# Patient Record
Sex: Male | Born: 1935 | Race: Black or African American | Hispanic: No | Smoking: Never smoker
Health system: Southern US, Community
[De-identification: ages and names within clinical notes are randomized; demographics above are authoritative.]

## PROBLEM LIST (undated history)

## (undated) DIAGNOSIS — E119 Type 2 diabetes mellitus without complications: Secondary | ICD-10-CM

## (undated) DIAGNOSIS — N179 Acute kidney failure, unspecified: Secondary | ICD-10-CM

## (undated) DIAGNOSIS — R531 Weakness: Secondary | ICD-10-CM

## (undated) DIAGNOSIS — I639 Cerebral infarction, unspecified: Secondary | ICD-10-CM

## (undated) DIAGNOSIS — E785 Hyperlipidemia, unspecified: Secondary | ICD-10-CM

## (undated) DIAGNOSIS — I10 Essential (primary) hypertension: Secondary | ICD-10-CM

## (undated) HISTORY — DX: Weakness: R53.1

## (undated) HISTORY — PX: APPENDECTOMY: SHX54

## (undated) HISTORY — DX: Hyperlipidemia, unspecified: E78.5

## (undated) HISTORY — PX: KNEE SURGERY: SHX244

---

## 1998-04-17 ENCOUNTER — Encounter: Payer: Self-pay | Admitting: Emergency Medicine

## 1998-04-17 ENCOUNTER — Emergency Department (HOSPITAL_COMMUNITY): Admission: EM | Admit: 1998-04-17 | Discharge: 1998-04-17 | Payer: Self-pay | Admitting: Emergency Medicine

## 2003-06-14 ENCOUNTER — Emergency Department (HOSPITAL_COMMUNITY): Admission: EM | Admit: 2003-06-14 | Discharge: 2003-06-14 | Payer: Self-pay | Admitting: Emergency Medicine

## 2008-02-06 ENCOUNTER — Encounter: Admission: RE | Admit: 2008-02-06 | Discharge: 2008-02-06 | Payer: Self-pay | Admitting: Physician Assistant

## 2008-02-12 ENCOUNTER — Ambulatory Visit (HOSPITAL_COMMUNITY): Admission: RE | Admit: 2008-02-12 | Discharge: 2008-02-12 | Payer: Self-pay | Admitting: Cardiovascular Disease

## 2009-07-06 ENCOUNTER — Emergency Department (HOSPITAL_COMMUNITY): Admission: EM | Admit: 2009-07-06 | Discharge: 2009-07-06 | Payer: Self-pay | Admitting: Emergency Medicine

## 2010-01-12 ENCOUNTER — Ambulatory Visit: Payer: Self-pay | Admitting: Physical Medicine & Rehabilitation

## 2010-01-15 ENCOUNTER — Inpatient Hospital Stay (HOSPITAL_COMMUNITY): Admission: EM | Admit: 2010-01-15 | Discharge: 2010-01-24 | Payer: Self-pay | Admitting: Emergency Medicine

## 2010-01-17 ENCOUNTER — Encounter (INDEPENDENT_AMBULATORY_CARE_PROVIDER_SITE_OTHER): Payer: Self-pay | Admitting: Emergency Medicine

## 2010-01-21 ENCOUNTER — Ambulatory Visit: Payer: Self-pay | Admitting: Vascular Surgery

## 2010-01-21 ENCOUNTER — Encounter (INDEPENDENT_AMBULATORY_CARE_PROVIDER_SITE_OTHER): Payer: Self-pay | Admitting: Emergency Medicine

## 2010-01-21 ENCOUNTER — Encounter (INDEPENDENT_AMBULATORY_CARE_PROVIDER_SITE_OTHER): Payer: Self-pay | Admitting: Neurology

## 2010-07-07 ENCOUNTER — Encounter
Admission: RE | Admit: 2010-07-07 | Discharge: 2010-07-11 | Payer: Self-pay | Source: Home / Self Care | Attending: Internal Medicine | Admitting: Internal Medicine

## 2010-07-11 ENCOUNTER — Encounter
Admission: RE | Admit: 2010-07-11 | Discharge: 2010-08-09 | Payer: Self-pay | Source: Home / Self Care | Attending: Internal Medicine | Admitting: Internal Medicine

## 2010-07-14 ENCOUNTER — Encounter: Admit: 2010-07-14 | Payer: Self-pay | Admitting: Internal Medicine

## 2010-07-18 ENCOUNTER — Encounter: Admit: 2010-07-18 | Payer: Self-pay | Admitting: Internal Medicine

## 2010-08-04 ENCOUNTER — Encounter: Admission: RE | Admit: 2010-08-04 | Payer: Self-pay | Source: Home / Self Care | Admitting: Internal Medicine

## 2010-08-04 ENCOUNTER — Encounter
Admission: RE | Admit: 2010-08-04 | Discharge: 2010-08-09 | Payer: Self-pay | Source: Home / Self Care | Attending: Internal Medicine | Admitting: Internal Medicine

## 2010-08-10 ENCOUNTER — Ambulatory Visit: Payer: Self-pay | Admitting: Occupational Therapy

## 2010-08-10 ENCOUNTER — Ambulatory Visit: Payer: Medicare PPO | Attending: Internal Medicine | Admitting: Occupational Therapy

## 2010-08-10 ENCOUNTER — Ambulatory Visit: Payer: Medicare PPO | Attending: Internal Medicine | Admitting: Physical Therapy

## 2010-08-10 DIAGNOSIS — IMO0001 Reserved for inherently not codable concepts without codable children: Secondary | ICD-10-CM | POA: Insufficient documentation

## 2010-08-10 DIAGNOSIS — M6281 Muscle weakness (generalized): Secondary | ICD-10-CM | POA: Insufficient documentation

## 2010-08-10 DIAGNOSIS — R279 Unspecified lack of coordination: Secondary | ICD-10-CM | POA: Insufficient documentation

## 2010-08-10 DIAGNOSIS — M25519 Pain in unspecified shoulder: Secondary | ICD-10-CM | POA: Insufficient documentation

## 2010-08-10 DIAGNOSIS — I69998 Other sequelae following unspecified cerebrovascular disease: Secondary | ICD-10-CM | POA: Insufficient documentation

## 2010-08-10 DIAGNOSIS — M2560 Stiffness of unspecified joint, not elsewhere classified: Secondary | ICD-10-CM | POA: Insufficient documentation

## 2010-08-12 ENCOUNTER — Ambulatory Visit: Payer: Medicare PPO | Admitting: Physical Therapy

## 2010-08-15 ENCOUNTER — Ambulatory Visit: Payer: Medicare PPO | Admitting: Physical Therapy

## 2010-08-17 ENCOUNTER — Encounter: Payer: Self-pay | Admitting: Occupational Therapy

## 2010-08-17 ENCOUNTER — Ambulatory Visit: Payer: Medicare PPO | Admitting: Occupational Therapy

## 2010-08-17 ENCOUNTER — Ambulatory Visit: Payer: Medicare PPO | Admitting: Physical Therapy

## 2010-08-22 ENCOUNTER — Encounter: Payer: Self-pay | Admitting: Occupational Therapy

## 2010-08-22 ENCOUNTER — Ambulatory Visit: Payer: Medicare PPO | Admitting: Physical Therapy

## 2010-08-24 ENCOUNTER — Ambulatory Visit: Payer: Medicare PPO | Admitting: Occupational Therapy

## 2010-08-24 ENCOUNTER — Encounter: Payer: Self-pay | Admitting: Occupational Therapy

## 2010-08-24 ENCOUNTER — Ambulatory Visit: Payer: Medicare PPO | Admitting: Physical Therapy

## 2010-08-26 ENCOUNTER — Ambulatory Visit: Payer: Self-pay | Admitting: Physical Therapy

## 2010-08-26 ENCOUNTER — Encounter: Payer: Self-pay | Admitting: Occupational Therapy

## 2010-08-29 ENCOUNTER — Ambulatory Visit: Payer: Medicare PPO | Admitting: Occupational Therapy

## 2010-08-29 ENCOUNTER — Encounter: Payer: Medicare PPO | Admitting: Occupational Therapy

## 2010-08-29 ENCOUNTER — Ambulatory Visit: Payer: Self-pay | Admitting: Physical Therapy

## 2010-08-29 ENCOUNTER — Ambulatory Visit: Payer: Medicare PPO | Admitting: Physical Therapy

## 2010-08-29 ENCOUNTER — Encounter: Payer: Self-pay | Admitting: Occupational Therapy

## 2010-08-31 ENCOUNTER — Ambulatory Visit: Payer: Medicare PPO | Attending: Internal Medicine | Admitting: Physical Therapy

## 2010-08-31 ENCOUNTER — Encounter: Payer: Self-pay | Admitting: Occupational Therapy

## 2010-08-31 ENCOUNTER — Ambulatory Visit: Payer: Medicare PPO | Admitting: Occupational Therapy

## 2010-08-31 DIAGNOSIS — M6281 Muscle weakness (generalized): Secondary | ICD-10-CM | POA: Insufficient documentation

## 2010-08-31 DIAGNOSIS — I69998 Other sequelae following unspecified cerebrovascular disease: Secondary | ICD-10-CM | POA: Insufficient documentation

## 2010-08-31 DIAGNOSIS — M25519 Pain in unspecified shoulder: Secondary | ICD-10-CM | POA: Insufficient documentation

## 2010-08-31 DIAGNOSIS — IMO0001 Reserved for inherently not codable concepts without codable children: Secondary | ICD-10-CM | POA: Insufficient documentation

## 2010-08-31 DIAGNOSIS — M2560 Stiffness of unspecified joint, not elsewhere classified: Secondary | ICD-10-CM | POA: Insufficient documentation

## 2010-08-31 DIAGNOSIS — R279 Unspecified lack of coordination: Secondary | ICD-10-CM | POA: Insufficient documentation

## 2010-09-06 ENCOUNTER — Ambulatory Visit: Payer: Medicare PPO | Admitting: Occupational Therapy

## 2010-09-06 ENCOUNTER — Ambulatory Visit: Payer: Medicare PPO | Admitting: Physical Therapy

## 2010-09-08 ENCOUNTER — Ambulatory Visit: Payer: Medicare PPO | Attending: Internal Medicine | Admitting: Occupational Therapy

## 2010-09-08 ENCOUNTER — Ambulatory Visit: Payer: Medicare PPO | Admitting: Physical Therapy

## 2010-09-08 DIAGNOSIS — IMO0001 Reserved for inherently not codable concepts without codable children: Secondary | ICD-10-CM | POA: Insufficient documentation

## 2010-09-08 DIAGNOSIS — I69998 Other sequelae following unspecified cerebrovascular disease: Secondary | ICD-10-CM | POA: Insufficient documentation

## 2010-09-08 DIAGNOSIS — M2560 Stiffness of unspecified joint, not elsewhere classified: Secondary | ICD-10-CM | POA: Insufficient documentation

## 2010-09-08 DIAGNOSIS — M25519 Pain in unspecified shoulder: Secondary | ICD-10-CM | POA: Insufficient documentation

## 2010-09-08 DIAGNOSIS — R279 Unspecified lack of coordination: Secondary | ICD-10-CM | POA: Insufficient documentation

## 2010-09-08 DIAGNOSIS — M6281 Muscle weakness (generalized): Secondary | ICD-10-CM | POA: Insufficient documentation

## 2010-09-13 ENCOUNTER — Ambulatory Visit: Payer: Medicare PPO | Admitting: Physical Therapy

## 2010-09-13 ENCOUNTER — Ambulatory Visit: Payer: Medicare PPO | Admitting: Occupational Therapy

## 2010-09-15 ENCOUNTER — Ambulatory Visit: Payer: Medicare PPO | Admitting: Physical Therapy

## 2010-09-15 ENCOUNTER — Ambulatory Visit: Payer: Medicare PPO | Admitting: Occupational Therapy

## 2010-09-20 ENCOUNTER — Ambulatory Visit: Payer: Medicare PPO | Admitting: Occupational Therapy

## 2010-09-20 ENCOUNTER — Ambulatory Visit: Payer: Medicare PPO | Admitting: Physical Therapy

## 2010-09-23 ENCOUNTER — Ambulatory Visit: Payer: Medicare PPO | Admitting: Physical Therapy

## 2010-09-23 ENCOUNTER — Ambulatory Visit: Payer: Medicare PPO | Admitting: Occupational Therapy

## 2010-09-24 LAB — GLUCOSE, CAPILLARY
Glucose-Capillary: 119 mg/dL — ABNORMAL HIGH (ref 70–99)
Glucose-Capillary: 124 mg/dL — ABNORMAL HIGH (ref 70–99)
Glucose-Capillary: 125 mg/dL — ABNORMAL HIGH (ref 70–99)
Glucose-Capillary: 149 mg/dL — ABNORMAL HIGH (ref 70–99)
Glucose-Capillary: 170 mg/dL — ABNORMAL HIGH (ref 70–99)
Glucose-Capillary: 82 mg/dL (ref 70–99)
Glucose-Capillary: 96 mg/dL (ref 70–99)
Glucose-Capillary: 97 mg/dL (ref 70–99)

## 2010-09-24 LAB — BASIC METABOLIC PANEL
BUN: 14 mg/dL (ref 6–23)
Calcium: 9.1 mg/dL (ref 8.4–10.5)
GFR calc non Af Amer: 51 mL/min — ABNORMAL LOW (ref >60)
Glucose, Bld: 96 mg/dL (ref 70–99)

## 2010-09-25 LAB — COMPREHENSIVE METABOLIC PANEL
Albumin: 3.4 g/dL — ABNORMAL LOW (ref 3.5–5.2)
Alkaline Phosphatase: 37 U/L — ABNORMAL LOW (ref 39–117)
BUN: 18 mg/dL (ref 6–23)
CO2: 28 mEq/L (ref 19–32)
Chloride: 106 mEq/L (ref 96–112)
GFR calc non Af Amer: 49 mL/min — ABNORMAL LOW (ref >60)
Potassium: 4.1 mEq/L (ref 3.5–5.1)
Total Bilirubin: 1.3 mg/dL — ABNORMAL HIGH (ref 0.3–1.2)

## 2010-09-25 LAB — CBC
HCT: 38.6 % — ABNORMAL LOW (ref 39.0–52.0)
HCT: 39.4 % (ref 39.0–52.0)
HCT: 42.7 % (ref 39.0–52.0)
Hemoglobin: 13.2 g/dL (ref 13.0–17.0)
MCH: 30.9 pg (ref 26.0–34.0)
MCV: 91.9 fL (ref 78.0–100.0)
MCV: 92.1 fL (ref 78.0–100.0)
MCV: 92.2 fL (ref 78.0–100.0)
Platelets: 156 10*3/uL (ref 150–400)
Platelets: 160 10*3/uL (ref 150–400)
Platelets: 161 10*3/uL (ref 150–400)
RBC: 4.27 MIL/uL (ref 4.22–5.81)
RBC: 4.45 MIL/uL (ref 4.22–5.81)
RDW: 13.7 % (ref 11.5–15.5)
RDW: 13.8 % (ref 11.5–15.5)
WBC: 11.4 10*3/uL — ABNORMAL HIGH (ref 4.0–10.5)
WBC: 8.2 10*3/uL (ref 4.0–10.5)
WBC: 9.4 10*3/uL (ref 4.0–10.5)

## 2010-09-25 LAB — CK
Total CK: 1059 U/L — ABNORMAL HIGH (ref 7–232)
Total CK: 493 U/L — ABNORMAL HIGH (ref 7–232)
Total CK: 845 U/L — ABNORMAL HIGH (ref 7–232)

## 2010-09-25 LAB — BASIC METABOLIC PANEL
BUN: 13 mg/dL (ref 6–23)
CO2: 29 mEq/L (ref 19–32)
Calcium: 8.9 mg/dL (ref 8.4–10.5)
Chloride: 107 mEq/L (ref 96–112)
Creatinine, Ser: 1.34 mg/dL (ref 0.4–1.5)
Creatinine, Ser: 1.55 mg/dL — ABNORMAL HIGH (ref 0.4–1.5)
GFR calc Af Amer: >60 mL/min (ref >60)
GFR calc non Af Amer: 43 mL/min — ABNORMAL LOW (ref >60)
GFR calc non Af Amer: 51 mL/min — ABNORMAL LOW (ref >60)
Potassium: 3.9 mEq/L (ref 3.5–5.1)
Potassium: 4.1 mEq/L (ref 3.5–5.1)
Sodium: 138 mEq/L (ref 135–145)

## 2010-09-25 LAB — URINALYSIS, ROUTINE W REFLEX MICROSCOPIC
Glucose, UA: NEGATIVE mg/dL
Nitrite: NEGATIVE
Nitrite: NEGATIVE
Specific Gravity, Urine: 1.016 (ref 1.005–1.030)
Specific Gravity, Urine: 1.021 (ref 1.005–1.030)
Urobilinogen, UA: 1 mg/dL (ref 0.0–1.0)
pH: 6.5 (ref 5.0–8.0)
pH: 8.5 — ABNORMAL HIGH (ref 5.0–8.0)

## 2010-09-25 LAB — GLUCOSE, CAPILLARY
Glucose-Capillary: 108 mg/dL — ABNORMAL HIGH (ref 70–99)
Glucose-Capillary: 111 mg/dL — ABNORMAL HIGH (ref 70–99)
Glucose-Capillary: 116 mg/dL — ABNORMAL HIGH (ref 70–99)
Glucose-Capillary: 136 mg/dL — ABNORMAL HIGH (ref 70–99)
Glucose-Capillary: 143 mg/dL — ABNORMAL HIGH (ref 70–99)
Glucose-Capillary: 144 mg/dL — ABNORMAL HIGH (ref 70–99)
Glucose-Capillary: 147 mg/dL — ABNORMAL HIGH (ref 70–99)
Glucose-Capillary: 165 mg/dL — ABNORMAL HIGH (ref 70–99)
Glucose-Capillary: 81 mg/dL (ref 70–99)
Glucose-Capillary: 82 mg/dL (ref 70–99)
Glucose-Capillary: 94 mg/dL (ref 70–99)
Glucose-Capillary: 99 mg/dL (ref 70–99)

## 2010-09-25 LAB — HEMOGLOBIN A1C
Mean Plasma Glucose: 123 mg/dL — ABNORMAL HIGH (ref <117)
Mean Plasma Glucose: 123 mg/dL — ABNORMAL HIGH (ref <117)

## 2010-09-25 LAB — URINE MICROSCOPIC-ADD ON

## 2010-09-25 LAB — URINE CULTURE
Colony Count: NO GROWTH
Culture: NO GROWTH

## 2010-09-25 LAB — LIPID PANEL: VLDL: 11 mg/dL (ref 0–40)

## 2010-09-26 ENCOUNTER — Ambulatory Visit: Payer: Medicare PPO | Admitting: Occupational Therapy

## 2010-09-26 ENCOUNTER — Ambulatory Visit: Payer: Medicare PPO | Admitting: Physical Therapy

## 2010-09-28 ENCOUNTER — Encounter: Payer: Medicare PPO | Admitting: Occupational Therapy

## 2010-09-28 ENCOUNTER — Ambulatory Visit: Payer: Medicare PPO | Admitting: Physical Therapy

## 2010-11-22 NOTE — Cardiovascular Report (Signed)
NAME:  Mark Bullock, Mark Bullock NO.:  [id number]   MEDICAL RECORD NO.:  [id number]          PATIENT TYPE:  OIB   LOCATION:  2899                         FACILITY:  [facility]   PHYSICIAN:  Nanetta Batty, M.D.   DATE OF BIRTH:  01/26/1929   DATE OF PROCEDURE:  DATE OF DISCHARGE:  02/12/2008                            CARDIAC CATHETERIZATION   [name] is a 77 year old mildly overweight African American male  referred to me because of an abnormal EKG.  He had an episode of chest  pain in the past.  His risk factors are positive for diabetes and  hypertension.  He had a Myoview that showed inferior scar with low EF.  Because of this fever, he presents for right and left heart cath to  define his anatomy, physiology and rule out ischemic etiology.   DESCRIPTION OF PROCEDURE:  The patient was brought to the Second Floor  [facility] Cardiac Cath Lab in postabsorptive state.  He was not  premedicated.  His right groin was prepped and shaved in the usual  sterile fashion.  Xylocaine 1% was used for local anesthesia.  A 7-  French sheath was inserted into the right femoral vein using standard  Seldinger technique.  A 6-French sheath was inserted into the right  femoral artery.  A 7-French balloon-tip thermodilution Swan-Ganz  catheter was used to obtain sequential right heart pressures.  A 6-  French right and left Judkins diagnostic catheter as well as 6-French  pigtail catheter were used for selective coronary angiography and left  ventriculography respectively.  Visipaque dye was used for the entirety  of the case.  Retrograde aorta, left ventricular and pullback pressures  were recorded.   HEMODYNAMICS:  1. Aortic systolic pressure 105, diastolic pressure 68.  2. Left ventricular systolic pressure was 9, end-diastolic pressure      11.  3. RA pressure, A-wave 6, V-wave 5, mean 3.  RV pressure, systolic 21,      end-diastolic pressure 1.  Pulmonary artery pressure,  systolic 25,      diastolic pressure 16, mean 21.  Pulmonary capillary wedge pressure      A-wave 17, V-wave 15, mean 15.  4. Selective coronary angiography.      a.     Left main normal.      b.     LAD normal.      c.     Left circumflex normal.      d.     Ramus intermedius branch normal.      e.     Right coronary artery was dominant normal.  5. Left ventriculography; RAO left ventriculogram was performed using      25 mL of Visipaque dye at 12 mL per second.  The overall LVEF was      estimated greater than 55% without focal wall motion abnormalities.   IMPRESSION:  [name] has essentially normal coronary arteries,  normal LV function, and normal filling pressures.  I believe his Myoview  is false positive and his abnormal EKG was a normal variant.  He has no  physiologic reason to have lower  extremity edema.  This should be  treated medically.  Sheaths were removed and  pressure was held on the groin to achieve hemostasis.  The patient left  the lab in stable condition.  He will be gently hydrated, discharged  home later today as an outpatient and will see me back in the office in  1 or 2 weeks for followup.  He left the lab in stable condition.      Nanetta Batty, M.D.  Electronically Signed     JB/MEDQ  D:  02/12/2008  T:  02/13/2008  Job:  [id number]   cc:   Second Floor [place] [facility]iac Cath Lab  [facility]  Fleet Contras, M.D.

## 2011-04-07 LAB — BASIC METABOLIC PANEL
Calcium: 9.4
Creatinine, Ser: 1.39
GFR calc Af Amer: 60 — ABNORMAL LOW

## 2011-09-05 ENCOUNTER — Other Ambulatory Visit (HOSPITAL_COMMUNITY): Payer: Self-pay | Admitting: General Surgery

## 2011-09-05 DIAGNOSIS — N433 Hydrocele, unspecified: Secondary | ICD-10-CM

## 2011-09-11 ENCOUNTER — Ambulatory Visit (HOSPITAL_COMMUNITY)
Admission: RE | Admit: 2011-09-11 | Discharge: 2011-09-11 | Disposition: A | Discharge status: Home / Self Care | Payer: Medicare PPO | Source: Ambulatory Visit | Attending: General Surgery | Admitting: General Surgery

## 2011-09-11 DIAGNOSIS — N433 Hydrocele, unspecified: Secondary | ICD-10-CM | POA: Insufficient documentation

## 2012-07-24 ENCOUNTER — Other Ambulatory Visit: Payer: Self-pay | Admitting: Internal Medicine

## 2012-07-24 ENCOUNTER — Ambulatory Visit
Admission: RE | Admit: 2012-07-24 | Discharge: 2012-07-24 | Disposition: A | Discharge status: Home / Self Care | Payer: Medicare PPO | Source: Ambulatory Visit | Attending: Internal Medicine | Admitting: Internal Medicine

## 2012-07-24 DIAGNOSIS — R52 Pain, unspecified: Secondary | ICD-10-CM

## 2013-09-01 ENCOUNTER — Other Ambulatory Visit: Payer: Self-pay | Admitting: Adult Health

## 2013-09-08 ENCOUNTER — Other Ambulatory Visit: Payer: Self-pay | Admitting: Adult Health

## 2013-10-09 ENCOUNTER — Other Ambulatory Visit: Payer: Self-pay | Admitting: Adult Health

## 2013-10-20 ENCOUNTER — Other Ambulatory Visit: Payer: Self-pay | Admitting: Adult Health

## 2013-12-06 ENCOUNTER — Other Ambulatory Visit: Payer: Self-pay | Admitting: Adult Health

## 2014-07-23 ENCOUNTER — Other Ambulatory Visit: Payer: Self-pay | Admitting: Internal Medicine

## 2014-07-23 ENCOUNTER — Ambulatory Visit
Admission: RE | Admit: 2014-07-23 | Discharge: 2014-07-23 | Disposition: A | Discharge status: Home / Self Care | Payer: Commercial Managed Care - HMO | Source: Ambulatory Visit | Attending: Internal Medicine | Admitting: Internal Medicine

## 2014-07-23 DIAGNOSIS — R059 Cough, unspecified: Secondary | ICD-10-CM

## 2014-07-23 DIAGNOSIS — R05 Cough: Secondary | ICD-10-CM

## 2015-03-04 ENCOUNTER — Emergency Department (HOSPITAL_COMMUNITY)
Admission: EM | Admit: 2015-03-04 | Discharge: 2015-03-04 | Disposition: A | Discharge status: Home / Self Care | Payer: Medicare HMO | Attending: Emergency Medicine | Admitting: Emergency Medicine

## 2015-03-04 ENCOUNTER — Encounter (HOSPITAL_COMMUNITY): Payer: Self-pay | Admitting: Family Medicine

## 2015-03-04 DIAGNOSIS — L97919 Non-pressure chronic ulcer of unspecified part of right lower leg with unspecified severity: Secondary | ICD-10-CM

## 2015-03-04 DIAGNOSIS — L97829 Non-pressure chronic ulcer of other part of left lower leg with unspecified severity: Secondary | ICD-10-CM | POA: Insufficient documentation

## 2015-03-04 DIAGNOSIS — L97819 Non-pressure chronic ulcer of other part of right lower leg with unspecified severity: Secondary | ICD-10-CM | POA: Insufficient documentation

## 2015-03-04 DIAGNOSIS — Z8673 Personal history of transient ischemic attack (TIA), and cerebral infarction without residual deficits: Secondary | ICD-10-CM | POA: Insufficient documentation

## 2015-03-04 DIAGNOSIS — R2243 Localized swelling, mass and lump, lower limb, bilateral: Secondary | ICD-10-CM | POA: Diagnosis present

## 2015-03-04 DIAGNOSIS — E119 Type 2 diabetes mellitus without complications: Secondary | ICD-10-CM | POA: Insufficient documentation

## 2015-03-04 DIAGNOSIS — I10 Essential (primary) hypertension: Secondary | ICD-10-CM | POA: Insufficient documentation

## 2015-03-04 DIAGNOSIS — I83008 Varicose veins of unspecified lower extremity with ulcer other part of lower leg: Secondary | ICD-10-CM | POA: Insufficient documentation

## 2015-03-04 DIAGNOSIS — I83019 Varicose veins of right lower extremity with ulcer of unspecified site: Secondary | ICD-10-CM

## 2015-03-04 DIAGNOSIS — I83029 Varicose veins of left lower extremity with ulcer of unspecified site: Secondary | ICD-10-CM

## 2015-03-04 DIAGNOSIS — Z7902 Long term (current) use of antithrombotics/antiplatelets: Secondary | ICD-10-CM | POA: Insufficient documentation

## 2015-03-04 DIAGNOSIS — Z79899 Other long term (current) drug therapy: Secondary | ICD-10-CM | POA: Insufficient documentation

## 2015-03-04 DIAGNOSIS — L97929 Non-pressure chronic ulcer of unspecified part of left lower leg with unspecified severity: Secondary | ICD-10-CM

## 2015-03-04 HISTORY — DX: Type 2 diabetes mellitus without complications: E11.9

## 2015-03-04 HISTORY — DX: Essential (primary) hypertension: I10

## 2015-03-04 HISTORY — DX: Cerebral infarction, unspecified: I63.9

## 2015-03-04 MED ORDER — DOXYCYCLINE HYCLATE 100 MG PO CAPS: 100.0000 mg | ORAL_CAPSULE | Freq: Two times a day (BID) | ORAL | Status: DC

## 2015-03-04 MED ORDER — DOXYCYCLINE HYCLATE 100 MG PO TABS
100.0000 mg | ORAL_TABLET | Freq: Once | ORAL | Status: AC
  Administered 2015-03-04: 100 mg via ORAL
  Filled 2015-03-04: qty 1

## 2015-03-04 NOTE — ED Notes (Signed)
Pt here for right leg swelling and wound to leg.

## 2015-03-04 NOTE — ED Provider Notes (Signed)
CSN: [id number]     Arrival date & time 03/04/15  0917 History   First MD Initiated Contact with Patient 03/04/15 0957     Chief Complaint  Patient presents with  . Leg Swelling     (Consider location/radiation/quality/duration/timing/severity/associated sxs/prior Treatment) HPI.... Patient presents with swelling and inflammation of his bilateral legs for several months, right greater than left. Both legs are erythematous and scaly. The right leg has an ulceration and some weeping. No fever or chills. This is a long-standing problem. He has a primary care doctor, Dr. Burton Apley. Severity is moderate.  Past Medical History  Diagnosis Date  . Diabetes mellitus without complication   . Hypertension   . Stroke    History reviewed. No pertinent past surgical history. History reviewed. No pertinent family history. Social History  Substance Use Topics  . Smoking status: Never Smoker   . Smokeless tobacco: None  . Alcohol Use: No    Review of Systems    Allergies  Review of patient's allergies indicates no known allergies.  Home Medications   Prior to Admission medications   Medication Sig Start Date End Date Taking? Authorizing Provider  clopidogrel (PLAVIX) 75 MG tablet Take 75 mg by mouth daily. 01/22/15  Yes Historical Provider, MD  furosemide (LASIX) 40 MG tablet Take 40 mg by mouth daily. 01/22/15  Yes Historical Provider, MD  glimepiride (AMARYL) 1 MG tablet Take 1 mg by mouth daily. 01/22/15  Yes Historical Provider, MD  KLOR-CON M10 10 MEQ tablet Take 10 mEq by mouth daily. 01/22/15  Yes Historical Provider, MD  lisinopril (PRINIVIL,ZESTRIL) 5 MG tablet Take 5 mg by mouth daily. 01/22/15  Yes Historical Provider, MD  simvastatin (ZOCOR) 20 MG tablet Take 20 mg by mouth daily. 01/22/15  Yes Historical Provider, MD  vitamin B-12 (CYANOCOBALAMIN) 100 MCG tablet Take 100 mcg by mouth daily.   Yes Historical Provider, MD  doxycycline (VIBRAMYCIN) 100 MG capsule Take 1 capsule  (100 mg total) by mouth 2 (two) times daily. 03/04/15   Donnetta Hutching, MD   BP 129/79 mmHg  Pulse 76  Temp(Src) 97.9 F (36.6 C) (Oral)  Resp 18  SpO2 97% Physical Exam  Constitutional: He is oriented to person, place, and time. He appears well-developed and well-nourished.  HENT:  Head: Normocephalic and atraumatic.  Eyes: Conjunctivae and EOM are normal. Pupils are equal, round, and reactive to light.  Neck: Normal range of motion. Neck supple.  Cardiovascular: Normal rate and regular rhythm.   Pulmonary/Chest: Effort normal and breath sounds normal.  Abdominal: Soft. Bowel sounds are normal.  Musculoskeletal: Normal range of motion.  Neurological: He is alert and oriented to person, place, and time.  Skin:  Bilateral lower extremities: Legs are puffy, erythematous, scaly with weeping of the right leg. There is an ulceration on the right posterior medial mid calf area approximately 2.5 cm in diameter  Psychiatric: He has a normal mood and affect. His behavior is normal.  Nursing note and vitals reviewed.   ED Course  Procedures (including critical care time) Labs Review Labs Reviewed - No data to display  Imaging Review No results found. I have personally reviewed and evaluated these images and lab results as part of my medical decision-making.   EKG Interpretation None      MDM   Final diagnoses:  Venous stasis ulcers of both lower extremities    Patient has obvious venous stasis ulcers. There may be a small cellulitic component. Rx doxycycline 100 mg twice a  day. I made an appointment for the patient at the [facility] wound care center for September 12 at 9 AM    Donnetta Hutching, MD 03/04/15 1112

## 2015-03-04 NOTE — Discharge Instructions (Signed)
Stasis Ulcer  A stasis ulcer is a sore on the skin. It occurs in the legs when your blood flow is damaged.  HOME CARE  Do not stand or sit in one position for a long time. Do not sit with your legs crossed. Raise (elevate) your legs.  Wear elastic stockings (compression stockings). Do not wear tight clothing around the legs or waist area. Use and apply bandages (dressings) as told.  Walk as much as possible. If you take long car or plane rides, take a break to walk around every 2 hours.  Only take medicine as told by your doctor.  Raise the end of your bed with 2-inch blocks only if your doctor says it is okay.  If you cut the skin on your leg, lie down and raise your leg. Gently clean the area with a clean cloth. Then, put pressure on the cut until the bleeding stops. Put a bandage on.  Keep all doctor visits. GET HELP RIGHT AWAY IF:  The ulcer area starts to break down.  You have pain, redness, or tenderness in or around the ulcer.  You have yellowish-white fluid (pus) or hard puffiness (swelling) in or around the ulcer.  Your pain gets worse.  You get a fever.  You have chest pain or shortness of breath. MAKE SURE YOU:  Understand these instructions.  Will watch your condition.  Will get help right away if you are not doing well or get worse. Document Released: 08/03/2004 Document Revised: 10/21/2012 Document Reviewed: 10/25/2010 [facility] Patient Information 2015 [place], [place]. This information is not intended to replace advice given to you by your health care provider. Make sure you discuss any questions you have with your health care provider.   You have a condition called venous stasis ulcers. Prescription for antibiotic. I've made an appointment for you at the Wound Care Center at [facility] for September 12. Please call the number provided to confirm your appointment.

## 2015-03-22 ENCOUNTER — Encounter (HOSPITAL_BASED_OUTPATIENT_CLINIC_OR_DEPARTMENT_OTHER): Payer: Medicare HMO | Attending: Plastic Surgery

## 2015-03-22 ENCOUNTER — Other Ambulatory Visit (HOSPITAL_COMMUNITY)
Admission: RE | Admit: 2015-03-22 | Discharge: 2015-03-22 | Disposition: A | Discharge status: Home / Self Care | Payer: Commercial Managed Care - HMO | Source: Ambulatory Visit | Attending: Plastic Surgery | Admitting: Plastic Surgery

## 2015-03-22 DIAGNOSIS — I872 Venous insufficiency (chronic) (peripheral): Secondary | ICD-10-CM | POA: Diagnosis not present

## 2015-03-22 DIAGNOSIS — I10 Essential (primary) hypertension: Secondary | ICD-10-CM | POA: Insufficient documentation

## 2015-03-22 DIAGNOSIS — E1141 Type 2 diabetes mellitus with diabetic mononeuropathy: Secondary | ICD-10-CM | POA: Diagnosis not present

## 2015-03-22 DIAGNOSIS — Z0289 Encounter for other administrative examinations: Secondary | ICD-10-CM | POA: Diagnosis present

## 2015-03-22 DIAGNOSIS — L97211 Non-pressure chronic ulcer of right calf limited to breakdown of skin: Secondary | ICD-10-CM | POA: Diagnosis present

## 2015-03-22 DIAGNOSIS — I890 Lymphedema, not elsewhere classified: Secondary | ICD-10-CM | POA: Diagnosis not present

## 2015-03-22 LAB — PREALBUMIN: PREALBUMIN: 30.2 mg/dL (ref 18–38)

## 2015-03-23 LAB — HEMOGLOBIN A1C
Hgb A1c MFr Bld: 5.7 % — ABNORMAL HIGH (ref 4.8–5.6)
Mean Plasma Glucose: 117 mg/dL

## 2015-03-29 DIAGNOSIS — I872 Venous insufficiency (chronic) (peripheral): Secondary | ICD-10-CM | POA: Diagnosis not present

## 2015-03-29 DIAGNOSIS — E1141 Type 2 diabetes mellitus with diabetic mononeuropathy: Secondary | ICD-10-CM | POA: Diagnosis not present

## 2015-03-29 DIAGNOSIS — L97211 Non-pressure chronic ulcer of right calf limited to breakdown of skin: Secondary | ICD-10-CM | POA: Diagnosis not present

## 2015-03-29 DIAGNOSIS — I890 Lymphedema, not elsewhere classified: Secondary | ICD-10-CM | POA: Diagnosis not present

## 2015-04-05 DIAGNOSIS — L97211 Non-pressure chronic ulcer of right calf limited to breakdown of skin: Secondary | ICD-10-CM | POA: Diagnosis not present

## 2015-04-05 DIAGNOSIS — I872 Venous insufficiency (chronic) (peripheral): Secondary | ICD-10-CM | POA: Diagnosis not present

## 2015-04-05 DIAGNOSIS — I890 Lymphedema, not elsewhere classified: Secondary | ICD-10-CM | POA: Diagnosis not present

## 2015-04-05 DIAGNOSIS — E1141 Type 2 diabetes mellitus with diabetic mononeuropathy: Secondary | ICD-10-CM | POA: Diagnosis not present

## 2015-04-07 ENCOUNTER — Encounter (HOSPITAL_COMMUNITY): Payer: Commercial Managed Care - HMO

## 2015-04-12 ENCOUNTER — Encounter (HOSPITAL_BASED_OUTPATIENT_CLINIC_OR_DEPARTMENT_OTHER): Payer: Medicare HMO | Attending: Plastic Surgery

## 2015-04-12 DIAGNOSIS — E1151 Type 2 diabetes mellitus with diabetic peripheral angiopathy without gangrene: Secondary | ICD-10-CM | POA: Insufficient documentation

## 2015-04-12 DIAGNOSIS — E11622 Type 2 diabetes mellitus with other skin ulcer: Secondary | ICD-10-CM | POA: Diagnosis present

## 2015-04-12 DIAGNOSIS — Z8673 Personal history of transient ischemic attack (TIA), and cerebral infarction without residual deficits: Secondary | ICD-10-CM | POA: Insufficient documentation

## 2015-04-12 DIAGNOSIS — I890 Lymphedema, not elsewhere classified: Secondary | ICD-10-CM | POA: Diagnosis not present

## 2015-04-12 DIAGNOSIS — I10 Essential (primary) hypertension: Secondary | ICD-10-CM | POA: Diagnosis not present

## 2015-04-12 DIAGNOSIS — L97811 Non-pressure chronic ulcer of other part of right lower leg limited to breakdown of skin: Secondary | ICD-10-CM | POA: Insufficient documentation

## 2015-04-19 ENCOUNTER — Ambulatory Visit (HOSPITAL_COMMUNITY)
Admission: RE | Admit: 2015-04-19 | Discharge: 2015-04-19 | Disposition: A | Discharge status: Home / Self Care | Payer: Medicare HMO | Source: Ambulatory Visit | Attending: Surgery | Admitting: Surgery

## 2015-04-19 ENCOUNTER — Other Ambulatory Visit (HOSPITAL_COMMUNITY): Payer: Self-pay | Admitting: Plastic Surgery

## 2015-04-19 ENCOUNTER — Ambulatory Visit (INDEPENDENT_AMBULATORY_CARE_PROVIDER_SITE_OTHER)
Admission: RE | Admit: 2015-04-19 | Discharge: 2015-04-19 | Disposition: A | Discharge status: Home / Self Care | Payer: Medicare HMO | Source: Ambulatory Visit | Attending: Surgery | Admitting: Surgery

## 2015-04-19 ENCOUNTER — Encounter (HOSPITAL_BASED_OUTPATIENT_CLINIC_OR_DEPARTMENT_OTHER): Payer: Medicare HMO

## 2015-04-19 DIAGNOSIS — L97211 Non-pressure chronic ulcer of right calf limited to breakdown of skin: Secondary | ICD-10-CM

## 2015-04-19 DIAGNOSIS — I872 Venous insufficiency (chronic) (peripheral): Secondary | ICD-10-CM

## 2015-04-19 DIAGNOSIS — E11622 Type 2 diabetes mellitus with other skin ulcer: Secondary | ICD-10-CM | POA: Insufficient documentation

## 2015-04-19 DIAGNOSIS — I8391 Asymptomatic varicose veins of right lower extremity: Secondary | ICD-10-CM | POA: Diagnosis not present

## 2015-04-19 DIAGNOSIS — I10 Essential (primary) hypertension: Secondary | ICD-10-CM | POA: Diagnosis not present

## 2015-04-19 DIAGNOSIS — L97811 Non-pressure chronic ulcer of other part of right lower leg limited to breakdown of skin: Secondary | ICD-10-CM | POA: Diagnosis not present

## 2015-04-19 DIAGNOSIS — Z8673 Personal history of transient ischemic attack (TIA), and cerebral infarction without residual deficits: Secondary | ICD-10-CM | POA: Diagnosis not present

## 2015-04-19 DIAGNOSIS — E1151 Type 2 diabetes mellitus with diabetic peripheral angiopathy without gangrene: Secondary | ICD-10-CM | POA: Diagnosis not present

## 2015-05-20 ENCOUNTER — Telehealth (HOSPITAL_BASED_OUTPATIENT_CLINIC_OR_DEPARTMENT_OTHER): Payer: Self-pay | Admitting: *Deleted

## 2016-05-09 ENCOUNTER — Encounter (HOSPITAL_BASED_OUTPATIENT_CLINIC_OR_DEPARTMENT_OTHER): Payer: Medicare HMO | Attending: Surgery

## 2016-05-09 DIAGNOSIS — I890 Lymphedema, not elsewhere classified: Secondary | ICD-10-CM | POA: Insufficient documentation

## 2016-05-09 DIAGNOSIS — Z79899 Other long term (current) drug therapy: Secondary | ICD-10-CM | POA: Diagnosis not present

## 2016-05-09 DIAGNOSIS — L97821 Non-pressure chronic ulcer of other part of left lower leg limited to breakdown of skin: Secondary | ICD-10-CM | POA: Insufficient documentation

## 2016-05-09 DIAGNOSIS — L97811 Non-pressure chronic ulcer of other part of right lower leg limited to breakdown of skin: Secondary | ICD-10-CM | POA: Insufficient documentation

## 2016-05-09 DIAGNOSIS — Z7902 Long term (current) use of antithrombotics/antiplatelets: Secondary | ICD-10-CM | POA: Diagnosis not present

## 2016-05-09 DIAGNOSIS — Z8673 Personal history of transient ischemic attack (TIA), and cerebral infarction without residual deficits: Secondary | ICD-10-CM | POA: Diagnosis not present

## 2016-05-09 DIAGNOSIS — Z7984 Long term (current) use of oral hypoglycemic drugs: Secondary | ICD-10-CM | POA: Insufficient documentation

## 2016-05-09 DIAGNOSIS — E11622 Type 2 diabetes mellitus with other skin ulcer: Secondary | ICD-10-CM | POA: Diagnosis not present

## 2016-05-09 DIAGNOSIS — I10 Essential (primary) hypertension: Secondary | ICD-10-CM | POA: Diagnosis not present

## 2016-05-09 DIAGNOSIS — E538 Deficiency of other specified B group vitamins: Secondary | ICD-10-CM | POA: Diagnosis not present

## 2016-05-16 ENCOUNTER — Encounter (HOSPITAL_BASED_OUTPATIENT_CLINIC_OR_DEPARTMENT_OTHER): Payer: Medicare HMO | Attending: Surgery

## 2016-05-16 DIAGNOSIS — L97222 Non-pressure chronic ulcer of left calf with fat layer exposed: Secondary | ICD-10-CM | POA: Diagnosis not present

## 2016-05-16 DIAGNOSIS — I890 Lymphedema, not elsewhere classified: Secondary | ICD-10-CM | POA: Diagnosis not present

## 2016-05-16 DIAGNOSIS — I10 Essential (primary) hypertension: Secondary | ICD-10-CM | POA: Diagnosis not present

## 2016-05-16 DIAGNOSIS — L97212 Non-pressure chronic ulcer of right calf with fat layer exposed: Secondary | ICD-10-CM | POA: Diagnosis not present

## 2016-05-16 DIAGNOSIS — E11622 Type 2 diabetes mellitus with other skin ulcer: Secondary | ICD-10-CM | POA: Diagnosis not present

## 2016-05-16 DIAGNOSIS — I87333 Chronic venous hypertension (idiopathic) with ulcer and inflammation of bilateral lower extremity: Secondary | ICD-10-CM | POA: Insufficient documentation

## 2016-05-16 DIAGNOSIS — Z8673 Personal history of transient ischemic attack (TIA), and cerebral infarction without residual deficits: Secondary | ICD-10-CM | POA: Diagnosis not present

## 2016-05-23 DIAGNOSIS — E11622 Type 2 diabetes mellitus with other skin ulcer: Secondary | ICD-10-CM | POA: Diagnosis not present

## 2016-05-30 DIAGNOSIS — E11622 Type 2 diabetes mellitus with other skin ulcer: Secondary | ICD-10-CM | POA: Diagnosis not present

## 2016-06-13 ENCOUNTER — Encounter (HOSPITAL_BASED_OUTPATIENT_CLINIC_OR_DEPARTMENT_OTHER): Payer: Medicare HMO | Attending: Surgery

## 2016-06-13 DIAGNOSIS — L97821 Non-pressure chronic ulcer of other part of left lower leg limited to breakdown of skin: Secondary | ICD-10-CM | POA: Diagnosis not present

## 2016-06-13 DIAGNOSIS — I10 Essential (primary) hypertension: Secondary | ICD-10-CM | POA: Insufficient documentation

## 2016-06-13 DIAGNOSIS — I890 Lymphedema, not elsewhere classified: Secondary | ICD-10-CM | POA: Insufficient documentation

## 2016-06-13 DIAGNOSIS — I87333 Chronic venous hypertension (idiopathic) with ulcer and inflammation of bilateral lower extremity: Secondary | ICD-10-CM | POA: Insufficient documentation

## 2016-06-13 DIAGNOSIS — E11622 Type 2 diabetes mellitus with other skin ulcer: Secondary | ICD-10-CM | POA: Diagnosis present

## 2016-06-13 DIAGNOSIS — L97811 Non-pressure chronic ulcer of other part of right lower leg limited to breakdown of skin: Secondary | ICD-10-CM | POA: Diagnosis not present

## 2016-06-20 DIAGNOSIS — E11622 Type 2 diabetes mellitus with other skin ulcer: Secondary | ICD-10-CM | POA: Diagnosis not present

## 2016-06-27 DIAGNOSIS — E11622 Type 2 diabetes mellitus with other skin ulcer: Secondary | ICD-10-CM | POA: Diagnosis not present

## 2016-07-04 DIAGNOSIS — E11622 Type 2 diabetes mellitus with other skin ulcer: Secondary | ICD-10-CM | POA: Diagnosis not present

## 2016-07-11 ENCOUNTER — Encounter (HOSPITAL_BASED_OUTPATIENT_CLINIC_OR_DEPARTMENT_OTHER): Payer: Medicare HMO | Attending: Surgery

## 2016-07-11 DIAGNOSIS — I890 Lymphedema, not elsewhere classified: Secondary | ICD-10-CM | POA: Diagnosis not present

## 2016-07-11 DIAGNOSIS — Z8631 Personal history of diabetic foot ulcer: Secondary | ICD-10-CM | POA: Insufficient documentation

## 2016-07-11 DIAGNOSIS — E1151 Type 2 diabetes mellitus with diabetic peripheral angiopathy without gangrene: Secondary | ICD-10-CM | POA: Diagnosis not present

## 2016-07-11 DIAGNOSIS — I10 Essential (primary) hypertension: Secondary | ICD-10-CM | POA: Insufficient documentation

## 2016-07-11 DIAGNOSIS — Z8673 Personal history of transient ischemic attack (TIA), and cerebral infarction without residual deficits: Secondary | ICD-10-CM | POA: Diagnosis not present

## 2016-07-11 DIAGNOSIS — Z09 Encounter for follow-up examination after completed treatment for conditions other than malignant neoplasm: Secondary | ICD-10-CM | POA: Insufficient documentation

## 2017-05-07 ENCOUNTER — Emergency Department (HOSPITAL_COMMUNITY): Payer: Medicare HMO

## 2017-05-07 ENCOUNTER — Encounter (HOSPITAL_COMMUNITY): Payer: Self-pay

## 2017-05-07 ENCOUNTER — Emergency Department (HOSPITAL_COMMUNITY)
Admission: EM | Admit: 2017-05-07 | Discharge: 2017-05-08 | Disposition: A | Discharge status: Home / Self Care | Payer: Medicare HMO | Attending: Emergency Medicine | Admitting: Emergency Medicine

## 2017-05-07 DIAGNOSIS — R600 Localized edema: Secondary | ICD-10-CM | POA: Insufficient documentation

## 2017-05-07 DIAGNOSIS — R5381 Other malaise: Secondary | ICD-10-CM

## 2017-05-07 DIAGNOSIS — E119 Type 2 diabetes mellitus without complications: Secondary | ICD-10-CM | POA: Insufficient documentation

## 2017-05-07 DIAGNOSIS — R61 Generalized hyperhidrosis: Secondary | ICD-10-CM

## 2017-05-07 DIAGNOSIS — M1990 Unspecified osteoarthritis, unspecified site: Secondary | ICD-10-CM | POA: Diagnosis not present

## 2017-05-07 DIAGNOSIS — I10 Essential (primary) hypertension: Secondary | ICD-10-CM | POA: Insufficient documentation

## 2017-05-07 DIAGNOSIS — Z7902 Long term (current) use of antithrombotics/antiplatelets: Secondary | ICD-10-CM | POA: Insufficient documentation

## 2017-05-07 DIAGNOSIS — Z79899 Other long term (current) drug therapy: Secondary | ICD-10-CM | POA: Insufficient documentation

## 2017-05-07 DIAGNOSIS — Z8673 Personal history of transient ischemic attack (TIA), and cerebral infarction without residual deficits: Secondary | ICD-10-CM | POA: Insufficient documentation

## 2017-05-07 DIAGNOSIS — R609 Edema, unspecified: Secondary | ICD-10-CM

## 2017-05-07 LAB — CBC WITH DIFFERENTIAL/PLATELET
Basophils Absolute: 0 10*3/uL (ref 0.0–0.1)
Basophils Relative: 1 %
Eosinophils Absolute: 0.2 10*3/uL (ref 0.0–0.7)
Eosinophils Relative: 2 %
HCT: 41.1 % (ref 39.0–52.0)
Hemoglobin: 13.3 g/dL (ref 13.0–17.0)
Lymphocytes Relative: 23 %
Lymphs Abs: 1.8 10*3/uL (ref 0.7–4.0)
MCH: 28.9 pg (ref 26.0–34.0)
MCHC: 32.4 g/dL (ref 30.0–36.0)
MCV: 89.3 fL (ref 78.0–100.0)
Monocytes Absolute: 0.7 10*3/uL (ref 0.1–1.0)
Monocytes Relative: 9 %
Neutro Abs: 5.2 10*3/uL (ref 1.7–7.7)
Neutrophils Relative %: 65 %
Platelets: 181 10*3/uL (ref 150–400)
RBC: 4.6 MIL/uL (ref 4.22–5.81)
RDW: 14.4 % (ref 11.5–15.5)
WBC: 8 10*3/uL (ref 4.0–10.5)

## 2017-05-07 LAB — BASIC METABOLIC PANEL
Anion gap: 8 (ref 5–15)
BUN: 11 mg/dL (ref 6–20)
CO2: 28 mmol/L (ref 22–32)
Calcium: 9.3 mg/dL (ref 8.9–10.3)
Chloride: 103 mmol/L (ref 101–111)
Creatinine, Ser: 1.38 mg/dL — ABNORMAL HIGH (ref 0.61–1.24)
GFR calc Af Amer: 52 mL/min — ABNORMAL LOW (ref >60)
GFR calc non Af Amer: 45 mL/min — ABNORMAL LOW (ref >60)
Glucose, Bld: 78 mg/dL (ref 65–99)
Potassium: 4.6 mmol/L (ref 3.5–5.1)
Sodium: 139 mmol/L (ref 135–145)

## 2017-05-07 MED ORDER — POTASSIUM CHLORIDE CRYS ER 20 MEQ PO TBCR
40.0000 meq | EXTENDED_RELEASE_TABLET | Freq: Once | ORAL | Status: AC
  Administered 2017-05-07: 40 meq via ORAL
  Filled 2017-05-07: qty 2

## 2017-05-07 MED ORDER — FUROSEMIDE 10 MG/ML IJ SOLN
80.0000 mg | Freq: Once | INTRAMUSCULAR | Status: AC
  Administered 2017-05-07: 80 mg via INTRAVENOUS
  Filled 2017-05-07: qty 8

## 2017-05-07 MED ORDER — HYDROCODONE-ACETAMINOPHEN 5-325 MG PO TABS
1.0000 | ORAL_TABLET | Freq: Once | ORAL | Status: AC
  Administered 2017-05-07: 1 via ORAL
  Filled 2017-05-07: qty 1

## 2017-05-07 NOTE — ED Provider Notes (Signed)
[name] EMERGENCY DEPARTMENT Provider Note  CSN: [id number] Arrival date & time: 05/07/17  1619 History   Chief Complaint Chief Complaint  Patient presents with  . Leg Swelling  . Excessive Sweating   HPI Mark Bullock is a 83 y.o. male.  The history is provided by the patient.  Leg Pain   This is a chronic problem. The current episode started more than 1 week ago. The problem occurs constantly. The problem has been gradually worsening. The pain is present in the left upper leg, left lower leg, right upper leg and right lower leg. The pain is mild. Associated symptoms include limited range of motion. Pertinent negatives include no numbness, no stiffness, no tingling and no itching. The symptoms are aggravated by standing. There has been no history of extremity trauma.  The patient has a long history of chronic lower extremity swelling that has been progressively getting worse, making it painful and very difficult to walk. Family states that he usually walks with a walker but has progressed to the point of having significant difficulty walking.  The patient is supposed to take lasix but is poorly compliant due to increased urinary frequency and trouble making it to the bathroom on time.   Past Medical History:  Diagnosis Date  . Diabetes mellitus without complication (HCC)   . Hypertension   . Stroke [facility](HCC)    There are no active problems to display for this patient.  Past Surgical History:  Procedure Laterality Date  . APPENDECTOMY    . KNEE SURGERY      Home Medications    Prior to Admission medications   Medication Sig Start Date End Date Taking? Authorizing Provider  clopidogrel (PLAVIX) 75 MG tablet Take 75 mg by mouth daily. 01/22/15  Yes [provider]  doxycycline (VIBRAMYCIN) 100 MG capsule Take 1 capsule (100 mg total) by mouth 2 (two) times daily. Patient not taking: Reported on 05/07/2017 03/04/15   Donnetta Hutchingook, Brian, MD  furosemide  (LASIX) 40 MG tablet Take 40 mg by mouth daily. 01/22/15   [provider]  glimepiride (AMARYL) 1 MG tablet Take 1 mg by mouth daily. 01/22/15   [provider]  KLOR-CON M10 10 MEQ tablet Take 10 mEq by mouth daily. 01/22/15   [provider]  lisinopril (PRINIVIL,ZESTRIL) 5 MG tablet Take 5 mg by mouth daily. 01/22/15   [provider]  simvastatin (ZOCOR) 20 MG tablet Take 20 mg by mouth daily. 01/22/15   [provider]  vitamin B-12 (CYANOCOBALAMIN) 100 MCG tablet Take 100 mcg by mouth daily.    [provider]   Family History No family history on file.  Social History Social History  Substance Use Topics  . Smoking status: Never Smoker  . Smokeless tobacco: Never Used  . Alcohol use No   Allergies   Patient has no known allergies.  Review of Systems Review of Systems  Constitutional: Negative for chills and fever.  HENT: Negative for ear pain and sore throat.   Eyes: Negative for pain and visual disturbance.  Respiratory: Negative for cough and shortness of breath.   Cardiovascular: Positive for leg swelling. Negative for chest pain and palpitations.  Gastrointestinal: Negative for abdominal pain and vomiting.  Genitourinary: Negative for dysuria and hematuria.  Musculoskeletal: Positive for gait problem. Negative for arthralgias, back pain and stiffness.  Skin: Negative for color change, itching and rash.  Neurological: Positive for weakness. Negative for tingling, seizures, syncope and numbness.  All  other systems reviewed and are negative.  Physical Exam Updated Vital Signs BP (!) 118/101   Pulse 75   Temp 98.1 F (36.7 C) (Oral)   Resp 13   Ht 5\' 8"  (1.727 m)   Wt 102.1 kg (225 lb)   SpO2 99%   BMI 34.21 kg/m   Physical Exam  Constitutional: He appears well-developed and well-nourished.  HENT:  Head: Normocephalic and atraumatic.  Eyes: Pupils are equal, round, and reactive to light. Conjunctivae and EOM  are normal.  Neck: Normal range of motion. Neck supple.  Cardiovascular: Normal rate and regular rhythm.   No murmur heard. Pulmonary/Chest: Effort normal and breath sounds normal. No respiratory distress.  Abdominal: Soft. He exhibits no distension. There is no tenderness.  Musculoskeletal: He exhibits edema (marked lower extremity swelling bilaterally with chronic skin changes consistent with venous stasis).  Neurological: He is alert.  Skin: Skin is warm and dry.  Psychiatric: He has a normal mood and affect.  Nursing note and vitals reviewed.  ED Treatments / Results  Labs (all labs ordered are listed, but only abnormal results are displayed) Labs Reviewed - No data to display  EKG  EKG Interpretation None       Radiology No results found.  Procedures Procedures (including critical care time)  Medications Ordered in ED Medications - No data to display   Initial Impression / Assessment and Plan / ED Course  I have reviewed the triage vital signs and the nursing notes.  Pertinent labs & imaging results that were available during my care of the patient were reviewed by me and considered in my medical decision making (see chart for details).  Mark Bullock is a 83 y.o. male with PMH HTN who present with increased lower extremity swelling and difficulty ambulating due to pain and swelling.  Basic labs and imaging ordered: unremarkable.   He is hemodynamically stable  Discussed discharge with the family who state that they will not be able to stay with the patient.  Consulted social work for further assistance  At this time the patient does not meet inpatient crieteria or need for admission however is unsafe to go home alone as he has too much difficulty ambulating.   Ordered PT/OT for evaluation in the morning. And discussion with social work about finding the patient nursing home placement.   Final Clinical Impressions(s) / ED Diagnoses   Final diagnoses:    Bilateral lower extremity edema   New Prescriptions New Prescriptions   No medications on file     Lamont Snowball, MD 05/09/17 1610    Raeford Razor, MD 05/22/17 1121

## 2017-05-07 NOTE — ED Notes (Signed)
Dinner tray at bedside, pt declined to go to XR until he was finished eating.

## 2017-05-07 NOTE — ED Notes (Signed)
ED Provider at bedside. 

## 2017-05-07 NOTE — ED Notes (Signed)
Pt and pt family given update to POC.

## 2017-05-07 NOTE — ED Notes (Addendum)
Per Dr. Miquel DunnGarza, pt able to eat. Dinner tray ordered.

## 2017-05-07 NOTE — ED Triage Notes (Signed)
Pt from home via EMS for diaphoresis and leg swelling. Per EMS, pt's roommate called EMS when pt became diaphoretic and when attempting to stand it seemed "like his legs stopped working". Pt with 3+ pitting edema to BLE, pedal pulses 1+ bilaterally. Pt reports pain on walking and cannot straighten his legs fully. Denies CP or SOB. Denies falling. Per EMS, pt diaphoretic on arrival, pt also wearing 6 jackets. Pt A&Ox4 however pt is a poor historian, reports leg swelling for approx 1 year. EMS VS: 140/90, 130 CBG, 97% on RA, 20 G L forearm.

## 2017-05-07 NOTE — ED Notes (Signed)
XR notified pt ready for XR. 

## 2017-05-07 NOTE — ED Notes (Signed)
Pt ambulated with assistance from this RN and [place], EMT. Pt with unsteady gait using walker. Pt reports significant pain and weakness, sway backwards when not assisted.

## 2017-05-07 NOTE — ED Notes (Signed)
Spoke with Dr. Juleen ChinaKohut, will draw CBC and BMP to start pt work up

## 2017-05-07 NOTE — ED Notes (Signed)
Asked PT to walk, PT stated "I can't walk, that is why I am here." RN notified.

## 2017-05-07 NOTE — ED Notes (Signed)
Pt continues to refuse to ambulate. Pt states he cannot walk despite being ambulatory at home with a walker. Will notify EDP.

## 2017-05-07 NOTE — Progress Notes (Signed)
CSW responded to consult and is following up.  Per EDP, pt is unable to walk and possible placement and D/C home with Home Health scenarios.  CSW is contacting the pt now.  Per EDP pt does not meet inpatient criteria and cannot walk.  As such if pt cannot walk pt cannot participate in PT/OT and will be viewed as custodial and will not be rehab-able.  Thus, it is unlikely insurance will authorize SNF placement for this patient.  CSW contacted RN who will place phone in pt's hand since pt is not answering his phone.  CSW spoke to the pt and informed the pt he does not meet inpatient criteria and since pt cannot walk and thus is custodial, the pt will not likely be seen as able to perform for PT and will not likely qualify for SNF and and a such must [facility], Aide and a Child psychotherapistsocial worker.  CSW contacted pt's daughter with the pt's verbal permission of the pt and informed the pt's daughter Blair HaileyJeanette Bishop at ph: [phone] that the pt does not meet inpatient criteria and since pt cannot walk and thus is custodial, the pt will not likely be seen as able to perform for PT and will not likely qualify for SNF and and a such must be [facility], Aide and a Child psychotherapistsocial worker.  CSW called the pt's daughter back again and made her aware by VM that pt will be D/C'ing so she and family can check on the pt and assist him.  CSW updated the EDP that the pt is aware he is D/C'ing and willing to go although he is unhappy about it. EDP acknowledged pt is aware.  CSW updated RN.  CSW will leave handoff.  Please reconsult if future social work needs arise.  CSW signing off, as social work intervention is no longer needed.  Dorothe PeaJonathan F. Ranada Vigorito, LCSW, LCAS, CSI Clinical Social Worker Ph: [phone]

## 2017-05-07 NOTE — ED Notes (Addendum)
EDP at bedside  

## 2017-05-07 NOTE — Discharge Instructions (Addendum)
Continue your current meds.   Home health will contact you regarding physical therapy.   See your doctor. Talk to your doctor about ted hoses.   You may need an echo. Please call cardiology for follow up   Return to ER if you have worse weakness, chest pain, trouble breathing

## 2017-05-07 NOTE — ED Notes (Signed)
Asked PT if he could walk after pain medication was given.  PT said no, he is unable to walk.

## 2017-05-07 NOTE — ED Notes (Signed)
Extra labs drawn. Blue top, dark green top, and second lavender top sent with CBC and BMP to main lab.

## 2017-05-08 ENCOUNTER — Emergency Department (HOSPITAL_COMMUNITY): Payer: Medicare HMO

## 2017-05-08 DIAGNOSIS — R61 Generalized hyperhidrosis: Secondary | ICD-10-CM | POA: Diagnosis not present

## 2017-05-08 DIAGNOSIS — R609 Edema, unspecified: Secondary | ICD-10-CM | POA: Diagnosis not present

## 2017-05-08 DIAGNOSIS — R9431 Abnormal electrocardiogram [ECG] [EKG]: Secondary | ICD-10-CM

## 2017-05-08 DIAGNOSIS — M1990 Unspecified osteoarthritis, unspecified site: Secondary | ICD-10-CM | POA: Diagnosis not present

## 2017-05-08 DIAGNOSIS — R7989 Other specified abnormal findings of blood chemistry: Secondary | ICD-10-CM | POA: Insufficient documentation

## 2017-05-08 DIAGNOSIS — R600 Localized edema: Secondary | ICD-10-CM | POA: Diagnosis not present

## 2017-05-08 DIAGNOSIS — R5381 Other malaise: Secondary | ICD-10-CM | POA: Diagnosis not present

## 2017-05-08 LAB — CBC WITH DIFFERENTIAL/PLATELET
BASOS PCT: 0 %
Basophils Absolute: 0 10*3/uL (ref 0.0–0.1)
EOS ABS: 0.3 10*3/uL (ref 0.0–0.7)
EOS PCT: 5 %
HCT: 39.7 % (ref 39.0–52.0)
HEMOGLOBIN: 12.7 g/dL — AB (ref 13.0–17.0)
Lymphocytes Relative: 24 %
Lymphs Abs: 1.5 10*3/uL (ref 0.7–4.0)
MCH: 28.9 pg (ref 26.0–34.0)
MCHC: 32 g/dL (ref 30.0–36.0)
MCV: 90.4 fL (ref 78.0–100.0)
Monocytes Absolute: 0.8 10*3/uL (ref 0.1–1.0)
Monocytes Relative: 12 %
NEUTROS PCT: 59 %
Neutro Abs: 3.9 10*3/uL (ref 1.7–7.7)
Platelets: 177 10*3/uL (ref 150–400)
RBC: 4.39 MIL/uL (ref 4.22–5.81)
RDW: 14.3 % (ref 11.5–15.5)
WBC: 6.4 10*3/uL (ref 4.0–10.5)

## 2017-05-08 LAB — COMPREHENSIVE METABOLIC PANEL
ALT: 9 U/L — AB (ref 17–63)
AST: 15 U/L (ref 15–41)
Albumin: 3.3 g/dL — ABNORMAL LOW (ref 3.5–5.0)
Alkaline Phosphatase: 50 U/L (ref 38–126)
Anion gap: 8 (ref 5–15)
BILIRUBIN TOTAL: 1.3 mg/dL — AB (ref 0.3–1.2)
BUN: 17 mg/dL (ref 6–20)
CALCIUM: 9 mg/dL (ref 8.9–10.3)
CO2: 29 mmol/L (ref 22–32)
CREATININE: 1.46 mg/dL — AB (ref 0.61–1.24)
Chloride: 102 mmol/L (ref 101–111)
GFR, EST AFRICAN AMERICAN: 48 mL/min — AB (ref >60)
GFR, EST NON AFRICAN AMERICAN: 42 mL/min — AB (ref >60)
Glucose, Bld: 89 mg/dL (ref 65–99)
Potassium: 3.9 mmol/L (ref 3.5–5.1)
Sodium: 139 mmol/L (ref 135–145)
TOTAL PROTEIN: 6.7 g/dL (ref 6.5–8.1)

## 2017-05-08 LAB — URINALYSIS, ROUTINE W REFLEX MICROSCOPIC
BILIRUBIN URINE: NEGATIVE
Glucose, UA: NEGATIVE mg/dL
HGB URINE DIPSTICK: NEGATIVE
Ketones, ur: NEGATIVE mg/dL
Leukocytes, UA: NEGATIVE
Nitrite: NEGATIVE
PH: 5 (ref 5.0–8.0)
Protein, ur: NEGATIVE mg/dL
Specific Gravity, Urine: 1.015 (ref 1.005–1.030)

## 2017-05-08 LAB — I-STAT TROPONIN, ED: Troponin i, poc: 0.01 ng/mL (ref 0.00–0.08)

## 2017-05-08 LAB — BRAIN NATRIURETIC PEPTIDE: B NATRIURETIC PEPTIDE 5: 126.4 pg/mL — AB (ref 0.0–100.0)

## 2017-05-08 MED ORDER — TRAMADOL HCL 50 MG PO TABS: 50.0000 mg | ORAL_TABLET | Freq: Four times a day (QID) | ORAL | 0 refills | Status: DC | PRN

## 2017-05-08 NOTE — ED Notes (Signed)
Patient denies pain and is resting comfortably.  

## 2017-05-08 NOTE — Progress Notes (Signed)
CSW spoke with patients daughter, Mark Bullock [phone], regarding discharge planning. CSW informed daughter that patient was still receive additional tests at this time and was not medically cleared for discharge. EDP has consulted PT to determine if SNF placement is needed. CSW will continue to update family on disposition plans.   Stacy GardnerErin Elhadj Girton, [facility] HillCSWA Emergency Room Clinical Social Worker [phone]

## 2017-05-08 NOTE — ED Notes (Signed)
Pt moved to hallway. Stable at this time. No complaints. Will continue to monitor. Sandwich bag and drink given to patient.

## 2017-05-08 NOTE — ED Notes (Signed)
ED Provider at bedside. 

## 2017-05-08 NOTE — Discharge Planning (Signed)
Audi Conover J. Lucretia RoersWood, RN, BSN, [place] [phone] Although PT Recommending SNF at d/c to increase independence and safety with functional mobility, EDCM advised (CM director) to [facility] as authorization for SNF placement may be lengthy.   Spoke with pt at bedside regarding [facility] planning [facility]. Offered pt list of home health agencies to choose from.  Pt chose [facility] to render services. Samantha Crimesdacia [facility] notified. [facility] that [facility] will be in contact in 24-48 hours.  No DME needs identified at this time.

## 2017-05-08 NOTE — Consult Note (Signed)
Cardiology Consultation:   Patient ID: Mark Bullock; [id number]; 1928-12-05   Admit date: 05/07/2017 Date of Consult: 05/08/2017  Primary Care Provider: Burton Apley, MD Primary Cardiologist: new - Dr. Excell Seltzer Primary Electrophysiologist:     Patient Profile:   Mark Bullock is a 83 y.o. male with a hx of HTN, DM, hx of stroke, and dementia who is being seen today for the evaluation of abnormal EKG at the request of Dr. Silverio Lay.  History of Present Illness:   Mark Bullock was brought from home by his daughter for one episode of diaphoresis, questionable episode was under several blankets. Pt is a poor historian with some underlying dementia, no family at bedside. He adamantly denies chest pain, SOB, palpitations, bleeding, N/V, and syncope.  On exam, he has what looks like chronic lower extremity edema with skin changes that may be related to lymphedema. He is also having pain in his left leg.  BNP mildly elevated. EKG with nonspecific T wave changes.    Past Medical History:  Diagnosis Date  . Diabetes mellitus without complication (HCC)   . Hypertension   . Stroke [facility])     Past Surgical History:  Procedure Laterality Date  . APPENDECTOMY    . KNEE SURGERY       Home Medications:  Prior to Admission medications   Medication Sig Start Date End Date Taking? Authorizing Provider  clopidogrel (PLAVIX) 75 MG tablet Take 75 mg by mouth daily. 01/22/15  Yes [provider]  furosemide (LASIX) 40 MG tablet Take 40 mg by mouth daily. 01/22/15  Yes [provider]  glimepiride (AMARYL) 1 MG tablet Take 1 mg by mouth daily. 01/22/15  Yes [provider]  KLOR-CON M10 10 MEQ tablet Take 10 mEq by mouth daily. 01/22/15  Yes [provider]  lisinopril (PRINIVIL,ZESTRIL) 5 MG tablet Take 5 mg by mouth daily. 01/22/15  Yes [provider]  simvastatin (ZOCOR) 20 MG tablet Take 20 mg by mouth daily. 01/22/15  Yes [provider]  vitamin B-12 (CYANOCOBALAMIN) 100 MCG tablet Take 100 mcg by mouth daily.   Yes [provider]  doxycycline (VIBRAMYCIN) 100 MG capsule Take 1 capsule (100 mg total) by mouth 2 (two) times daily. Patient not taking: Reported on 05/07/2017 03/04/15   Donnetta Hutching, MD    Inpatient Medications: Scheduled Meds:  Continuous Infusions:  PRN Meds:   Allergies:   No Known Allergies  Social History:   Social History   Social History  . Marital status: Divorced    Spouse name: N/A  . Number of children: N/A  . Years of education: N/A   Occupational History  . Not on file.   Social History Main Topics  . Smoking status: Never Smoker  . Smokeless tobacco: Never Used  . Alcohol use No  . Drug use: No  . Sexual activity: Not on file   Other Topics Concern  . Not on file   Social History Narrative  . No narrative on file    Family History:   No family history on file.   ROS:  Please see the history of present illness.  ROS  All other ROS reviewed and negative.     Physical Exam/Data:   Vitals:   05/08/17 0215 05/08/17 0500 05/08/17 1015 05/08/17 1030  BP:  132/74 105/67 119/66  Pulse: 65 82 69 67  Resp: (!) 22 18 (!) 22 20  Temp:      TempSrc:  SpO2: 96% 98% 96% 96%  Weight:      Height:        Intake/Output Summary (Last 24 hours) at 05/08/17 1333 Last data filed at 05/07/17 2253  Gross per 24 hour  Intake                0 ml  Output              750 ml  Net             -750 ml   Filed Weights   05/07/17 1629  Weight: 225 lb (102.1 kg)   Body mass index is 34.21 kg/m.  General:  Well nourished, well developed, in no acute distress HEENT: normal Neck: no JVD Vascular: No carotid bruits  Cardiac:  normal S1, S2; RRR; no murmur  Lungs:  clear to auscultation bilaterally, no wheezing, rhonchi or rales  Abd: soft, nontender, no hepatomegaly  Ext: no edema Musculoskeletal:  No deformities, BUE and BLE strength normal and  equal Skin: warm and dry  Neuro:  CNs 2-12 intact, no focal abnormalities noted Psych:  Normal affect   EKG:  The EKG was personally reviewed and demonstrates:  Sinus, 1st degree heart block, RBBB, LVH Telemetry:  Telemetry was personally reviewed and demonstrates:  sinus  Relevant CV Studies:  Echo ordered  Laboratory Data:  Chemistry Recent Labs Lab 05/07/17 1815 05/08/17 0922  NA 139 139  K 4.6 3.9  CL 103 102  CO2 28 29  GLUCOSE 78 89  BUN 11 17  CREATININE 1.38* 1.46*  CALCIUM 9.3 9.0  GFRNONAA 45* 42*  GFRAA 52* 48*  ANIONGAP 8 8     Recent Labs Lab 05/08/17 0922  PROT 6.7  ALBUMIN 3.3*  AST 15  ALT 9*  ALKPHOS 50  BILITOT 1.3*   Hematology Recent Labs Lab 05/07/17 1815 05/08/17 0922  WBC 8.0 6.4  RBC 4.60 4.39  HGB 13.3 12.7*  HCT 41.1 39.7  MCV 89.3 90.4  MCH 28.9 28.9  MCHC 32.4 32.0  RDW 14.4 14.3  PLT 181 177   Cardiac EnzymesNo results for input(s): TROPONINI in the last 168 hours.  Recent Labs Lab 05/08/17 0933  TROPIPOC 0.01    BNP Recent Labs Lab 05/08/17 0922  BNP 126.4*    DDimer No results for input(s): DDIMER in the last 168 hours.  Radiology/Studies:  Dg Chest 2 View  Result Date: 05/08/2017 CLINICAL DATA:  Diaphoresis EXAM: CHEST  2 VIEW COMPARISON:  July 23, 2014 and January 15, 2010. FINDINGS: There is chronic elevation of the left hemidiaphragm. There is no appreciable edema or consolidation. Heart size and pulmonary vascularity are normal. No adenopathy. There is degenerative change in each shoulder. IMPRESSION: Elevation of right hemidiaphragm.  No edema or consolidation. Electronically Signed   By: Bretta BangWilliam  Woodruff III M.D.   On: 05/08/2017 08:58   Dg Knee Complete 4 Views Left  Result Date: 05/07/2017 CLINICAL DATA:  83 y/o  M; knee pain after fall. EXAM: LEFT KNEE - COMPLETE 4+ VIEW COMPARISON:  None. FINDINGS: Moderate medial femorotibial compartment and mild patellofemoral compartment joint space narrowing.  Small tricompartmental osteophytes and tibial spurring. Joint space calcification likely representing chondrocalcinosis. No acute fracture or joint effusion. Bones are demineralized. IMPRESSION: 1. No acute fracture or joint effusion. 2. 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 Electronically Signed   By: Mitzi HansenLance  Furusawa-Stratton M.D.   On: 05/07/2017 22:29    Assessment and Plan:   1.  Abnormal EKG - EKG with nonspecific T wave changes and RBBB with suspected LVH - reviewed EKG with attending - pt denies anginal symptoms - no further ischemic workup required   2.  Lower extremity swelling and pain - recommend echocardiogram - BNP 126.4 - he is not SOB and is not requiring supplemental oxygen - echocardiogram will help guide medication management - given his pain and chronic skin changes, recommend lower extremity duplex and possibly a vascular consult   3. HTN - home meds include lisinopril   4, Hx of stroke - on plavix   5. HLD - continue zocor    For questions or updates, please contact [facility] Please consult [email or web address] for contact info under Cardiology/STEMI.   Signed, Roe Rutherford Duke, PA  05/08/2017 1:33 PM  Patient seen, examined. Available data reviewed. Agree with findings, assessment, and plan as outlined by Micah Flesher, PA.  On exam the patient is an alert male, elderly, in no distress.  JVP is normal.  There are no carotid bruits.  Lungs are clear bilaterally.  Heart is regular rate and rhythm with no murmur or gallop.  Abdomen is soft nontender.  Extremities show diffuse pretibial edema bilaterally with chronic stasis changes and scaling of the skin.  The patient is independently interviewed and examined.  There is no family at the bedside.  He has absolutely no cardiac complaints and specifically denies chest pain, shortness of breath, or lightheadedness.  Apparently he was brought in because of weakness in his  legs, diaphoresis, and leg swelling.  The changes in his legs appear very chronic on exam.  He has mild BNP elevation and I am not sure of the clinical significance of this.  It seems reasonable to check an echocardiogram.  His EKG demonstrates nonspecific intraventricular conduction delay and ST/T wave abnormality that could be consistent with inferolateral ischemia.  Considering the patient's advanced age, dementia, poor health, and lack of cardiac complaints, I would not pursue any ischemic testing.  Will defer to the primary service about treatment of his chronic leg edema, but I suspect compression and leg elevation would help.  Might consider a venous duplex.  We will follow up on his echo result, but if LV function is normal, he does not require further cardiac evaluation.  Tonny Bollman, M.D. 05/08/2017 2:26 PM

## 2017-05-08 NOTE — ED Provider Notes (Addendum)
  Physical Exam  BP 119/66   Pulse 67   Temp 98.1 F (36.7 C) (Oral)   Resp 20   Ht 5\' 8"  (1.727 m)   Wt 102.1 kg (225 lb)   SpO2 96%   BMI 34.21 kg/m   Physical Exam  ED Course  Procedures  MDM Patient was seen last night for chronic leg swelling but had chest pressure, diaphoresis. Patient apparently was weak and was unable to ambulate even after given lasix.   7 am I examined patient. EKG obtained and showed TWI inferior and lateral that is new since previous. No active chest pain currently. Will get trop, BNP, CXR.   9 am Social work consulted and states that patient may be able to get into a nursing home and will follow up PT consult.   10 am Trop neg x 1. Consulted cardiology given EKG changes regarding further testing.   1:20 PM Cardiology saw patient. States that given EKG changes, will need echo and diuresis in the hospital. Recommend hospitalist admission.   3:44 PM Hospitalist saw patient. Talked to social work, who will arrange for in home rehab and PT.     Charlynne PanderYao, Gualberto Wahlen Hsienta, MD 05/08/17 1420    Charlynne PanderYao, Mahdi Frye Hsienta, MD 05/08/17 [phone]

## 2017-05-08 NOTE — Evaluation (Signed)
Physical Therapy Evaluation Patient Details Name: Mark Bullock MRN: [id number] DOB: Apr 26, 1929 Today's Date: 05/08/2017   History of Present Illness  Pt is an 83 y/o male presenting to the ED secondary to increased LE swelling and weakness. Imaging of the L knee negative for acute abnormality. PMH includes DM, HTN, and CVA.   Clinical Impression  Pt presenting to ED with problem above and deficits below. PTA, pt reports he was living alone and was using 2 canes to ambulate. Upon eval, pt presenting with weakness, L knee pain, decreased balance, and slowed cognitive processing. No family present to determine if pt was at baseline cognitively. Pt required mod to max A for mobility this session and was limited in ambulation distance secondary to L knee pain. Recommending SNF at d/c to increase independence and safety with functional mobility. Will continue to follow acutely to maximize functional mobility independence and safety.     Follow Up Recommendations SNF;Supervision/Assistance - 24 hour    Equipment Recommendations  Wheelchair (measurements PT);Wheelchair cushion (measurements PT)    Recommendations for Other Services       Precautions / Restrictions Precautions Precautions: Fall Restrictions Weight Bearing Restrictions: No      Mobility  Bed Mobility Overal bed mobility: Needs Assistance Bed Mobility: Supine to Sit;Sit to Supine     Supine to sit: Min guard Sit to supine: Min assist   General bed mobility comments: Min A for LE lift assist for return to supine. Required extended time for completion of bed mobility and used bed rails and elevated HOB.   Transfers Overall transfer level: Needs assistance Equipment used: Rolling walker (2 wheeled) Transfers: Sit to/from Stand Sit to Stand: Max assist         General transfer comment: Max A to stand for lift assist and steadying. Extended time required to stand and required verbal cues for powering through  LEs. Demonstrated posterior lean initially and required manual assist for anterior weight shifting.   Ambulation/Gait Ambulation/Gait assistance: Mod assist Ambulation Distance (Feet): 5 Feet Assistive device: Rolling walker (2 wheeled) Gait Pattern/deviations: Step-through pattern;Decreased stride length;Trunk flexed Gait velocity: Decreased Gait velocity interpretation: Below normal speed for age/gender General Gait Details: Slow, very unsteady gait with use of RW. Required mod A for steadying and verbal cues for sequencing using RW. Noted SOB during short ambulation distance and pt reporting L knee pain during ambulation.   Stairs            Wheelchair Mobility    Modified Rankin (Stroke Patients Only)       Balance Overall balance assessment: Needs assistance Sitting-balance support: No upper extremity supported;Feet supported Sitting balance-Leahy Scale: Fair   Postural control: Posterior lean Standing balance support: Bilateral upper extremity supported;During functional activity Standing balance-Leahy Scale: Poor Standing balance comment: Reliant on UE support and external support for balance.                             Pertinent Vitals/Pain Pain Assessment: Faces Faces Pain Scale: Hurts even more Pain Location: L knee  Pain Descriptors / Indicators: Grimacing;Guarding Pain Intervention(s): Limited activity within patient's tolerance;Monitored during session;Repositioned    Home Living Family/patient expects to be discharged to:: Private residence Living Arrangements: Alone Available Help at Discharge: Other (Comment) (Reports no one available to assist ) Type of Home: House Home Access: Stairs to enter Entrance Stairs-Rails: Right;Left Entrance Stairs-Number of Steps: 3 Home Layout: One level Home Equipment: Gilmer Mor -  single point;Walker - 2 wheels      Prior Function Level of Independence: Independent with assistive device(s)          Comments: Reports he used 2 canes for ambulation.      Hand Dominance   Dominant Hand: Right    Extremity/Trunk Assessment   Upper Extremity Assessment Upper Extremity Assessment: Generalized weakness    Lower Extremity Assessment Lower Extremity Assessment: Generalized weakness;LLE deficits/detail;RLE deficits/detail RLE Deficits / Details: Noted RLE swelling LLE Deficits / Details: Noted LLE swelling    Cervical / Trunk Assessment Cervical / Trunk Assessment: Kyphotic  Communication   Communication: No difficulties  Cognition Arousal/Alertness: Awake/alert Behavior During Therapy: WFL for tasks assessed/performed Overall Cognitive Status: No family/caregiver present to determine baseline cognitive functioning                                 General Comments: Pt kept reporting "it's the Lord and I" whenever asked about current living arrangements and reported that when asked if anyone could assist. Pt seemed to talk off topic and noted some slowed cognitive processing.       General Comments General comments (skin integrity, edema, etc.): Educated about need for SNF given current deficits at end of session.     Exercises     Assessment/Plan    PT Assessment Patient needs continued PT services  PT Problem List Cardiopulmonary status limiting activity;Decreased strength;Decreased activity tolerance;Decreased balance;Decreased mobility;Decreased cognition;Decreased knowledge of use of DME;Decreased knowledge of precautions;Decreased safety awareness;Pain       PT Treatment Interventions DME instruction;Gait training;Functional mobility training;Therapeutic activities;Therapeutic exercise;Balance training;Neuromuscular re-education;Cognitive remediation;Patient/family education    PT Goals (Current goals can be found in the Care Plan section)  Acute Rehab PT Goals Patient Stated Goal: to go home  PT Goal Formulation: With patient Time For Goal  Achievement: 05/22/17 Potential to Achieve Goals: Fair    Frequency Min 2X/week   Barriers to discharge Decreased caregiver support Reports no one available to assist at home     Co-evaluation               AM-PAC PT "6 Clicks" Daily Activity  Outcome Measure Difficulty turning over in bed (including adjusting bedclothes, sheets and blankets)?: A Little Difficulty moving from lying on back to sitting on the side of the bed? : Unable Difficulty sitting down on and standing up from a chair with arms (e.g., wheelchair, bedside commode, etc,.)?: Unable Help needed moving to and from a bed to chair (including a wheelchair)?: A Lot Help needed walking in hospital room?: A Lot Help needed climbing 3-5 steps with a railing? : Total 6 Click Score: 10    End of Session Equipment Utilized During Treatment: Gait belt Activity Tolerance: Patient limited by pain Patient left: in bed;with call bell/phone within reach Nurse Communication: Mobility status PT Visit Diagnosis: Unsteadiness on feet (R26.81);Muscle weakness (generalized) (M62.81);Pain Pain - Right/Left: Left Pain - part of body: Knee    Time: 1100-1131 PT Time Calculation (min) (ACUTE ONLY): 31 min   Charges:   PT Evaluation $PT Eval Moderate Complexity: 1 Mod PT Treatments $Gait Training: 8-22 mins   PT G Codes:   PT G-Codes **NOT FOR INPATIENT CLASS** Functional Assessment Tool Used: AM-PAC 6 Clicks Basic Mobility Functional Limitation: Mobility: Walking and moving around Mobility: Walking and Moving Around Current Status (Q6578([id number]): At least 60 percent but less than 80 percent impaired, limited or restricted Mobility:  Walking and Moving Around Goal Status [id number]): At least 40 percent but less than 60 percent impaired, limited or restricted    Gladys Damme, PT, DPT  Acute Rehabilitation Services  Pager: [phone]   Lehman Prom 05/08/2017, 12:02 PM

## 2017-05-08 NOTE — Progress Notes (Signed)
CSW spoke with pt and pt's daughter Para March(Jeanette). Per evening CSW [facility] CSW [facility] with Clinical Director [facility] and was informed that this is the plan for pt at this time. CSW and doctor both spoke with pt and pt is agreeable [facility] Pt will get a Child psychotherapistsocial worker who will be able to work towards placement for pt once home if pt is agreeable. At this time CSW has updated pt's daughter. CSW is signing off.     Claude MangesKierra S. Anaiz Qazi, MSW, LCSW-A Emergency Department Clinical Social Worker [phone]

## 2017-05-08 NOTE — ED Notes (Signed)
Per social work, pt open to the idea of being sent home tonight. Per social work, nobody will be at [facility] tonight, pt's daughter will attempt to take work off tomorrow to check on pt. This RN spoke with Dr. Juleen ChinaKohut and Dr. Miquel DunnGarza. Pt unable to ambulate safely or perform ADLs without high potential for injury. Will speak with charge RN, pt to potentially board in ED for PT eval in AM

## 2017-05-08 NOTE — Care Management (Signed)
ED CM spoke with patient prior to discharge to confirm [facility] services RN, PT, OT, HHA, [facility]. Also discussed [facility] patient is agreeable.  CM will fax in referral as well,  patient verbalized understanding teach back done Reminded patient, that [facility] by [facility] to arrange a visit 24-48 hrs post discharge. Patient verbalized understanding teach back done.  No further ED CM needs identified.

## 2017-05-08 NOTE — Consult Note (Signed)
Medical Consultation   Mark Bullock  ZOX:[id number]  DOB: 1929/06/02  DOA: 05/07/2017  PCP: Burton Apley, MD   Outpatient Specialists: none  Requesting physician: Dr Silverio Lay  Reason for consultation: Treatment plan  History of Present Illness: Mark Bullock is an 83 y.o. male DM, HTN, CVA.   Pt presenting w/ acute onset bilat LE weakness and diaphoresis. Level V caveat applies this patient is a very poor historian. History provided family by patient, patient's daughter, and EMS. Of note patient was at home and performs all of his ADLs.  Patient denies any chest pain, shortness of breath, palpitations, worsening lower extremity edema, abdominal pain, dysuria, frequency, flank pain, neck stiffness, headache, focal neurological deficits. Patient does have pain in his left knee which limits his mobility. Patient also complaining of being diaphoretic at times admission. Upon arrival patient was noted to be wearing 6 jackets.   ED Course: - PT has stayed in a emergency room for approximately 24 hours with extensive workup including cardiology consultation, imaging studies and labs. Essentially unremarkable workup. PT evaluations undertaken which were recommending SNF placement or home health.     Review of Systems:  ROS As per HPI otherwise all other systems reviewed and are negative   Past Medical History: Past Medical History:  Diagnosis Date  . Diabetes mellitus without complication (HCC)   . Hypertension   . Stroke [facility])     Past Surgical History: Past Surgical History:  Procedure Laterality Date  . APPENDECTOMY    . KNEE SURGERY       Allergies:  No Known Allergies   Social History:  reports that he has never smoked. He has never used smokeless tobacco. He reports that he does not drink alcohol or use drugs.   Family History: No family history on file.   Physical Exam: Vitals:   05/08/17 1334 05/08/17 1400 05/08/17 1415 05/08/17  1500  BP: 117/73 114/68 106/63 110/71  Pulse: 75 77 76 75  Resp: 18 (!) 23 (!) 23 19  Temp:      TempSrc:      SpO2: 98% 98% 97% 97%  Weight:      Height:        General: Elderly, resting in bed comfortably. Eyes:  PERRL, EOMI, normal lids, iris ENT:  grossly normal hearing, lips & tongue, mmm Neck:  no LAD, masses or thyromegaly Cardiovascular:  RRR, no m/r/g. 3+ bilateral lower extremity pitting edema Respiratory:  CTA bilaterally, no w/r/r. Normal respiratory effort. Abdomen:  soft, ntnd, NABS Skin: Scaling of the bilateral lower extremities without tenderness, erythema or induration. Musculoskeletal: Limited mobility of the bilateral lower extremities. Normal range of motion of bilateral upper extremity. No bony upper mellitus. Psychiatric: Pleasant. Talkative. All his basic commands. Neurologic:  CN 2-12 grossly intact, moves all extremities in coordinated fashion, sensation intact  Data reviewed:  I have personally reviewed following labs and imaging studies Labs:  CBC:  Recent Labs Lab 05/07/17 1815 05/08/17 0922  WBC 8.0 6.4  NEUTROABS 5.2 3.9  HGB 13.3 12.7*  HCT 41.1 39.7  MCV 89.3 90.4  PLT 181 177    Basic Metabolic Panel:  Recent Labs Lab 05/07/17 1815 05/08/17 0922  NA 139 139  K 4.6 3.9  CL 103 102  CO2 28 29  GLUCOSE 78 89  BUN 11 17  CREATININE 1.38* 1.46*  CALCIUM 9.3 9.0   GFR Estimated Creatinine  Clearance: 42.1 mL/min (A) (by C-G formula based on SCr of 1.46 mg/dL (H)). Liver Function Tests:  Recent Labs Lab 05/08/17 0922  AST 15  ALT 9*  ALKPHOS 50  BILITOT 1.3*  PROT 6.7  ALBUMIN 3.3*   No results for input(s): LIPASE, AMYLASE in the last 168 hours. No results for input(s): AMMONIA in the last 168 hours. Coagulation profile No results for input(s): INR, PROTIME in the last 168 hours.  Cardiac Enzymes: No results for input(s): CKTOTAL, CKMB, CKMBINDEX, TROPONINI in the last 168 hours. BNP: Invalid input(s):  POCBNP CBG: No results for input(s): GLUCAP in the last 168 hours. D-Dimer No results for input(s): DDIMER in the last 72 hours. Hgb A1c No results for input(s): HGBA1C in the last 72 hours. Lipid Profile No results for input(s): CHOL, HDL, LDLCALC, TRIG, CHOLHDL, LDLDIRECT in the last 72 hours. Thyroid function studies No results for input(s): TSH, T4TOTAL, T3FREE, THYROIDAB in the last 72 hours.  Invalid input(s): FREET3 Anemia work up No results for input(s): VITAMINB12, FOLATE, FERRITIN, TIBC, IRON, RETICCTPCT in the last 72 hours. Urinalysis    Component Value Date/Time   COLORURINE YELLOW 05/08/2017 0947   APPEARANCEUR CLEAR 05/08/2017 0947   LABSPEC 1.015 05/08/2017 0947   PHURINE 5.0 05/08/2017 0947   GLUCOSEU NEGATIVE 05/08/2017 0947   HGBUR NEGATIVE 05/08/2017 0947   BILIRUBINUR NEGATIVE 05/08/2017 0947   KETONESUR NEGATIVE 05/08/2017 0947   PROTEINUR NEGATIVE 05/08/2017 0947   UROBILINOGEN 1.0 01/16/2010 0200   NITRITE NEGATIVE 05/08/2017 0947   LEUKOCYTESUR NEGATIVE 05/08/2017 0947     Microbiology No results found for this or any previous visit (from the past 240 hour(s)).     Inpatient Medications:   Scheduled Meds: Continuous Infusions:   Radiological Exams on Admission: Dg Chest 2 View  Result Date: 05/08/2017 CLINICAL DATA:  Diaphoresis EXAM: CHEST  2 VIEW COMPARISON:  July 23, 2014 and January 15, 2010. FINDINGS: There is chronic elevation of the left hemidiaphragm. There is no appreciable edema or consolidation. Heart size and pulmonary vascularity are normal. No adenopathy. There is degenerative change in each shoulder. IMPRESSION: Elevation of right hemidiaphragm.  No edema or consolidation. Electronically Signed   By: Bretta Bang III M.D.   On: 05/08/2017 08:58   Dg Knee Complete 4 Views Left  Result Date: 05/07/2017 CLINICAL DATA:  83 y/o  M; knee pain after fall. EXAM: LEFT KNEE - COMPLETE 4+ VIEW COMPARISON:  None. FINDINGS: Moderate  medial femorotibial compartment and mild patellofemoral compartment joint space narrowing. Small tricompartmental osteophytes and tibial spurring. Joint space calcification likely representing chondrocalcinosis. No acute fracture or joint effusion. Bones are demineralized. IMPRESSION: 1. No acute fracture or joint effusion. 2. 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 Electronically Signed   By: Mitzi Hansen M.D.   On: 05/07/2017 22:29   EKG w/ RBBB, sinus, no ACS.   Impression/Recommendations Active Problems:   Physical deconditioning   Dependent edema   Diaphoresis   Osteoarthritis  Bilat LE weakness and L leg pain: Suspect secondary to age-related deconditioning especially with progressive osteoarthritis predominantly in the left knee. Patient does endorse feeling much improved in the ED after receiving a hydrocodone. Patient had intermittent success working with physical therapy. He was more capable after receiving hydrocodone with recommendations for SNF versus in-home health and PT. Social workers consult did and have graciously assisted in setting up home health for the patient. - Home health per social work with eventual SNF or rehabilitation placement if needed -  Tramadol for pain relief as patient tends to do much better with his rehabilitation in mobility with some mild pain control. Would also recommend Tylenol 1 g every 8 scheduled  Diaphoresis: Likely secondary to the fact the patient was wearing 6 layers at time of admission. No evidence of cardiac involvement given patient's absence of symptoms such as shortness of breath, palpitations and chest pain. Cardiac evaluation undertaken during emergency room stay without any recommendation for further ischemic workup. EKG w/o acute ishcemic changes (likely chronic changes) and troponin nml w/ nml BNP.  - outpatient Echo  LE edema: likely chronic dependent edema from venous insufficiency  and stasis w/ overlying dermatitis changes. No evidence of pain, asymmetry  or acute worsening suggestive of other acute process such as infection, CHF or DVT.  - Compression stockings  F/U PCP in 1-2 weeks.   Thank you for this consultation.  Our [facility] hospitalist team will follow the patient with you.   Time Spent: >45 min  Ozella Rocksavid J Merrell M.D. [facility] 05/08/2017, 4:13 PM

## 2017-05-08 NOTE — ED Notes (Signed)
Gave pt a sprite. 

## 2017-05-08 NOTE — ED Notes (Signed)
Pt on the phone with social work at this time.

## 2017-05-08 NOTE — ED Notes (Signed)
Patient transported to X-ray 

## 2017-05-08 NOTE — ED Notes (Signed)
AVS reviewed with pt. Spoke with pt daughter and informed her he is being transported home at this time by [employer]

## 2017-05-09 ENCOUNTER — Telehealth: Payer: Self-pay | Admitting: *Deleted
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
## 2017-05-17 ENCOUNTER — Emergency Department (HOSPITAL_COMMUNITY): Payer: Medicare HMO

## 2017-05-17 ENCOUNTER — Encounter (HOSPITAL_COMMUNITY): Payer: Self-pay | Admitting: Emergency Medicine

## 2017-05-17 ENCOUNTER — Inpatient Hospital Stay (HOSPITAL_COMMUNITY)
Admission: EM | Admit: 2017-05-17 | Discharge: 2017-05-23 | DRG: 683 | Disposition: A | Discharge status: Skilled Nursing Facility | Payer: Medicare HMO | Attending: Internal Medicine | Admitting: Internal Medicine

## 2017-05-17 DIAGNOSIS — N183 Chronic kidney disease, stage 3 unspecified: Secondary | ICD-10-CM | POA: Diagnosis present

## 2017-05-17 DIAGNOSIS — R778 Other specified abnormalities of plasma proteins: Secondary | ICD-10-CM | POA: Diagnosis present

## 2017-05-17 DIAGNOSIS — Z7984 Long term (current) use of oral hypoglycemic drugs: Secondary | ICD-10-CM

## 2017-05-17 DIAGNOSIS — I69354 Hemiplegia and hemiparesis following cerebral infarction affecting left non-dominant side: Secondary | ICD-10-CM

## 2017-05-17 DIAGNOSIS — I452 Bifascicular block: Secondary | ICD-10-CM | POA: Diagnosis present

## 2017-05-17 DIAGNOSIS — R531 Weakness: Secondary | ICD-10-CM | POA: Diagnosis not present

## 2017-05-17 DIAGNOSIS — E861 Hypovolemia: Secondary | ICD-10-CM | POA: Diagnosis present

## 2017-05-17 DIAGNOSIS — N179 Acute kidney failure, unspecified: Principal | ICD-10-CM | POA: Diagnosis present

## 2017-05-17 DIAGNOSIS — E119 Type 2 diabetes mellitus without complications: Secondary | ICD-10-CM

## 2017-05-17 DIAGNOSIS — R627 Adult failure to thrive: Secondary | ICD-10-CM | POA: Diagnosis present

## 2017-05-17 DIAGNOSIS — Z8673 Personal history of transient ischemic attack (TIA), and cerebral infarction without residual deficits: Secondary | ICD-10-CM

## 2017-05-17 DIAGNOSIS — R05 Cough: Secondary | ICD-10-CM | POA: Diagnosis not present

## 2017-05-17 DIAGNOSIS — E11649 Type 2 diabetes mellitus with hypoglycemia without coma: Secondary | ICD-10-CM | POA: Diagnosis present

## 2017-05-17 DIAGNOSIS — M6282 Rhabdomyolysis: Secondary | ICD-10-CM | POA: Diagnosis present

## 2017-05-17 DIAGNOSIS — R5381 Other malaise: Secondary | ICD-10-CM | POA: Diagnosis present

## 2017-05-17 DIAGNOSIS — Z23 Encounter for immunization: Secondary | ICD-10-CM

## 2017-05-17 DIAGNOSIS — Z7902 Long term (current) use of antithrombotics/antiplatelets: Secondary | ICD-10-CM

## 2017-05-17 DIAGNOSIS — E1122 Type 2 diabetes mellitus with diabetic chronic kidney disease: Secondary | ICD-10-CM | POA: Diagnosis present

## 2017-05-17 DIAGNOSIS — I69322 Dysarthria following cerebral infarction: Secondary | ICD-10-CM

## 2017-05-17 DIAGNOSIS — R7989 Other specified abnormal findings of blood chemistry: Secondary | ICD-10-CM | POA: Diagnosis present

## 2017-05-17 DIAGNOSIS — I5032 Chronic diastolic (congestive) heart failure: Secondary | ICD-10-CM | POA: Diagnosis present

## 2017-05-17 DIAGNOSIS — I130 Hypertensive heart and chronic kidney disease with heart failure and stage 1 through stage 4 chronic kidney disease, or unspecified chronic kidney disease: Secondary | ICD-10-CM | POA: Diagnosis present

## 2017-05-17 DIAGNOSIS — I10 Essential (primary) hypertension: Secondary | ICD-10-CM | POA: Diagnosis present

## 2017-05-17 DIAGNOSIS — E860 Dehydration: Secondary | ICD-10-CM | POA: Diagnosis present

## 2017-05-17 DIAGNOSIS — N1831 Chronic kidney disease, stage 3a: Secondary | ICD-10-CM | POA: Diagnosis present

## 2017-05-17 HISTORY — DX: Acute kidney failure, unspecified: N17.9

## 2017-05-17 LAB — URINALYSIS, ROUTINE W REFLEX MICROSCOPIC
Bilirubin Urine: NEGATIVE
GLUCOSE, UA: NEGATIVE mg/dL
Ketones, ur: 5 mg/dL — AB
LEUKOCYTES UA: NEGATIVE
NITRITE: NEGATIVE
Protein, ur: NEGATIVE mg/dL
SPECIFIC GRAVITY, URINE: 1.017 (ref 1.005–1.030)
pH: 5 (ref 5.0–8.0)

## 2017-05-17 LAB — COMPREHENSIVE METABOLIC PANEL
ALT: 19 U/L (ref 17–63)
AST: 41 U/L (ref 15–41)
Albumin: 3.8 g/dL (ref 3.5–5.0)
Alkaline Phosphatase: 59 U/L (ref 38–126)
Anion gap: 10 (ref 5–15)
BUN: 23 mg/dL — ABNORMAL HIGH (ref 6–20)
CHLORIDE: 103 mmol/L (ref 101–111)
CO2: 24 mmol/L (ref 22–32)
Calcium: 9.3 mg/dL (ref 8.9–10.3)
Creatinine, Ser: 1.82 mg/dL — ABNORMAL HIGH (ref 0.61–1.24)
GFR, EST AFRICAN AMERICAN: 37 mL/min — AB (ref >60)
GFR, EST NON AFRICAN AMERICAN: 32 mL/min — AB (ref >60)
Glucose, Bld: 123 mg/dL — ABNORMAL HIGH (ref 65–99)
POTASSIUM: 3.8 mmol/L (ref 3.5–5.1)
Sodium: 137 mmol/L (ref 135–145)
Total Bilirubin: 2.2 mg/dL — ABNORMAL HIGH (ref 0.3–1.2)
Total Protein: 7.3 g/dL (ref 6.5–8.1)

## 2017-05-17 LAB — CBC WITH DIFFERENTIAL/PLATELET
BASOS ABS: 0 10*3/uL (ref 0.0–0.1)
Basophils Relative: 0 %
EOS ABS: 0 10*3/uL (ref 0.0–0.7)
EOS PCT: 0 %
HCT: 40.7 % (ref 39.0–52.0)
Hemoglobin: 13.6 g/dL (ref 13.0–17.0)
LYMPHS ABS: 0.8 10*3/uL (ref 0.7–4.0)
LYMPHS PCT: 6 %
MCH: 29.6 pg (ref 26.0–34.0)
MCHC: 33.4 g/dL (ref 30.0–36.0)
MCV: 88.5 fL (ref 78.0–100.0)
MONO ABS: 0.9 10*3/uL (ref 0.1–1.0)
Monocytes Relative: 8 %
Neutro Abs: 10.1 10*3/uL — ABNORMAL HIGH (ref 1.7–7.7)
Neutrophils Relative %: 86 %
PLATELETS: 185 10*3/uL (ref 150–400)
RBC: 4.6 MIL/uL (ref 4.22–5.81)
RDW: 13.6 % (ref 11.5–15.5)
WBC: 11.8 10*3/uL — ABNORMAL HIGH (ref 4.0–10.5)

## 2017-05-17 LAB — BRAIN NATRIURETIC PEPTIDE: B NATRIURETIC PEPTIDE 5: 87.6 pg/mL (ref 0.0–100.0)

## 2017-05-17 LAB — MAGNESIUM: MAGNESIUM: 2.1 mg/dL (ref 1.7–2.4)

## 2017-05-17 LAB — TROPONIN I: TROPONIN I: 0.05 ng/mL — AB (ref <0.03)

## 2017-05-17 NOTE — ED Notes (Signed)
Patient transported to X-ray 

## 2017-05-17 NOTE — ED Triage Notes (Signed)
Ems called to pts home by neighbors saw pt on the floor crawling around because he was unable to walk today. Pt has chronic left sided weakness from prior stroke but is usually able to ambulate. Pt was seen yesterday for leg swelling. EMS reports concern for pts ability to care for himself and questionable need for social work consult
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
## 2017-05-18 ENCOUNTER — Other Ambulatory Visit: Payer: Self-pay

## 2017-05-18 ENCOUNTER — Encounter (HOSPITAL_COMMUNITY): Payer: Self-pay | Admitting: Family Medicine

## 2017-05-18 ENCOUNTER — Observation Stay (HOSPITAL_BASED_OUTPATIENT_CLINIC_OR_DEPARTMENT_OTHER): Payer: Medicare HMO

## 2017-05-18 DIAGNOSIS — R531 Weakness: Secondary | ICD-10-CM | POA: Insufficient documentation

## 2017-05-18 DIAGNOSIS — E119 Type 2 diabetes mellitus without complications: Secondary | ICD-10-CM | POA: Diagnosis not present

## 2017-05-18 DIAGNOSIS — N179 Acute kidney failure, unspecified: Secondary | ICD-10-CM

## 2017-05-18 DIAGNOSIS — R5381 Other malaise: Secondary | ICD-10-CM | POA: Diagnosis not present

## 2017-05-18 DIAGNOSIS — N183 Chronic kidney disease, stage 3 unspecified: Secondary | ICD-10-CM | POA: Diagnosis present

## 2017-05-18 DIAGNOSIS — I10 Essential (primary) hypertension: Secondary | ICD-10-CM | POA: Diagnosis present

## 2017-05-18 DIAGNOSIS — R748 Abnormal levels of other serum enzymes: Secondary | ICD-10-CM | POA: Diagnosis not present

## 2017-05-18 DIAGNOSIS — Z8673 Personal history of transient ischemic attack (TIA), and cerebral infarction without residual deficits: Secondary | ICD-10-CM | POA: Diagnosis not present

## 2017-05-18 DIAGNOSIS — R778 Other specified abnormalities of plasma proteins: Secondary | ICD-10-CM | POA: Diagnosis present

## 2017-05-18 DIAGNOSIS — R079 Chest pain, unspecified: Secondary | ICD-10-CM

## 2017-05-18 DIAGNOSIS — N1831 Chronic kidney disease, stage 3a: Secondary | ICD-10-CM | POA: Diagnosis present

## 2017-05-18 DIAGNOSIS — R7989 Other specified abnormal findings of blood chemistry: Secondary | ICD-10-CM | POA: Diagnosis present

## 2017-05-18 HISTORY — DX: Acute kidney failure, unspecified: N17.9

## 2017-05-18 LAB — CBC WITH DIFFERENTIAL/PLATELET
BASOS ABS: 0 10*3/uL (ref 0.0–0.1)
BASOS PCT: 0 %
EOS PCT: 0 %
Eosinophils Absolute: 0 10*3/uL (ref 0.0–0.7)
HCT: 39 % (ref 39.0–52.0)
Hemoglobin: 13 g/dL (ref 13.0–17.0)
Lymphocytes Relative: 9 %
Lymphs Abs: 1 10*3/uL (ref 0.7–4.0)
MCH: 29.5 pg (ref 26.0–34.0)
MCHC: 33.3 g/dL (ref 30.0–36.0)
MCV: 88.6 fL (ref 78.0–100.0)
MONO ABS: 1.1 10*3/uL — AB (ref 0.1–1.0)
Monocytes Relative: 11 %
Neutro Abs: 8.1 10*3/uL — ABNORMAL HIGH (ref 1.7–7.7)
Neutrophils Relative %: 80 %
PLATELETS: 174 10*3/uL (ref 150–400)
RBC: 4.4 MIL/uL (ref 4.22–5.81)
RDW: 13.5 % (ref 11.5–15.5)
WBC: 10.2 10*3/uL (ref 4.0–10.5)

## 2017-05-18 LAB — BASIC METABOLIC PANEL
Anion gap: 9 (ref 5–15)
BUN: 22 mg/dL — ABNORMAL HIGH (ref 6–20)
CALCIUM: 9 mg/dL (ref 8.9–10.3)
CO2: 27 mmol/L (ref 22–32)
CREATININE: 1.63 mg/dL — AB (ref 0.61–1.24)
Chloride: 101 mmol/L (ref 101–111)
GFR calc Af Amer: 42 mL/min — ABNORMAL LOW (ref >60)
GFR calc non Af Amer: 37 mL/min — ABNORMAL LOW (ref >60)
GLUCOSE: 109 mg/dL — AB (ref 65–99)
Potassium: 3.4 mmol/L — ABNORMAL LOW (ref 3.5–5.1)
Sodium: 137 mmol/L (ref 135–145)

## 2017-05-18 LAB — ECHOCARDIOGRAM COMPLETE
Ao-asc: 35 cm
CHL CUP DOP CALC LVOT VTI: 17 cm
E decel time: 313 msec
FS: 26 % — AB (ref 28–44)
Height: 68 in
IVS/LV PW RATIO, ED: 1.03
LA diam end sys: 36 mm
LA vol: 32.6 mL
LADIAMINDEX: 1.81 cm/m2
LASIZE: 36 mm
LAVOLA4C: 30.4 mL
LAVOLIN: 16.4 mL/m2
LDCA: 3.8 cm2
LV SIMPSON'S DISK: 49
LV dias vol index: 47 mL/m2
LV dias vol: 93 mL (ref 62–150)
LVOT SV: 65 mL
LVOT diameter: 22 mm
LVOT peak vel: 103 cm/s
LVSYSVOL: 47 mL (ref 21–61)
LVSYSVOLIN: 24 mL/m2
MV Dec: 313
MV pk A vel: 96 m/s
MVPKEVEL: 67.7 m/s
PW: 12.5 mm — AB (ref 0.6–1.1)
RV LATERAL S' VELOCITY: 11.5 cm/s
RV TAPSE: 20.8 mm
RV sys press: 47 mmHg
Reg peak vel: 281 cm/s
Stroke v: 46 ml
TR max vel: 281 cm/s
Weight: 3017.6 oz

## 2017-05-18 LAB — TROPONIN I
TROPONIN I: 0.15 ng/mL — AB (ref <0.03)
Troponin I: 0.09 ng/mL (ref <0.03)
Troponin I: 0.06 ng/mL (ref <0.03)

## 2017-05-18 LAB — GLUCOSE, CAPILLARY
GLUCOSE-CAPILLARY: 94 mg/dL (ref 65–99)
Glucose-Capillary: 107 mg/dL — ABNORMAL HIGH (ref 65–99)
Glucose-Capillary: 88 mg/dL (ref 65–99)
Glucose-Capillary: 91 mg/dL (ref 65–99)

## 2017-05-18 LAB — CK: CK TOTAL: 2794 U/L — AB (ref 49–397)

## 2017-05-18 LAB — HIV ANTIBODY (ROUTINE TESTING W REFLEX): HIV SCREEN 4TH GENERATION: NONREACTIVE

## 2017-05-18 LAB — HEMOGLOBIN A1C
Hgb A1c MFr Bld: 5.3 % (ref 4.8–5.6)
MEAN PLASMA GLUCOSE: 105.41 mg/dL

## 2017-05-18 LAB — CREATININE, URINE, RANDOM: CREATININE, URINE: 165.94 mg/dL

## 2017-05-18 LAB — TSH: TSH: 1.653 u[IU]/mL (ref 0.350–4.500)

## 2017-05-18 LAB — VITAMIN B12: VITAMIN B 12: 428 pg/mL (ref 180–914)

## 2017-05-18 LAB — SODIUM, URINE, RANDOM: Sodium, Ur: 20 mmol/L

## 2017-05-18 LAB — RPR: RPR: NONREACTIVE

## 2017-05-18 MED ORDER — HYDROCODONE-ACETAMINOPHEN 5-325 MG PO TABS
1.0000 | ORAL_TABLET | ORAL | Status: DC | PRN
  Administered 2017-05-18 – 2017-05-22 (×9): 2 via ORAL
  Filled 2017-05-18: qty 2
  Filled 2017-05-18: qty 1
  Filled 2017-05-18 (×8): qty 2

## 2017-05-18 MED ORDER — ONDANSETRON HCL 4 MG PO TABS: 4.0000 mg | ORAL_TABLET | Freq: Four times a day (QID) | ORAL | Status: DC | PRN

## 2017-05-18 MED ORDER — ACETAMINOPHEN 325 MG PO TABS: 650.0000 mg | ORAL_TABLET | Freq: Four times a day (QID) | ORAL | Status: DC | PRN

## 2017-05-18 MED ORDER — CLOPIDOGREL BISULFATE 75 MG PO TABS
75.0000 mg | ORAL_TABLET | Freq: Every day | ORAL | Status: DC
  Administered 2017-05-18 – 2017-05-23 (×6): 75 mg via ORAL
  Filled 2017-05-18 (×6): qty 1

## 2017-05-18 MED ORDER — ACETAMINOPHEN 650 MG RE SUPP: 650.0000 mg | Freq: Four times a day (QID) | RECTAL | Status: DC | PRN

## 2017-05-18 MED ORDER — SODIUM CHLORIDE 0.9 % IV SOLN
INTRAVENOUS | Status: AC
  Administered 2017-05-18: 02:00:00 via INTRAVENOUS

## 2017-05-18 MED ORDER — INSULIN ASPART 100 UNIT/ML ~~LOC~~ SOLN: 0.0000 [IU] | Freq: Every day | SUBCUTANEOUS | Status: DC

## 2017-05-18 MED ORDER — SENNOSIDES-DOCUSATE SODIUM 8.6-50 MG PO TABS
1.0000 | ORAL_TABLET | Freq: Every evening | ORAL | Status: DC | PRN
  Administered 2017-05-19 – 2017-05-20 (×2): 1 via ORAL
  Filled 2017-05-18 (×2): qty 1

## 2017-05-18 MED ORDER — INSULIN ASPART 100 UNIT/ML ~~LOC~~ SOLN
0.0000 [IU] | Freq: Three times a day (TID) | SUBCUTANEOUS | Status: DC
  Administered 2017-05-21: 1 [IU] via SUBCUTANEOUS

## 2017-05-18 MED ORDER — ASPIRIN 81 MG PO CHEW
324.0000 mg | CHEWABLE_TABLET | Freq: Once | ORAL | Status: AC
  Administered 2017-05-18: 324 mg via ORAL
  Filled 2017-05-18: qty 4

## 2017-05-18 MED ORDER — SIMVASTATIN 20 MG PO TABS
20.0000 mg | ORAL_TABLET | Freq: Every day | ORAL | Status: DC
  Administered 2017-05-18 – 2017-05-22 (×5): 20 mg via ORAL
  Filled 2017-05-18 (×6): qty 1

## 2017-05-18 MED ORDER — VITAMIN B-12 100 MCG PO TABS
100.0000 ug | ORAL_TABLET | Freq: Every day | ORAL | Status: DC
  Administered 2017-05-18 – 2017-05-23 (×6): 100 ug via ORAL
  Filled 2017-05-18 (×6): qty 1

## 2017-05-18 MED ORDER — PNEUMOCOCCAL VAC POLYVALENT 25 MCG/0.5ML IJ INJ
0.5000 mL | INJECTION | INTRAMUSCULAR | Status: AC
  Administered 2017-05-19: 0.5 mL via INTRAMUSCULAR
  Filled 2017-05-18: qty 0.5

## 2017-05-18 MED ORDER — HEPARIN SODIUM (PORCINE) 5000 UNIT/ML IJ SOLN
5000.0000 [IU] | Freq: Three times a day (TID) | INTRAMUSCULAR | Status: DC
  Administered 2017-05-18 – 2017-05-23 (×16): 5000 [IU] via SUBCUTANEOUS
  Filled 2017-05-18 (×16): qty 1

## 2017-05-18 MED ORDER — INFLUENZA VAC SPLIT HIGH-DOSE 0.5 ML IM SUSY
0.5000 mL | PREFILLED_SYRINGE | INTRAMUSCULAR | Status: AC
  Administered 2017-05-19: 0.5 mL via INTRAMUSCULAR
  Filled 2017-05-18: qty 0.5

## 2017-05-18 MED ORDER — ONDANSETRON HCL 4 MG/2ML IJ SOLN: 4.0000 mg | Freq: Four times a day (QID) | INTRAMUSCULAR | Status: DC | PRN

## 2017-05-18 MED ORDER — BISACODYL 5 MG PO TBEC
5.0000 mg | DELAYED_RELEASE_TABLET | Freq: Every day | ORAL | Status: DC | PRN
  Administered 2017-05-20: 5 mg via ORAL
  Filled 2017-05-18: qty 1
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
## 2017-05-18 NOTE — Clinical Social Work Note (Signed)
Clinical Social Work Assessment *Late Entry  Patient Details  Name: Mark Bullock MRN: [id number] Date of Birth: [birth year]  Date of referral:  05/18/17               Reason for consult:  Facility Placement                Permission sought to share information with:    Permission granted to share information::  No  Name::        Agency::     Relationship::     Contact Information:     Housing/Transportation Living arrangements for the past 2 months:  Single Family Home Source of Information:  Patient Patient Interpreter Needed:  None Criminal Activity/Legal Involvement Pertinent to Current Situation/Hospitalization:    Significant Relationships:  Adult Children Lives with:    Do you feel safe going back to the place where you live?  Yes Need for family participation in patient care:  No (Coment)  Care giving concerns: Pt lives alone and was recently D/C'd from the ED and returned home.  He was recently seen by neighbors who called EMD due to the pt crawling across his front lawn", per notes to enter his home.   Social Worker assessment / plan:  CSW spoke with pt and was unable to form a plan with the pt concerning D/C due to the pt's disposition being unknown at this time.  Pt's EDP was waiting on a battery of test results to return at this time.  Pt was agreeable to the disposition saying, "Ya'll do whatever you've got to do".   Pt has been living independently [facility]  Employment status:  Retired Database administratornsurance information:  Managed Medicare PT Recommendations:  Not assessed at this time Information / Referral to community resources:     Patient/Family's Response to care: Patient alert and oriented except for not knowing which hospital he was in.  Pt's daughter were contacted by the CSW but did not return calls.   Per daughter on last visit they are supportive and strongly involved in pt.'s care, but pt prefers not to "bother them", per pt..  Pt pleasant  and appreciated CSW intervention.    Patient/Family's Understanding of and Emotional Response to Diagnosis, Current Treatment, and Prognosis:  Pt and family understand current prognosis and treatment and are awaiting disposition decision by the EDP.Marland Kitchen.  Emotional Assessment Appearance:    Attitude/Demeanor/Rapport:    Affect (typically observed):  Accepting, Adaptable, Calm, Pleasant Orientation:  Oriented to Self, Oriented to  Time, Oriented to Situation Alcohol / Substance use:    Psych involvement (Current and /or in the community):     Discharge Needs  Concerns to be addressed:  (Pt lives alone) Readmission within the last 30 days:  No Current discharge risk:  None Barriers to Discharge:  No Barriers Identified  *Late Entry Mercy RidingJonathan F Teriana Bullock, LCSWA 05/18/2017, 3:35 PM

## 2017-05-18 NOTE — Progress Notes (Signed)
PT Cancellation Note  Patient Details Name: Nadara MustardHenry W Petrea MRN: [id number] DOB: 1928-10-07   Cancelled Treatment:    Reason Eval/Treat Not Completed: Medical issues which prohibited therapy. Troponin trending up from 0.05 to 0.15. Await cardiac clearance or downward trend prior to completion of PT eval.   Ilda FoilGarrow, Caitriona Sundquist Rene 05/18/2017, 8:55 AM
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
## 2017-05-18 NOTE — Progress Notes (Signed)
  Echocardiogram 2D Echocardiogram has been performed.  Mark Bullock, Mark Bullock 05/18/2017, 10:47 AM

## 2017-05-18 NOTE — Progress Notes (Signed)
Pt admitted to 5W from the ED around 0230 am via stretcher. Pt A&Ox3, however has an odd affect. VSS, afebrile upon admission. Skin assessment completed with Boneta LucksJenny, RN-refer to flowsheet for details. PIV to R hand with NS infusing at 100 cc/hr. Pt's valuables collected, witnessed with Boneta LucksJenny and the pt himself and sent down to security-copy of valuables sheet is in pt's chart. Pt oriented to the room/rules/call light. Call light within reach, bed alarm on for safety. Will ctm.

## 2017-05-18 NOTE — Progress Notes (Signed)
OT Cancellation Note  Patient Details Name: Mark Bullock MRN: [id number] DOB: 1929-02-15   Cancelled Treatment:    Reason Eval/Treat Not Completed: Medical issues which prohibited therapy(Pt with uptrending troponin. Awaiting medical clearance or downward trend.  Evern BioMayberry, Tracen Mahler Lynn 05/18/2017, 10:47 AM  05/18/2017 Martie RoundJulie Preslynn Bier, OTR/L Pager: [phone]

## 2017-05-18 NOTE — Progress Notes (Signed)
Paged MD regarding increase in pt's trop from 0.05 to 0.15. Awaiting response. Will ctm.  *[phone] am-no call back/new orders obtained, however echo was ordered early this am by Dr. Isidoro Donningai.

## 2017-05-18 NOTE — Care Management Obs Status (Signed)
MEDICARE OBSERVATION STATUS NOTIFICATION   Patient Details  Name: Mark Bullock MRN: [id number] Date of Birth: 05-Mar-1929   Medicare Observation Status Notification Given:  Yes    Epifanio LeschesCole, Bisma Klett Hudson, RN 05/18/2017, 5:20 PM

## 2017-05-18 NOTE — Evaluation (Signed)
Physical Therapy Evaluation Patient Details Name: Nadara MustardHenry W Banke MRN: [id number] DOB: 12-Oct-1928 Today's Date: 05/18/2017   History of Present Illness  83 y.o. male with medical history significant for history of CVA with persistent left-sided weakness, hypertension, chronic kidney disease stage III, and type 2 diabetes mellitus, now presenting to the emergency department with generalized weakness.  Patient has reportedly been suffering from progressive generalized weakness and deconditioning and recently had home health established, but unfortunately they found the patient to be a hoarder and was unable to enter his home secondary to this.  Patient was seen by his neighbors today crawling, and they called EMS out for evaluation.  He reports that he has been unable to walk due to generalized weakness.   Clinical Impression  Pt admitted with above diagnosis. Pt currently with functional limitations due to the deficits listed below (see PT Problem List). On eval, pt required min assist bed mobility and max assist sit to stand with RW. Unable to progress to bed to chair transfers or ambulation due to weakness and bilat knee pain.  Pt will benefit from skilled PT to increase their independence and safety with mobility to allow discharge to the venue listed below.       Follow Up Recommendations SNF;Supervision/Assistance - 24 hour    Equipment Recommendations  Wheelchair (measurements PT);Wheelchair cushion (measurements PT)    Recommendations for Other Services       Precautions / Restrictions Precautions Precautions: Fall Restrictions Weight Bearing Restrictions: No      Mobility  Bed Mobility Overal bed mobility: Needs Assistance Bed Mobility: Supine to Sit;Sit to Supine     Supine to sit: Min guard;HOB elevated Sit to supine: Min assist;HOB elevated   General bed mobility comments: +rail, min assist BLE for return to bed. Increased time and effort.  Transfers Overall  transfer level: Needs assistance Equipment used: Rolling walker (2 wheeled) Transfers: Sit to/from Stand Sit to Stand: Max assist         General transfer comment: assist to power up and stabilize initial standing balance. Pt able to stand statically 15 seconds with min assist. Unable to take pivot steps for transfer.  Ambulation/Gait             General Gait Details: unable  Stairs            Wheelchair Mobility    Modified Rankin (Stroke Patients Only)       Balance   Sitting-balance support: No upper extremity supported;Feet supported Sitting balance-Leahy Scale: Good     Standing balance support: Bilateral upper extremity supported;During functional activity Standing balance-Leahy Scale: Poor Standing balance comment: heavy reliance on RW and external support                             Pertinent Vitals/Pain Pain Assessment: Faces Faces Pain Scale: Hurts even more Pain Location: bilat knees Pain Descriptors / Indicators: Grimacing;Guarding Pain Intervention(s): Limited activity within patient's tolerance;Monitored during session    Home Living Family/patient expects to be discharged to:: Private residence Living Arrangements: Alone Available Help at Discharge: Neighbor;Available PRN/intermittently Type of Home: House Home Access: Stairs to enter Entrance Stairs-Rails: Doctor, general practiceight;Left Entrance Stairs-Number of Steps: 3 Home Layout: One level Home Equipment: Cane - single point;Walker - 2 wheels      Prior Function Level of Independence: Independent with assistive device(s)         Comments: Reports he used 2 canes for ambulation.  Hand Dominance   Dominant Hand: Right    Extremity/Trunk Assessment   Upper Extremity Assessment Upper Extremity Assessment: Overall WFL for tasks assessed    Lower Extremity Assessment Lower Extremity Assessment: Generalized weakness RLE Deficits / Details: Noted RLE swelling, limited knee  ROM due to pain LLE Deficits / Details: Noted LLE swelling, limited knee ROM due to pain    Cervical / Trunk Assessment Cervical / Trunk Assessment: Kyphotic  Communication   Communication: No difficulties  Cognition Arousal/Alertness: Awake/alert Behavior During Therapy: WFL for tasks assessed/performed Overall Cognitive Status: No family/caregiver present to determine baseline cognitive functioning                                 General Comments: A&O x 4. Very intense. Tangential speech. Slowed cognitive processing.      General Comments      Exercises     Assessment/Plan    PT Assessment Patient needs continued PT services  PT Problem List Cardiopulmonary status limiting activity;Decreased strength;Decreased activity tolerance;Decreased balance;Decreased mobility;Decreased cognition;Decreased knowledge of use of DME;Decreased knowledge of precautions;Decreased safety awareness;Pain;Decreased skin integrity       PT Treatment Interventions DME instruction;Gait training;Functional mobility training;Therapeutic activities;Therapeutic exercise;Balance training;Neuromuscular re-education;Cognitive remediation;Patient/family education    PT Goals (Current goals can be found in the Care Plan section)  Acute Rehab PT Goals Patient Stated Goal: to go home  PT Goal Formulation: With patient Time For Goal Achievement: 06/01/17 Potential to Achieve Goals: Fair    Frequency Min 2X/week   Barriers to discharge Decreased caregiver support      Co-evaluation               AM-PAC PT "6 Clicks" Daily Activity  Outcome Measure Difficulty turning over in bed (including adjusting bedclothes, sheets and blankets)?: A Little Difficulty moving from lying on back to sitting on the side of the bed? : A Lot Difficulty sitting down on and standing up from a chair with arms (e.g., wheelchair, bedside commode, etc,.)?: Unable Help needed moving to and from a bed to chair  (including a wheelchair)?: Total Help needed walking in hospital room?: Total Help needed climbing 3-5 steps with a railing? : Total 6 Click Score: 9    End of Session Equipment Utilized During Treatment: Gait belt Activity Tolerance: Patient limited by pain Patient left: in bed;with call bell/phone within reach Nurse Communication: Mobility status PT Visit Diagnosis: Other abnormalities of gait and mobility (R26.89);Muscle weakness (generalized) (M62.81);Pain    Time: [id number] PT Time Calculation (min) (ACUTE ONLY): 31 min   Charges:   PT Evaluation $PT Eval Moderate Complexity: 1 Mod PT Treatments $Therapeutic Activity: 8-22 mins   PT G Codes:   PT G-Codes **NOT FOR INPATIENT CLASS** Functional Assessment Tool Used: AM-PAC 6 Clicks Basic Mobility Functional Limitation: Mobility: Walking and moving around Mobility: Walking and Moving Around Current Status ([id number](G8978): At least 60 percent but less than 80 percent impaired, limited or restricted Mobility: Walking and Moving Around Goal Status [phone](G8979): At least 40 percent but less than 60 percent impaired, limited or restricted    Aida RaiderWendy Jhanvi Drakeford, PT  Office # [phone] Pager [phone]   Ilda FoilGarrow, Dianna Deshler Rene 05/18/2017, 2:06 PM

## 2017-05-19 DIAGNOSIS — Z7902 Long term (current) use of antithrombotics/antiplatelets: Secondary | ICD-10-CM | POA: Diagnosis not present

## 2017-05-19 DIAGNOSIS — R748 Abnormal levels of other serum enzymes: Secondary | ICD-10-CM | POA: Diagnosis not present

## 2017-05-19 DIAGNOSIS — I69354 Hemiplegia and hemiparesis following cerebral infarction affecting left non-dominant side: Secondary | ICD-10-CM | POA: Diagnosis not present

## 2017-05-19 DIAGNOSIS — E11649 Type 2 diabetes mellitus with hypoglycemia without coma: Secondary | ICD-10-CM | POA: Diagnosis present

## 2017-05-19 DIAGNOSIS — I69322 Dysarthria following cerebral infarction: Secondary | ICD-10-CM | POA: Diagnosis not present

## 2017-05-19 DIAGNOSIS — M6282 Rhabdomyolysis: Secondary | ICD-10-CM | POA: Diagnosis present

## 2017-05-19 DIAGNOSIS — I452 Bifascicular block: Secondary | ICD-10-CM | POA: Diagnosis present

## 2017-05-19 DIAGNOSIS — E860 Dehydration: Secondary | ICD-10-CM | POA: Diagnosis present

## 2017-05-19 DIAGNOSIS — I5032 Chronic diastolic (congestive) heart failure: Secondary | ICD-10-CM | POA: Diagnosis present

## 2017-05-19 DIAGNOSIS — R05 Cough: Secondary | ICD-10-CM | POA: Diagnosis not present

## 2017-05-19 DIAGNOSIS — R531 Weakness: Secondary | ICD-10-CM | POA: Diagnosis present

## 2017-05-19 DIAGNOSIS — R627 Adult failure to thrive: Secondary | ICD-10-CM | POA: Diagnosis present

## 2017-05-19 DIAGNOSIS — Z23 Encounter for immunization: Secondary | ICD-10-CM | POA: Diagnosis present

## 2017-05-19 DIAGNOSIS — E119 Type 2 diabetes mellitus without complications: Secondary | ICD-10-CM | POA: Diagnosis not present

## 2017-05-19 DIAGNOSIS — N179 Acute kidney failure, unspecified: Secondary | ICD-10-CM | POA: Diagnosis present

## 2017-05-19 DIAGNOSIS — Z8673 Personal history of transient ischemic attack (TIA), and cerebral infarction without residual deficits: Secondary | ICD-10-CM | POA: Diagnosis not present

## 2017-05-19 DIAGNOSIS — Z7984 Long term (current) use of oral hypoglycemic drugs: Secondary | ICD-10-CM | POA: Diagnosis not present

## 2017-05-19 DIAGNOSIS — I10 Essential (primary) hypertension: Secondary | ICD-10-CM | POA: Diagnosis not present

## 2017-05-19 DIAGNOSIS — E861 Hypovolemia: Secondary | ICD-10-CM | POA: Diagnosis present

## 2017-05-19 DIAGNOSIS — R5381 Other malaise: Secondary | ICD-10-CM | POA: Diagnosis not present

## 2017-05-19 DIAGNOSIS — I130 Hypertensive heart and chronic kidney disease with heart failure and stage 1 through stage 4 chronic kidney disease, or unspecified chronic kidney disease: Secondary | ICD-10-CM | POA: Diagnosis present

## 2017-05-19 DIAGNOSIS — E1122 Type 2 diabetes mellitus with diabetic chronic kidney disease: Secondary | ICD-10-CM | POA: Diagnosis present

## 2017-05-19 DIAGNOSIS — N183 Chronic kidney disease, stage 3 (moderate): Secondary | ICD-10-CM | POA: Diagnosis present

## 2017-05-19 LAB — CBC
HCT: 35 % — ABNORMAL LOW (ref 39.0–52.0)
Hemoglobin: 11.8 g/dL — ABNORMAL LOW (ref 13.0–17.0)
MCH: 30.6 pg (ref 26.0–34.0)
MCHC: 33.7 g/dL (ref 30.0–36.0)
MCV: 90.7 fL (ref 78.0–100.0)
PLATELETS: 155 10*3/uL (ref 150–400)
RBC: 3.86 MIL/uL — AB (ref 4.22–5.81)
RDW: 14.1 % (ref 11.5–15.5)
WBC: 7.6 10*3/uL (ref 4.0–10.5)

## 2017-05-19 LAB — BASIC METABOLIC PANEL
Anion gap: 4 — ABNORMAL LOW (ref 5–15)
BUN: 15 mg/dL (ref 6–20)
CALCIUM: 8.8 mg/dL — AB (ref 8.9–10.3)
CHLORIDE: 103 mmol/L (ref 101–111)
CO2: 31 mmol/L (ref 22–32)
CREATININE: 1.34 mg/dL — AB (ref 0.61–1.24)
GFR calc Af Amer: 54 mL/min — ABNORMAL LOW (ref >60)
GFR calc non Af Amer: 46 mL/min — ABNORMAL LOW (ref >60)
GLUCOSE: 68 mg/dL (ref 65–99)
Potassium: 4.1 mmol/L (ref 3.5–5.1)
Sodium: 138 mmol/L (ref 135–145)

## 2017-05-19 LAB — GLUCOSE, CAPILLARY
GLUCOSE-CAPILLARY: 102 mg/dL — AB (ref 65–99)
Glucose-Capillary: 75 mg/dL (ref 65–99)
Glucose-Capillary: 95 mg/dL (ref 65–99)
Glucose-Capillary: 123 mg/dL — ABNORMAL HIGH (ref 65–99)

## 2017-05-19 LAB — UREA NITROGEN, URINE: UREA NITROGEN UR: 1153 mg/dL

## 2017-05-19 LAB — CK: CK TOTAL: 2229 U/L — AB (ref 49–397)

## 2017-05-19 MED ORDER — GUAIFENESIN ER 600 MG PO TB12
600.0000 mg | ORAL_TABLET | Freq: Two times a day (BID) | ORAL | Status: DC
  Administered 2017-05-19 – 2017-05-23 (×9): 600 mg via ORAL
  Filled 2017-05-19 (×9): qty 1

## 2017-05-19 MED ORDER — SODIUM CHLORIDE 0.9 % IV SOLN
INTRAVENOUS | Status: DC
  Administered 2017-05-19 – 2017-05-23 (×7): via INTRAVENOUS
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
## 2017-05-19 NOTE — Evaluation (Signed)
Occupational Therapy Evaluation Patient Details Name: Mark Bullock MRN: [id number] DOB: January 09, 1929 Today's Date: 05/19/2017    History of Present Illness 83 y.o. male with medical history significant for history of CVA with persistent left-sided weakness, hypertension, chronic kidney disease stage III, and type 2 diabetes mellitus, now presenting to the emergency department with generalized weakness.  Patient has reportedly been suffering from progressive generalized weakness and deconditioning and recently had home health established, but unfortunately they found the patient to be a hoarder and was unable to enter his home secondary to this.  Patient was seen by his neighbors today crawling, and they called EMS out for evaluation.  He reports that he has been unable to walk due to generalized weakness.    Clinical Impression   Pt admitted with the above diagnoses and presents with below problem list. Pt will benefit from continued acute OT to address the below listed deficits and maximize independence with basic ADLs prior to d/c to next venue. Unclear level of assist with ADLs PTA as pt was living alone and has cognitive impairments. Pt is currently max A +2 for safety with LB ADLs, max A with UB ADLs in supported sitting position. This session pt completed bed mobility and 2x sit<>stand transfers with sidesteps incorporated.      Follow Up Recommendations  SNF    Equipment Recommendations  Other (comment)(defer to next venue)    Recommendations for Other Services       Precautions / Restrictions Precautions Precautions: Fall Restrictions Weight Bearing Restrictions: No      Mobility Bed Mobility Overal bed mobility: Needs Assistance Bed Mobility: Supine to Sit;Sit to Supine     Supine to sit: Min guard;HOB elevated Sit to supine: Min assist;HOB elevated   General bed mobility comments: +rail, min assist BLE for return to bed. Increased time and  effort.  Transfers Overall transfer level: Needs assistance Equipment used: Rolling walker (2 wheeled) Transfers: Sit to/from Stand Sit to Stand: Max assist         General transfer comment: assist to powerup and steady. extra time and effort. Stood 2 times. First trial stood about 20 seconds. Second trial stood for about a minute with sidesteps incorporated. Fatigues quickly. Cues for sequencing.     Balance Overall balance assessment: Needs assistance Sitting-balance support: No upper extremity supported;Feet supported Sitting balance-Leahy Scale: Good   Postural control: Posterior lean Standing balance support: Bilateral upper extremity supported;During functional activity Standing balance-Leahy Scale: Poor Standing balance comment: heavy reliance on RW and external support                           ADL either performed or assessed with clinical judgement   ADL Overall ADL's : Needs assistance/impaired Eating/Feeding: Set up;Sitting;Bed level Eating/Feeding Details (indicate cue type and reason): supported sitting Grooming: Set up;Sitting;Bed level;Minimal assistance Grooming Details (indicate cue type and reason): supported sitting Upper Body Bathing: Maximal assistance;Sitting;Bed level Upper Body Bathing Details (indicate cue type and reason): supported sitting Lower Body Bathing: Maximal assistance;+2 for safety/equipment;Sit to/from stand Lower Body Bathing Details (indicate cue type and reason): 1 person for balance + 1 person for assist with ADL task Upper Body Dressing : Minimal assistance Upper Body Dressing Details (indicate cue type and reason): supported sitting Lower Body Dressing: Maximal assistance;+2 for safety/equipment;Sit to/from stand Lower Body Dressing Details (indicate cue type and reason): 1 person for balance + 1 person for assist with ADL task  General ADL Comments: Pt completed bed mobility and 2x sit<>stands from EOB  with sidesteps incorporated. Very unsteady, needing max A for standing balance. "I've got to do this so I can go home!"     Vision         Perception     Praxis      Pertinent Vitals/Pain Pain Assessment: No/denies pain     Hand Dominance Right   Extremity/Trunk Assessment Upper Extremity Assessment Upper Extremity Assessment: Generalized weakness   Lower Extremity Assessment Lower Extremity Assessment: Defer to PT evaluation   Cervical / Trunk Assessment Cervical / Trunk Assessment: Kyphotic   Communication Communication Communication: No difficulties   Cognition Arousal/Alertness: Awake/alert Behavior During Therapy: Anxious;WFL for tasks assessed/performed Overall Cognitive Status: No family/caregiver present to determine baseline cognitive functioning                                 General Comments: Very intense. Verbose and tangential speech. Slow processing. Appears to have difficulty following nuances of conversation.   General Comments       Exercises     Shoulder Instructions      Home Living Family/patient expects to be discharged to:: Private residence Living Arrangements: Alone Available Help at Discharge: Neighbor;Available PRN/intermittently Type of Home: House Home Access: Stairs to enter Entergy CorporationEntrance Stairs-Number of Steps: 3 Entrance Stairs-Rails: Right;Left Home Layout: One level     Bathroom Shower/Tub: Chief Strategy OfficerTub/shower unit   Bathroom Toilet: Standard     Home Equipment: Cane - single point;Walker - 2 wheels          Prior Functioning/Environment Level of Independence: Independent with assistive device(s)        Comments: Reports he used 2 canes for ambulation.         OT Problem List: Decreased strength;Decreased activity tolerance;Impaired balance (sitting and/or standing);Decreased cognition;Decreased safety awareness;Decreased knowledge of use of DME or AE;Decreased knowledge of precautions;Obesity;Pain      OT  Treatment/Interventions: Self-care/ADL training;Therapeutic exercise;DME and/or AE instruction;Therapeutic activities;Patient/family education;Balance training    OT Goals(Current goals can be found in the care plan section) Acute Rehab OT Goals Patient Stated Goal: to go home  OT Goal Formulation: With patient Time For Goal Achievement: 06/02/17 Potential to Achieve Goals: Good ADL Goals Pt Will Perform Grooming: with min assist;sitting Pt Will Perform Upper Body Bathing: with min assist;sitting Pt Will Perform Lower Body Bathing: with mod assist;sit to/from stand;sitting/lateral leans Pt Will Perform Upper Body Dressing: with min guard assist;sitting Pt Will Perform Lower Body Dressing: with mod assist;sit to/from stand;sitting/lateral leans Pt Will Transfer to Toilet: stand pivot transfer;with max assist;bedside commode Pt Will Perform Toileting - Clothing Manipulation and hygiene: with mod assist;sit to/from stand;sitting/lateral leans Additional ADL Goal #1: Pt will complete bed mobility at supervision level as precursor to ADLs.  OT Frequency: Min 2X/week   Barriers to D/C: Inaccessible home environment;Decreased caregiver support          Co-evaluation              AM-PAC PT "6 Clicks" Daily Activity     Outcome Measure Help from another person eating meals?: A Little Help from another person taking care of personal grooming?: A Lot Help from another person toileting, which includes using toliet, bedpan, or urinal?: Total Help from another person bathing (including washing, rinsing, drying)?: Total Help from another person to put on and taking off regular upper body clothing?: A Lot Help from  another person to put on and taking off regular lower body clothing?: Total 6 Click Score: 10   End of Session Equipment Utilized During Treatment: Gait belt;Rolling walker  Activity Tolerance: Patient limited by fatigue;Patient tolerated treatment well Patient left: in bed;with  call bell/phone within reach;with bed alarm set  OT Visit Diagnosis: Unsteadiness on feet (R26.81);Other abnormalities of gait and mobility (R26.89);Pain;Muscle weakness (generalized) (M62.81)                Time: [id number] OT Time Calculation (min): 30 min Charges:  OT General Charges $OT Visit: 1 Visit OT Evaluation $OT Eval Moderate Complexity: 1 Mod OT Treatments $Self Care/Home Management : 8-22 mins G-Codes:       Pilar GrammesMathews, Traniyah Hallett H 05/19/2017, 1:00 PM

## 2017-05-20 LAB — BASIC METABOLIC PANEL
ANION GAP: 6 (ref 5–15)
BUN: 13 mg/dL (ref 6–20)
CALCIUM: 8.4 mg/dL — AB (ref 8.9–10.3)
CO2: 28 mmol/L (ref 22–32)
Chloride: 103 mmol/L (ref 101–111)
Creatinine, Ser: 1.26 mg/dL — ABNORMAL HIGH (ref 0.61–1.24)
GFR, EST AFRICAN AMERICAN: 58 mL/min — AB (ref >60)
GFR, EST NON AFRICAN AMERICAN: 50 mL/min — AB (ref >60)
GLUCOSE: 77 mg/dL (ref 65–99)
POTASSIUM: 3.6 mmol/L (ref 3.5–5.1)
SODIUM: 137 mmol/L (ref 135–145)

## 2017-05-20 LAB — CBC
HEMATOCRIT: 36.6 % — AB (ref 39.0–52.0)
HEMOGLOBIN: 11.6 g/dL — AB (ref 13.0–17.0)
MCH: 28.7 pg (ref 26.0–34.0)
MCHC: 31.7 g/dL (ref 30.0–36.0)
MCV: 90.6 fL (ref 78.0–100.0)
Platelets: 163 10*3/uL (ref 150–400)
RBC: 4.04 MIL/uL — AB (ref 4.22–5.81)
RDW: 13.8 % (ref 11.5–15.5)
WBC: 8.4 10*3/uL (ref 4.0–10.5)

## 2017-05-20 LAB — GLUCOSE, CAPILLARY
GLUCOSE-CAPILLARY: 73 mg/dL (ref 65–99)
GLUCOSE-CAPILLARY: 110 mg/dL — AB (ref 65–99)
GLUCOSE-CAPILLARY: 103 mg/dL — AB (ref 65–99)
Glucose-Capillary: 157 mg/dL — ABNORMAL HIGH (ref 65–99)
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
## 2017-05-21 LAB — FOLATE RBC
FOLATE, HEMOLYSATE: 316.8 ng/mL
Folate, RBC: 792 ng/mL (ref >498)
Hematocrit: 40 % (ref 37.5–51.0)

## 2017-05-21 LAB — CBC
HEMATOCRIT: 37.9 % — AB (ref 39.0–52.0)
Hemoglobin: 12.3 g/dL — ABNORMAL LOW (ref 13.0–17.0)
MCH: 29.5 pg (ref 26.0–34.0)
MCHC: 32.5 g/dL (ref 30.0–36.0)
MCV: 90.9 fL (ref 78.0–100.0)
Platelets: 168 10*3/uL (ref 150–400)
RBC: 4.17 MIL/uL — AB (ref 4.22–5.81)
RDW: 13.7 % (ref 11.5–15.5)
WBC: 7.2 10*3/uL (ref 4.0–10.5)

## 2017-05-21 LAB — BASIC METABOLIC PANEL
ANION GAP: 4 — AB (ref 5–15)
BUN: 10 mg/dL (ref 6–20)
CALCIUM: 8.5 mg/dL — AB (ref 8.9–10.3)
CO2: 31 mmol/L (ref 22–32)
Chloride: 103 mmol/L (ref 101–111)
Creatinine, Ser: 1.14 mg/dL (ref 0.61–1.24)
GFR, EST NON AFRICAN AMERICAN: 56 mL/min — AB (ref >60)
GLUCOSE: 80 mg/dL (ref 65–99)
POTASSIUM: 3.9 mmol/L (ref 3.5–5.1)
Sodium: 138 mmol/L (ref 135–145)

## 2017-05-21 LAB — GLUCOSE, CAPILLARY
GLUCOSE-CAPILLARY: 81 mg/dL (ref 65–99)
GLUCOSE-CAPILLARY: 125 mg/dL — AB (ref 65–99)
GLUCOSE-CAPILLARY: 76 mg/dL (ref 65–99)
GLUCOSE-CAPILLARY: 99 mg/dL (ref 65–99)
Glucose-Capillary: 65 mg/dL (ref 65–99)

## 2017-05-21 MED ORDER — AQUAPHOR EX OINT
TOPICAL_OINTMENT | Freq: Two times a day (BID) | CUTANEOUS | Status: DC | PRN
  Administered 2017-05-21: 06:00:00 via TOPICAL
  Filled 2017-05-21: qty 50

## 2017-05-21 MED ORDER — GUAIFENESIN ER 600 MG PO TB12: 600.0000 mg | ORAL_TABLET | Freq: Two times a day (BID) | ORAL | Status: DC

## 2017-05-21 NOTE — [place] FL2 (Signed)
[place] MEDICAID FL2 LEVEL OF CARE SCREENING TOOL     IDENTIFICATION  Patient Name: Mark Bullock Birthdate: January 13, 1929 Sex: male Admission Date (Current Location): 05/17/2017  [facility] [place] [place] [place]   Facility and Address:  [facility] [address], [place], [place] [address]      Provider Number: [id number]  Attending Physician Name and Address:  Cathren Harshai, Ta Fair K, MD  Relative Name and Phone Number:       Current Level of Care: Hospital Recommended Level of Care: Skilled Nursing Facility Prior Approval Number:    Date Approved/Denied:   PASRR Number: [id number]  Discharge Plan: SNF    Current Diagnoses: Patient Active Problem List   Diagnosis Date Noted  . Rhabdomyolysis 05/19/2017  . Diabetes mellitus without complication (HCC) 05/18/2017  . AKI (acute kidney injury) (HCC) 05/18/2017  . Elevated troponin 05/18/2017  . Hypertension 05/18/2017  . History of completed stroke 05/18/2017  . CKD (chronic kidney disease), stage III (HCC) 05/18/2017  . Weakness   . Physical deconditioning 05/08/2017  . Dependent edema 05/08/2017  . Diaphoresis 05/08/2017  . Osteoarthritis 05/08/2017  . Bilateral lower extremity edema   . Abnormal EKG   . Elevated brain natriuretic peptide (BNP) level     Orientation RESPIRATION BLADDER Height & Weight     Self, Place  Normal Incontinent Weight: 98.2 kg (216 lb 6.4 oz) Height:  5\' 8"  (172.7 cm)  BEHAVIORAL SYMPTOMS/MOOD NEUROLOGICAL BOWEL NUTRITION STATUS  (N/A)   Continent Diet(Heart Room)  AMBULATORY STATUS COMMUNICATION OF NEEDS Skin   Extensive Assist Verbally Normal                       Personal Care Assistance Level of Assistance  Bathing, Feeding, Dressing Bathing Assistance: Maximum assistance Feeding assistance: Limited assistance Dressing Assistance: Maximum assistance     Functional Limitations Info             SPECIAL CARE FACTORS FREQUENCY  PT  (By licensed PT), OT (By licensed OT)     PT Frequency: 5x/week OT Frequency: 2x/week            Contractures Contractures Info: Not present    Additional Factors Info  Code Status, Allergies Code Status Info: Full code Allergies Info: NKA           Current Medications (05/21/2017):  This is the current hospital active medication list Current Facility-Administered Medications  Medication Dose Route Frequency Provider Last Rate Last Dose  . 0.9 %  sodium chloride infusion   Intravenous Continuous Lashunda Greis K, MD 75 mL/hr at 05/20/17 2233    . acetaminophen (TYLENOL) tablet 650 mg  650 mg Oral Q6H PRN Opyd, Lavone Neriimothy S, MD       Or  . acetaminophen (TYLENOL) suppository 650 mg  650 mg Rectal Q6H PRN Opyd, Lavone Neriimothy S, MD      . bisacodyl (DULCOLAX) EC tablet 5 mg  5 mg Oral Daily PRN Opyd, Lavone Neriimothy S, MD   5 mg at 05/20/17 1307  . clopidogrel (PLAVIX) tablet 75 mg  75 mg Oral Daily Opyd, Lavone Neriimothy S, MD   75 mg at 05/20/17 1306  . guaiFENesin (MUCINEX) 12 hr tablet 600 mg  600 mg Oral BID Rupal Childress K, MD   600 mg at 05/20/17 2234  . heparin injection 5,000 Units  5,000 Units Subcutaneous Q8H Opyd, Lavone Neriimothy S, MD   5,000 Units at 05/21/17 0544  . HYDROcodone-acetaminophen (NORCO/VICODIN) 5-325  MG per tablet 1-2 tablet  1-2 tablet Oral Q4H PRN Opyd, Lavone Neriimothy S, MD   2 tablet at 05/21/17 0558  . insulin aspart (novoLOG) injection 0-5 Units  0-5 Units Subcutaneous QHS Opyd, Timothy S, MD      . insulin aspart (novoLOG) injection 0-9 Units  0-9 Units Subcutaneous TID WC Opyd, Lavone Neriimothy S, MD      . mineral oil-hydrophilic petrolatum (AQUAPHOR) ointment   Topical BID PRN Preet Perrier K, MD      . ondansetron (ZOFRAN) tablet 4 mg  4 mg Oral Q6H PRN Opyd, Lavone Neriimothy S, MD       Or  . ondansetron (ZOFRAN) injection 4 mg  4 mg Intravenous Q6H PRN Opyd, Lavone Neriimothy S, MD      . senna-docusate (Senokot-S) tablet 1 tablet  1 tablet Oral QHS PRN Opyd, Lavone Neriimothy S, MD   1 tablet at 05/20/17 1307  .  simvastatin (ZOCOR) tablet 20 mg  20 mg Oral q1800 Opyd, Lavone Neriimothy S, MD   20 mg at 05/20/17 1307  . vitamin B-12 (CYANOCOBALAMIN) tablet 100 mcg  100 mcg Oral Daily Opyd, Lavone Neriimothy S, MD   100 mcg at 05/20/17 1306     Discharge Medications: Please see discharge summary for a list of discharge medications.  Relevant Imaging Results:  Relevant Lab Results:   Additional Information SS#[id number]  Mearl LatinNadia S Rayyan, LCSWA

## 2017-05-21 NOTE — Progress Notes (Signed)
CSW spoke with patient's daughter and son at bedside to answer SNF questions. They reported understanding that [facility] is the only available SNF in network with [employer] that has beds for tomorrow. They have started insurance authorization.   Osborne Cascoadia Elkin Belfield LCSWA [phone]
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
## 2017-05-21 NOTE — Progress Notes (Signed)
Dr. Isidoro Donningrai paged to notify BS was 67 gave orange juice 120ml and now 75.

## 2017-05-21 NOTE — Plan of Care (Signed)
  Progressing Clinical Measurements: Ability to maintain clinical measurements within normal limits will improve 05/21/2017 1727 - Progressing by Angela Nevinharleston, Duchess Armendarez, RN Activity: Risk for activity intolerance will decrease 05/21/2017 1727 - Progressing by Angela Nevinharleston, Trang Bouse, RN

## 2017-05-21 NOTE — Progress Notes (Signed)
Physical Therapy Treatment Patient Details Name: Mark Bullock MRN: [id number] DOB: 27-Apr-1929 Today's Date: 05/21/2017    History of Present Illness 83 y.o. male with medical history significant for history of CVA with persistent left-sided weakness, hypertension, chronic kidney disease stage III, and type 2 diabetes mellitus, now presenting to the emergency department with generalized weakness.  Patient has reportedly been suffering from progressive generalized weakness and deconditioning and recently had home health established, but unfortunately they found the patient to be a hoarder and was unable to enter his home secondary to this.  Patient was seen by his neighbors today crawling, and they called EMS out for evaluation.  He reports that he has been unable to walk due to generalized weakness.     PT Comments    Pt mobilizing better today than on Friday. Has difficulty flexing knee due to swelling which makes it hard for him to perform sit to stand because of the posterior lean caused by his feet being out in front of him. So, Mod A +2 to stand but once up, was able to ambulate 7' with RW and min A +2 for chair behind. Pt would like to go home but still not safe to be alone at this point. PT will continue to follow.   Follow Up Recommendations  SNF;Supervision/Assistance - 24 hour     Equipment Recommendations  Wheelchair (measurements PT);Wheelchair cushion (measurements PT)    Recommendations for Other Services       Precautions / Restrictions Precautions Precautions: Fall Precaution Comments: significant swelling B LE's Restrictions Weight Bearing Restrictions: No    Mobility  Bed Mobility Overal bed mobility: Needs Assistance Bed Mobility: Supine to Sit     Supine to sit: Supervision     General bed mobility comments: pt needed increased time and use of rail but was able to get to EOB without physical assist  Transfers Overall transfer level: Needs  assistance Equipment used: Rolling walker (2 wheeled) Transfers: Sit to/from Stand Sit to Stand: Mod assist;+2 physical assistance         General transfer comment: +2 mod A for power up and fwd wt shift because pt has difficulty flexing knees (due to swelling) to get feet under him. Min A to control descent to chair. vc's for hand placement each way  Ambulation/Gait Ambulation/Gait assistance: Min assist;+2 safety/equipment Ambulation Distance (Feet): 7 Feet Assistive device: Rolling walker (2 wheeled) Gait Pattern/deviations: Wide base of support;Step-through pattern;Decreased stride length;Trunk flexed Gait velocity: Decreased Gait velocity interpretation: <1.8 ft/sec, indicative of risk for recurrent falls General Gait Details: assisted pt to don shoes (he does not like to walk without shoes). Shuffling gait with chair brought behind   Stairs            Wheelchair Mobility    Modified Rankin (Stroke Patients Only)       Balance Overall balance assessment: Needs assistance Sitting-balance support: No upper extremity supported;Feet supported Sitting balance-Leahy Scale: Good     Standing balance support: Bilateral upper extremity supported;During functional activity Standing balance-Leahy Scale: Poor Standing balance comment: heavy reliance on RW and external support                            Cognition Arousal/Alertness: Awake/alert Behavior During Therapy: WFL for tasks assessed/performed Overall Cognitive Status: No family/caregiver present to determine baseline cognitive functioning  General Comments: Very intense. Verbose and tangential speech. Slow processing. Likes to tell joked, often doesn't listen because he is trying to think of a joke to share      Exercises General Exercises - Lower Extremity Ankle Circles/Pumps: AROM;Both;15 reps;Seated    General Comments        Pertinent  Vitals/Pain Pain Assessment: No/denies pain    Home Living                      Prior Function            PT Goals (current goals can now be found in the care plan section) Acute Rehab PT Goals Patient Stated Goal: to go home  PT Goal Formulation: With patient Time For Goal Achievement: 06/01/17 Potential to Achieve Goals: Fair Progress towards PT goals: Progressing toward goals    Frequency    Min 2X/week      PT Plan Current plan remains appropriate    Co-evaluation              AM-PAC PT "6 Clicks" Daily Activity  Outcome Measure  Difficulty turning over in bed (including adjusting bedclothes, sheets and blankets)?: A Little Difficulty moving from lying on back to sitting on the side of the bed? : A Little Difficulty sitting down on and standing up from a chair with arms (e.g., wheelchair, bedside commode, etc,.)?: Unable Help needed moving to and from a bed to chair (including a wheelchair)?: A Little Help needed walking in hospital room?: A Little Help needed climbing 3-5 steps with a railing? : A Lot 6 Click Score: 15    End of Session Equipment Utilized During Treatment: Gait belt Activity Tolerance: Patient tolerated treatment well Patient left: in chair;with call bell/phone within reach Nurse Communication: Mobility status PT Visit Diagnosis: Other abnormalities of gait and mobility (R26.89);Muscle weakness (generalized) (M62.81);Pain     Time: 1203-1224 PT Time Calculation (min) (ACUTE ONLY): 21 min  Charges:  $Gait Training: 8-22 mins                    G Codes:       .Lyanne CoVictoria Sylus Stgermain, PT  Acute Rehab Services  [phone]    Benetta SparVictoria L Deah Ottaway 05/21/2017, 1:01 PM

## 2017-05-21 NOTE — NC FL2 (Signed)
[place] MEDICAID FL2 LEVEL OF CARE SCREENING TOOL     IDENTIFICATION  Patient Name: Mark Bullock Birthdate: 1928-08-12 Sex: male Admission Date (Current Location): 05/17/2017  [facility] [place]   Facility and Address:  [facility] [place], [address], [place], [place] [address]      Provider Number: [id number]  Attending Physician Name and Address:  Cathren Harshai, Ripudeep K, MD  Relative Name and Phone Number:       Current Level of Care: Hospital Recommended Level of Care: Skilled Nursing Facility Prior Approval Number:    Date Approved/Denied:   PASRR Number: [id number]  Discharge Plan: SNF    Current Diagnoses: Patient Active Problem List   Diagnosis Date Noted  . Rhabdomyolysis 05/19/2017  . Diabetes mellitus without complication (HCC) 05/18/2017  . AKI (acute kidney injury) (HCC) 05/18/2017  . Elevated troponin 05/18/2017  . Hypertension 05/18/2017  . History of completed stroke 05/18/2017  . CKD (chronic kidney disease), stage III (HCC) 05/18/2017  . Weakness   . Physical deconditioning 05/08/2017  . Dependent edema 05/08/2017  . Diaphoresis 05/08/2017  . Osteoarthritis 05/08/2017  . Bilateral lower extremity edema   . Abnormal EKG   . Elevated brain natriuretic peptide (BNP) level     Orientation RESPIRATION BLADDER Height & Weight     Self, Place  Normal Incontinent Weight: 216 lb 6.4 oz (98.2 kg) Height:  5\' 8"  (172.7 cm)  BEHAVIORAL SYMPTOMS/MOOD NEUROLOGICAL BOWEL NUTRITION STATUS  (N/A)   Continent Diet(Heart Room)  AMBULATORY STATUS COMMUNICATION OF NEEDS Skin   Extensive Assist Verbally Normal                       Personal Care Assistance Level of Assistance  Bathing, Feeding, Dressing Bathing Assistance: Maximum assistance Feeding assistance: Limited assistance Dressing Assistance: Maximum assistance     Functional Limitations Info             SPECIAL CARE FACTORS FREQUENCY  PT  (By licensed PT), OT (By licensed OT)     PT Frequency: 5x/week OT Frequency: 2x/week            Contractures Contractures Info: Not present    Additional Factors Info  Code Status, Allergies Code Status Info: Full code Allergies Info: NKA           Current Medications (05/21/2017):  This is the current hospital active medication list Current Facility-Administered Medications  Medication Dose Route Frequency Provider Last Rate Last Dose  . 0.9 %  sodium chloride infusion   Intravenous Continuous Rai, Ripudeep K, MD 75 mL/hr at 05/20/17 2233    . acetaminophen (TYLENOL) tablet 650 mg  650 mg Oral Q6H PRN Opyd, Lavone Neriimothy S, MD       Or  . acetaminophen (TYLENOL) suppository 650 mg  650 mg Rectal Q6H PRN Opyd, Lavone Neriimothy S, MD      . bisacodyl (DULCOLAX) EC tablet 5 mg  5 mg Oral Daily PRN Opyd, Lavone Neriimothy S, MD   5 mg at 05/20/17 1307  . clopidogrel (PLAVIX) tablet 75 mg  75 mg Oral Daily Opyd, Lavone Neriimothy S, MD   75 mg at 05/21/17 0947  . guaiFENesin (MUCINEX) 12 hr tablet 600 mg  600 mg Oral BID Rai, Ripudeep K, MD   600 mg at 05/21/17 0947  . heparin injection 5,000 Units  5,000 Units Subcutaneous Q8H Opyd, Lavone Neriimothy S, MD   5,000 Units at 05/21/17 0544  . HYDROcodone-acetaminophen (NORCO/VICODIN) 5-325  MG per tablet 1-2 tablet  1-2 tablet Oral Q4H PRN Opyd, Lavone Neriimothy S, MD   2 tablet at 05/21/17 0558  . insulin aspart (novoLOG) injection 0-5 Units  0-5 Units Subcutaneous QHS Opyd, Timothy S, MD      . insulin aspart (novoLOG) injection 0-9 Units  0-9 Units Subcutaneous TID WC Opyd, Lavone Neriimothy S, MD      . mineral oil-hydrophilic petrolatum (AQUAPHOR) ointment   Topical BID PRN Rai, Ripudeep K, MD      . ondansetron (ZOFRAN) tablet 4 mg  4 mg Oral Q6H PRN Opyd, Lavone Neriimothy S, MD       Or  . ondansetron (ZOFRAN) injection 4 mg  4 mg Intravenous Q6H PRN Opyd, Lavone Neriimothy S, MD      . senna-docusate (Senokot-S) tablet 1 tablet  1 tablet Oral QHS PRN Opyd, Lavone Neriimothy S, MD   1 tablet at 05/20/17 1307  .  simvastatin (ZOCOR) tablet 20 mg  20 mg Oral q1800 Opyd, Lavone Neriimothy S, MD   20 mg at 05/20/17 1307  . vitamin B-12 (CYANOCOBALAMIN) tablet 100 mcg  100 mcg Oral Daily Opyd, Lavone Neriimothy S, MD   100 mcg at 05/21/17 [id number]     Discharge Medications: Please see discharge summary for a list of discharge medications.  Relevant Imaging Results:  Relevant Lab Results:   Additional Information SS#[id number]  Burna SisUris, Novalyn Lajara H, [place]

## 2017-05-22 LAB — GLUCOSE, CAPILLARY
GLUCOSE-CAPILLARY: 69 mg/dL (ref 65–99)
GLUCOSE-CAPILLARY: 91 mg/dL (ref 65–99)
GLUCOSE-CAPILLARY: 91 mg/dL (ref 65–99)
GLUCOSE-CAPILLARY: 133 mg/dL — AB (ref 65–99)
GLUCOSE-CAPILLARY: 110 mg/dL — AB (ref 65–99)

## 2017-05-22 NOTE — Progress Notes (Signed)
Hypoglycemic Event  CBG: 69  Treatment: 15 GM carbohydrate snack  Symptoms: None  Follow-up CBG: Time:0947 CBG Result:91  Possible Reasons for Event: Inadequate meal intake  Comments/MD notified:Yes.    Lorene Dyaven I Jett Fukuda
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
## 2017-05-22 NOTE — Progress Notes (Signed)
Mark Bullock has not reached a decision yet on SNF approval. [facility] unable to accept LOG due to possibility of patient not being approved.   Osborne Cascoadia Willmar Stockinger LCSWA [phone]

## 2017-05-23 DIAGNOSIS — I10 Essential (primary) hypertension: Secondary | ICD-10-CM

## 2017-05-23 DIAGNOSIS — N183 Chronic kidney disease, stage 3 (moderate): Secondary | ICD-10-CM

## 2017-05-23 DIAGNOSIS — R531 Weakness: Secondary | ICD-10-CM

## 2017-05-23 DIAGNOSIS — R5381 Other malaise: Secondary | ICD-10-CM

## 2017-05-23 DIAGNOSIS — R748 Abnormal levels of other serum enzymes: Secondary | ICD-10-CM

## 2017-05-23 DIAGNOSIS — M6282 Rhabdomyolysis: Secondary | ICD-10-CM

## 2017-05-23 DIAGNOSIS — Z8673 Personal history of transient ischemic attack (TIA), and cerebral infarction without residual deficits: Secondary | ICD-10-CM

## 2017-05-23 DIAGNOSIS — N179 Acute kidney failure, unspecified: Principal | ICD-10-CM

## 2017-05-23 DIAGNOSIS — E119 Type 2 diabetes mellitus without complications: Secondary | ICD-10-CM

## 2017-05-23 LAB — CBC
HEMATOCRIT: 40.6 % (ref 39.0–52.0)
HEMOGLOBIN: 13 g/dL (ref 13.0–17.0)
MCH: 29.4 pg (ref 26.0–34.0)
MCHC: 32 g/dL (ref 30.0–36.0)
MCV: 91.9 fL (ref 78.0–100.0)
Platelets: 193 10*3/uL (ref 150–400)
RBC: 4.42 MIL/uL (ref 4.22–5.81)
RDW: 14.2 % (ref 11.5–15.5)
WBC: 5.5 10*3/uL (ref 4.0–10.5)

## 2017-05-23 LAB — COMPREHENSIVE METABOLIC PANEL
ALK PHOS: 52 U/L (ref 38–126)
ALT: 17 U/L (ref 17–63)
ANION GAP: 4 — AB (ref 5–15)
AST: 25 U/L (ref 15–41)
Albumin: 2.9 g/dL — ABNORMAL LOW (ref 3.5–5.0)
BILIRUBIN TOTAL: 0.7 mg/dL (ref 0.3–1.2)
BUN: 8 mg/dL (ref 6–20)
CALCIUM: 8.9 mg/dL (ref 8.9–10.3)
CO2: 30 mmol/L (ref 22–32)
CREATININE: 1.16 mg/dL (ref 0.61–1.24)
Chloride: 105 mmol/L (ref 101–111)
GFR calc Af Amer: >60 mL/min (ref >60)
GFR, EST NON AFRICAN AMERICAN: 55 mL/min — AB (ref >60)
GLUCOSE: 93 mg/dL (ref 65–99)
Potassium: 4.1 mmol/L (ref 3.5–5.1)
Sodium: 139 mmol/L (ref 135–145)
TOTAL PROTEIN: 6.1 g/dL — AB (ref 6.5–8.1)

## 2017-05-23 LAB — GLUCOSE, CAPILLARY
Glucose-Capillary: 80 mg/dL (ref 65–99)
Glucose-Capillary: 103 mg/dL — ABNORMAL HIGH (ref 65–99)

## 2017-05-23 NOTE — Progress Notes (Signed)
Nsg Discharge Note  Admit Date:  05/17/2017 Discharge date: 05/23/2017   [facility] per MD order.  AVS completed.  Copy for chart, and copy for patient signed, and dated. Patient/caregiver able to verbalize understanding.  Discharge Medication: Allergies as of 05/23/2017   No Known Allergies     Medication List    STOP taking these medications   doxycycline 100 MG capsule Commonly known as:  VIBRAMYCIN   furosemide 40 MG tablet Commonly known as:  LASIX   glimepiride 1 MG tablet Commonly known as:  AMARYL   KLOR-CON M10 10 MEQ tablet Generic drug:  potassium chloride   lisinopril 5 MG tablet Commonly known as:  PRINIVIL,ZESTRIL   traMADol 50 MG tablet Commonly known as:  ULTRAM     TAKE these medications   clopidogrel 75 MG tablet Commonly known as:  PLAVIX Take 75 mg by mouth daily.   guaiFENesin 600 MG 12 hr tablet Commonly known as:  MUCINEX Take 1 tablet (600 mg total) 2 (two) times daily by mouth.   simvastatin 20 MG tablet Commonly known as:  ZOCOR Take 20 mg by mouth daily.   vitamin B-12 100 MCG tablet Commonly known as:  CYANOCOBALAMIN Take 100 mcg by mouth daily.       Discharge Assessment: Vitals:   05/22/17 2134 05/23/17 0541  BP: (!) 149/61 (!) 146/71  Pulse: 60 (!) 55  Resp: 18 17  Temp: 98.9 F (37.2 C) 97.7 F (36.5 C)  SpO2: 98% 94%   Skin clean, dry and intact without evidence of skin break down, no evidence of skin tears noted. IV catheter discontinued intact. Site without signs and symptoms of complications - no redness or edema noted at insertion site, patient denies c/o pain - only slight tenderness at site.  Dressing with slight pressure applied.  D/c Instructions-Education: Discharge instructions given to PTAR transported with packet.   Tucker Minter Consuella Loselaine, RN 05/23/2017 5:12 PM

## 2017-05-23 NOTE — Care Management Important Message (Signed)
Important Message  Patient Details  Name: Mark MustardHenry W Storlie MRN: [id number] Date of Birth: [birth year]   Medicare Important Message Given:  Yes    Diamante Truszkowski Abena 05/23/2017, 11:00 AM

## 2017-05-23 NOTE — Clinical Social Work Placement (Signed)
   CLINICAL SOCIAL WORK PLACEMENT  NOTE  Date:  05/23/2017  Patient Details  Name: Mark Bullock MRN: [id number] Date of Birth: July 22, 1928  Clinical Social Work is seeking post-discharge placement for this patient at the Skilled  Nursing Facility level of care (*CSW will initial, date and re-position this form in  chart as items are completed):  Yes   Patient/family provided with [facility] Clinical Social Work Department's list of facilities offering this level of care within the geographic area requested by the patient (or if unable, by the patient's family).  Yes   Patient/family informed of their freedom to choose among providers that offer the needed level of care, that participate in Medicare, Medicaid or managed care program needed by the patient, have an available bed and are willing to accept the patient.  Yes   Patient/family informed of [facility]'s ownership interest [facility] and [facility], as well as of the fact that they are under no obligation to receive care at these facilities.  PASRR submitted to EDS on       PASRR number received on       Existing PASRR number confirmed on 05/21/17     FL2 transmitted to all facilities in geographic area requested by pt/family on 05/21/17     FL2 transmitted to all facilities within larger geographic area on       Patient informed that his/her managed care company has contracts with or will negotiate with certain facilities, including the following:        Yes   Patient/family informed of bed offers received.  [facility]     Physician recommends and patient chooses bed at      [facility] on 05/23/17.  Patient to be transferred to facility by PTAR     Patient family notified on 05/23/17 of transfer.  Name of family member notified:  Daughter, Para MarchJeanette     PHYSICIAN Please sign FL2, Please prepare priority  discharge summary, including medications     Additional Comment:    _______________________________________________ Mearl LatinNadia S Venise Ellingwood, LCSWA 05/23/2017, 9:06 AM

## 2017-05-23 NOTE — Progress Notes (Signed)
Attempted report to [facility] no answer.

## 2017-05-23 NOTE — Progress Notes (Signed)
[facility] has received authorization for patient to discharge there today.  Osborne Cascoadia Devonna Oboyle LCSWA [phone]
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
## 2017-05-23 NOTE — Progress Notes (Signed)
Patient will DC to: [facility] Anticipated DC date: 05/23/17 Family notified: Daughter Transport by: Sherlyn HayPTAR 3pm   Per MD patient ready for DC to [facility]. RN, patient, patient's family, and facility notified of DC. Discharge Summary sent to facility. RN given number for report [phone] Room 118). DC packet on chart. Ambulance transport requested for patient.   CSW signing off.  Cristobal GoldmannNadia Isair Inabinet, [place] Clinical Social Worker [phone]

## 2017-05-24 ENCOUNTER — Non-Acute Institutional Stay (SKILLED_NURSING_FACILITY): Payer: Medicare HMO | Admitting: Adult Health

## 2017-05-24 ENCOUNTER — Encounter: Payer: Self-pay | Admitting: Adult Health

## 2017-05-24 DIAGNOSIS — I10 Essential (primary) hypertension: Secondary | ICD-10-CM | POA: Diagnosis not present

## 2017-05-24 DIAGNOSIS — E538 Deficiency of other specified B group vitamins: Secondary | ICD-10-CM | POA: Diagnosis not present

## 2017-05-24 DIAGNOSIS — E1122 Type 2 diabetes mellitus with diabetic chronic kidney disease: Secondary | ICD-10-CM | POA: Diagnosis not present

## 2017-05-24 DIAGNOSIS — E785 Hyperlipidemia, unspecified: Secondary | ICD-10-CM | POA: Diagnosis not present

## 2017-05-24 DIAGNOSIS — F0150 Vascular dementia without behavioral disturbance: Secondary | ICD-10-CM | POA: Diagnosis not present

## 2017-05-24 DIAGNOSIS — N183 Chronic kidney disease, stage 3 unspecified: Secondary | ICD-10-CM

## 2017-05-24 DIAGNOSIS — R600 Localized edema: Secondary | ICD-10-CM | POA: Diagnosis not present

## 2017-05-24 DIAGNOSIS — M6282 Rhabdomyolysis: Secondary | ICD-10-CM | POA: Diagnosis not present

## 2017-05-24 DIAGNOSIS — E1169 Type 2 diabetes mellitus with other specified complication: Secondary | ICD-10-CM

## 2017-05-24 DIAGNOSIS — R5381 Other malaise: Secondary | ICD-10-CM

## 2017-05-24 DIAGNOSIS — N1831 Chronic kidney disease, stage 3a: Secondary | ICD-10-CM | POA: Insufficient documentation
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
## 2017-05-28 ENCOUNTER — Non-Acute Institutional Stay (SKILLED_NURSING_FACILITY): Payer: Medicare HMO | Admitting: Internal Medicine

## 2017-05-28 ENCOUNTER — Encounter: Payer: Self-pay | Admitting: Internal Medicine

## 2017-05-28 DIAGNOSIS — M6282 Rhabdomyolysis: Secondary | ICD-10-CM

## 2017-05-28 DIAGNOSIS — R600 Localized edema: Secondary | ICD-10-CM

## 2017-05-28 DIAGNOSIS — F0150 Vascular dementia without behavioral disturbance: Secondary | ICD-10-CM

## 2017-05-28 DIAGNOSIS — E1122 Type 2 diabetes mellitus with diabetic chronic kidney disease: Secondary | ICD-10-CM

## 2017-05-28 DIAGNOSIS — I10 Essential (primary) hypertension: Secondary | ICD-10-CM

## 2017-05-28 DIAGNOSIS — R5381 Other malaise: Secondary | ICD-10-CM | POA: Diagnosis not present

## 2017-05-28 DIAGNOSIS — N183 Chronic kidney disease, stage 3 unspecified: Secondary | ICD-10-CM
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
## 2017-06-19 NOTE — Progress Notes (Signed)
Entered in error

## 2017-06-21 ENCOUNTER — Encounter: Payer: Self-pay | Admitting: Adult Health

## 2017-06-21 ENCOUNTER — Non-Acute Institutional Stay (SKILLED_NURSING_FACILITY): Payer: Medicare HMO | Admitting: Adult Health

## 2017-06-21 DIAGNOSIS — E1169 Type 2 diabetes mellitus with other specified complication: Secondary | ICD-10-CM | POA: Diagnosis not present

## 2017-06-21 DIAGNOSIS — E1122 Type 2 diabetes mellitus with diabetic chronic kidney disease: Secondary | ICD-10-CM

## 2017-06-21 DIAGNOSIS — N183 Chronic kidney disease, stage 3 (moderate): Secondary | ICD-10-CM

## 2017-06-21 DIAGNOSIS — I6930 Unspecified sequelae of cerebral infarction: Secondary | ICD-10-CM | POA: Diagnosis not present

## 2017-06-21 DIAGNOSIS — E538 Deficiency of other specified B group vitamins: Secondary | ICD-10-CM | POA: Diagnosis not present

## 2017-06-21 DIAGNOSIS — E785 Hyperlipidemia, unspecified: Secondary | ICD-10-CM | POA: Diagnosis not present
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
## 2017-07-07 DIAGNOSIS — I6930 Unspecified sequelae of cerebral infarction: Secondary | ICD-10-CM | POA: Insufficient documentation

## 2017-07-11 ENCOUNTER — Non-Acute Institutional Stay (SKILLED_NURSING_FACILITY): Payer: Medicare Other | Admitting: Adult Health

## 2017-07-11 ENCOUNTER — Encounter: Payer: Self-pay | Admitting: Adult Health

## 2017-07-11 DIAGNOSIS — E1122 Type 2 diabetes mellitus with diabetic chronic kidney disease: Secondary | ICD-10-CM

## 2017-07-11 DIAGNOSIS — N183 Chronic kidney disease, stage 3 unspecified: Secondary | ICD-10-CM

## 2017-07-11 DIAGNOSIS — F0150 Vascular dementia without behavioral disturbance: Secondary | ICD-10-CM

## 2017-07-11 DIAGNOSIS — I10 Essential (primary) hypertension: Secondary | ICD-10-CM | POA: Diagnosis not present

## 2017-07-11 DIAGNOSIS — I6930 Unspecified sequelae of cerebral infarction: Secondary | ICD-10-CM | POA: Diagnosis not present

## 2017-07-11 NOTE — Progress Notes (Signed)
Location:   [facility] Room Number: 118 A Place of Service:  SNF (31)    CODE STATUS: Full Code  No Known Allergies  Chief Complaint  Patient presents with  . Discharge Note    Discharging to home    HPI:  He is being discharged to home with home for pt/ot/rn. He will need a front wheel walker and high rise commode seat. He will need his prescriptions written and will need to follow up with his medical provider.  He had been hospitalized for increased weakness; acute renal failure. He was admitted to this facility for short term rehab.  This is not a recommended discharge from a medical perspective. He has however decided to go home any; so I will help to ensure that his discharge is as safe as possible.    Past Medical History:  Diagnosis Date  . AKI (acute kidney injury) (HCC) 05/18/2017  . Diabetes mellitus without complication (HCC)   . Hyperlipidemia   . Hypertension   . Left-sided weakness   . Stroke [facility])     Past Surgical History:  Procedure Laterality Date  . APPENDECTOMY    . KNEE SURGERY      Social History   Socioeconomic History  . Marital status: Divorced    Spouse name: Not on file  . Number of children: Not on file  . Years of education: Not on file  . Highest education level: Not on file  Social Needs  . Financial resource strain: Not on file  . Food insecurity - worry: Not on file  . Food insecurity - inability: Not on file  . Transportation needs - medical: Not on file  . Transportation needs - non-medical: Not on file  Occupational History  . Not on file  Tobacco Use  . Smoking status: Never Smoker  . Smokeless tobacco: Never Used  Substance and Sexual Activity  . Alcohol use: No  . Drug use: No  . Sexual activity: Not on file  Other Topics Concern  . Not on file  Social History Narrative  . Not on file   Family History  Family history unknown: Yes    VITAL SIGNS BP 129/68   Pulse 74   Temp 98.5 F (36.9 C)    Resp 17   Ht 5\' 9"  (1.753 m)   Wt 220 lb 4.8 oz (99.9 kg)   SpO2 97%   BMI 32.53 kg/m     Medication List        Accurate as of 07/11/17 10:03 AM. Always use your most recent med list.          acetaminophen 325 MG tablet Commonly known as:  TYLENOL   clopidogrel 75 MG tablet Commonly known as:  PLAVIX   docusate sodium 100 MG capsule Commonly known as:  COLACE   guaiFENesin 600 MG 12 hr tablet Commonly known as:  MUCINEX Take 1 tablet (600 mg total) 2 (two) times daily by mouth.   simvastatin 20 MG tablet Commonly known as:  ZOCOR   vitamin B-12 100 MCG tablet Commonly known as:  CYANOCOBALAMIN        SIGNIFICANT DIAGNOSTIC EXAMS  PREVIOUS   05-17-17: chest x-ray: Elevation of right hemidiaphragm. No evidence of active pulmonary Disease.  05-17-17: ct of head: 1. Stable exam.  No acute intracranial abnormality. 2. Atrophy with chronic small vessel white matter ischemic disease.  05-10-17: pelvic x-ray: 1. No definite acute osseous abnormality 2. Multiple metallic fragments over the right  pelvis, left hip and left femur.  05-18-17: 2-d echo:   - Left ventricle: Wall thickness was increased in a pattern of mild LVH. There was focal basal hypertrophy. Systolic function was mildly to moderately reduced. The estimated ejection fraction was in the range of 40% to 45%. Doppler parameters are consistent with abnormal left ventricular relaxation (grade 1 diastolic dysfunction). - Aortic valve: Mildly calcified annulus. - Mitral valve: There was mild regurgitation. - Right atrium: The atrium was mildly dilated. - Pulmonary arteries: Systolic pressure was moderately increased. PA peak pressure: 47 mm Hg (S).   NO NEW EXAMS   LABS REVIEWED: PREVIOUS:  05-17-17:wbc 11.8; hgb 13.6; hct 40.7; mcv 88.5; plt 185; glucose 123; bun 23; creat 1.82; k+ 3.8; na++ 137; ca 9.0; total bili 2.2; albumin 3.8; mag 2.1 05-18-17: wbc 10.2; hgb 13.0; hct 39.0; mcv 88.6; plt 174; glucose 109;  bun 22; creat 1.63; k+ 3.4; na++ 137; ca 9.0; tsh 1.653; hgb a1c 5.3; vit B 12: 428; RPR nr; HIV: nr; CK 2794 05-19-17: CK 2229 05-23-17: wbc 5.5; hgb 13.0; hct 40.6; mcv 91.9; plt 193; glucose 93; bun 8; creat 1.16; k+ 4.1; na++ 139; ca 8.9; liver normal albumin 2.9 05-28-17:CK 49  NO NEW LABS     Review of Systems  Reason unable to perform ROS: poor historian; does not like to answer questions   Constitutional: Negative for malaise/fatigue.  Respiratory: Negative for cough.   Cardiovascular: Negative for chest pain.  Gastrointestinal: Negative for abdominal pain.  Musculoskeletal: Negative for back pain.  Skin: Negative.   Neurological: Negative for dizziness.  Psychiatric/Behavioral: The patient is not nervous/anxious.     Physical Exam  Constitutional: He appears well-developed and well-nourished. No distress.  Thin   Neck: No thyromegaly present.  Cardiovascular: Normal rate, regular rhythm and intact distal pulses.  Murmur heard. 1/6  Abdominal: Soft. Bowel sounds are normal. He exhibits no distension. There is no tenderness.  Musculoskeletal: He exhibits edema.  Able to move all extremities Has 2+ lower extremity edema  Has mild left side weakness    Lymphadenopathy:    He has no cervical adenopathy.  Neurological: He is alert.  Has mild left facial droop   Skin: Skin is warm and dry. He is not diaphoretic.  Psychiatric: He has a normal mood and affect.    ASSESSMENT/ PLAN:  Patient is being discharged with the following home health services:  Pt/or/rn/cna/sw: to evaluate and treat as indicated for gait balance strength adl training medication management adl care and community outreach   Patient is being discharged with the following durable medical equipment:  Front wheel walker to allow him to maintain his current level of independence with his adls. High right commode   Patient has been advised to f/u with their PCP in 1-2 weeks to bring them up to date on  their rehab stay.  Social services at facility was responsible for arranging this appointment.  Pt was provided with a 30 day supply of prescriptions for medications and refills must be obtained from their PCP.  For controlled substances, a more limited supply may be provided adequate until PCP appointment only.  A 30 day supply of his prescriptions written for a 30 day supply of his medications per the list as above   40 minutes spent with patient: discussed home health needs; expectations; medications and equipment. He has verbalized understanding.    Synthia Innocenteborah Green NP [facility]  Contact [phone] Monday through Friday 8am- 5pm  After  hours call [phone]

## 2018-01-10 ENCOUNTER — Emergency Department (HOSPITAL_COMMUNITY)
Admission: EM | Admit: 2018-01-10 | Discharge: 2018-01-10 | Disposition: A | Discharge status: Home / Self Care | Payer: Medicare Other | Attending: Emergency Medicine | Admitting: Emergency Medicine

## 2018-01-10 ENCOUNTER — Encounter (HOSPITAL_COMMUNITY): Payer: Self-pay | Admitting: Emergency Medicine

## 2018-01-10 ENCOUNTER — Emergency Department (HOSPITAL_COMMUNITY): Payer: Medicare Other

## 2018-01-10 ENCOUNTER — Other Ambulatory Visit: Payer: Self-pay

## 2018-01-10 DIAGNOSIS — Z7902 Long term (current) use of antithrombotics/antiplatelets: Secondary | ICD-10-CM | POA: Insufficient documentation

## 2018-01-10 DIAGNOSIS — E1122 Type 2 diabetes mellitus with diabetic chronic kidney disease: Secondary | ICD-10-CM | POA: Insufficient documentation

## 2018-01-10 DIAGNOSIS — R2243 Localized swelling, mass and lump, lower limb, bilateral: Secondary | ICD-10-CM | POA: Diagnosis not present

## 2018-01-10 DIAGNOSIS — N289 Disorder of kidney and ureter, unspecified: Secondary | ICD-10-CM

## 2018-01-10 DIAGNOSIS — Z8673 Personal history of transient ischemic attack (TIA), and cerebral infarction without residual deficits: Secondary | ICD-10-CM | POA: Diagnosis not present

## 2018-01-10 DIAGNOSIS — R600 Localized edema: Secondary | ICD-10-CM

## 2018-01-10 DIAGNOSIS — I129 Hypertensive chronic kidney disease with stage 1 through stage 4 chronic kidney disease, or unspecified chronic kidney disease: Secondary | ICD-10-CM | POA: Insufficient documentation

## 2018-01-10 DIAGNOSIS — N189 Chronic kidney disease, unspecified: Secondary | ICD-10-CM

## 2018-01-10 DIAGNOSIS — Z79899 Other long term (current) drug therapy: Secondary | ICD-10-CM | POA: Diagnosis not present

## 2018-01-10 DIAGNOSIS — N183 Chronic kidney disease, stage 3 (moderate): Secondary | ICD-10-CM | POA: Diagnosis not present

## 2018-01-10 DIAGNOSIS — M79604 Pain in right leg: Secondary | ICD-10-CM | POA: Diagnosis present

## 2018-01-10 LAB — COMPREHENSIVE METABOLIC PANEL
ALBUMIN: 3.5 g/dL (ref 3.5–5.0)
ALK PHOS: 50 U/L (ref 38–126)
ALT: 12 U/L (ref 0–44)
AST: 32 U/L (ref 15–41)
Anion gap: 3 — ABNORMAL LOW (ref 5–15)
BILIRUBIN TOTAL: 2.3 mg/dL — AB (ref 0.3–1.2)
BUN: 17 mg/dL (ref 8–23)
CALCIUM: 9.2 mg/dL (ref 8.9–10.3)
CO2: 32 mmol/L (ref 22–32)
Chloride: 109 mmol/L (ref 98–111)
Creatinine, Ser: 1.47 mg/dL — ABNORMAL HIGH (ref 0.61–1.24)
GFR calc Af Amer: 48 mL/min — ABNORMAL LOW (ref >60)
GFR calc non Af Amer: 41 mL/min — ABNORMAL LOW (ref >60)
GLUCOSE: 103 mg/dL — AB (ref 70–99)
Potassium: 3.6 mmol/L (ref 3.5–5.1)
Sodium: 144 mmol/L (ref 135–145)
TOTAL PROTEIN: 7 g/dL (ref 6.5–8.1)

## 2018-01-10 LAB — CBC
HEMATOCRIT: 40.1 % (ref 39.0–52.0)
HEMOGLOBIN: 13.2 g/dL (ref 13.0–17.0)
MCH: 29.9 pg (ref 26.0–34.0)
MCHC: 32.9 g/dL (ref 30.0–36.0)
MCV: 90.7 fL (ref 78.0–100.0)
Platelets: 192 10*3/uL (ref 150–400)
RBC: 4.42 MIL/uL (ref 4.22–5.81)
RDW: 13.7 % (ref 11.5–15.5)
WBC: 7.5 10*3/uL (ref 4.0–10.5)

## 2018-01-10 MED ORDER — SODIUM CHLORIDE 0.9 % IV BOLUS
500.0000 mL | Freq: Once | INTRAVENOUS | Status: AC
  Administered 2018-01-10: 500 mL via INTRAVENOUS

## 2018-01-10 MED ORDER — TRAMADOL HCL 50 MG PO TABS: 50.0000 mg | ORAL_TABLET | Freq: Four times a day (QID) | ORAL | 0 refills | Status: DC | PRN

## 2018-01-10 NOTE — Discharge Instructions (Addendum)
Work-up here today showed a little bit worsening kidney function.  Discussed with internal medicine on call.  They recommend a little fluid challenge and close follow-up with your regular doctor on the kidney function.  Need to follow-up with your doctor next week.  Also they thought that she would have normally been on Lasix but we do not see that ordered.  Would follow-up with your regular doctor to see whether they want to continue Lasix.  Return for any new or worse symptoms.

## 2018-01-10 NOTE — ED Provider Notes (Signed)
[facility]-EMERGENCY DEPT Provider Note   CSN: [id number] Arrival date & time: 01/10/18  1044     History   Chief Complaint Chief Complaint  Patient presents with  . Leg Pain    HPI Mark [facility] is a 84 y.o. male.  She brought in by EMS from home due to worsening bilateral leg pain and swelling.  Patient has known chronic edema.  EMS states that he takes a water pill.  But none is listed.  But it when he was discharged after being admitted for acute kidney injury in November 2018 he was discharged on Lasix.  So perhaps that our list is not up-to-date.  Patient denies any shortness of breath any chest pain any abdominal pain.  Patient's primary care doctor is Dr. Su Hilt.     Past Medical History:  Diagnosis Date  . AKI (acute kidney injury) (HCC) 05/18/2017  . Diabetes mellitus without complication (HCC)   . Hyperlipidemia   . Hypertension   . Left-sided weakness   . Stroke [facility])     Patient Active Problem List   Diagnosis Date Noted  . Chronic cerebrovascular accident (CVA) 07/07/2017  . Essential hypertension, benign 05/24/2017  . Type II diabetes mellitus with stage 3 chronic kidney disease (HCC) 05/24/2017  . Dyslipidemia associated with type 2 diabetes mellitus (HCC) 05/24/2017  . Vascular dementia without behavioral disturbance 05/24/2017  . Vitamin B 12 deficiency 05/24/2017  . Rhabdomyolysis 05/19/2017  . AKI (acute kidney injury) (HCC) 05/18/2017  . Elevated troponin 05/18/2017  . History of completed stroke 05/18/2017  . CKD (chronic kidney disease), stage III (HCC) 05/18/2017  . Physical deconditioning 05/08/2017  . Osteoarthritis 05/08/2017  . Bilateral lower extremity edema   . Abnormal EKG   . Elevated brain natriuretic peptide (BNP) level     Past Surgical History:  Procedure Laterality Date  . APPENDECTOMY    . KNEE SURGERY          Home Medications    Prior to Admission medications   Medication Sig Start  Date End Date Taking? Authorizing Provider  acetaminophen (TYLENOL) 325 MG tablet Take 650 mg every 6 (six) hours as needed by mouth for mild pain or moderate pain. 05/25/17   [provider]  clopidogrel (PLAVIX) 75 MG tablet Take 75 mg by mouth daily. 01/22/15   [provider]  docusate sodium (COLACE) 100 MG capsule Take 100 mg by mouth 2 (two) times daily.    [provider]  guaiFENesin (MUCINEX) 600 MG 12 hr tablet Take 1 tablet (600 mg total) 2 (two) times daily by mouth. 05/21/17   Rai, Ripudeep K, MD  simvastatin (ZOCOR) 20 MG tablet Take 20 mg by mouth daily. 01/22/15   [provider]  vitamin B-12 (CYANOCOBALAMIN) 100 MCG tablet Take 100 mcg by mouth daily.    [provider]    Family History Family History  Family history unknown: Yes    Social History Social History   Tobacco Use  . Smoking status: Never Smoker  . Smokeless tobacco: Never Used  Substance Use Topics  . Alcohol use: No  . Drug use: No     Allergies   Patient has no known allergies.   Review of Systems Review of Systems  Constitutional: Negative for fever.  HENT: Negative for congestion.   Eyes: Negative for visual disturbance.  Respiratory: Negative for shortness of breath.   Cardiovascular: Positive for leg swelling. Negative for chest pain.  Gastrointestinal: Negative for  abdominal pain.  Genitourinary: Negative for dysuria.  Musculoskeletal: Negative for back pain.  Skin: Positive for rash.  Neurological: Negative for headaches.  Hematological: Does not bruise/bleed easily.  Psychiatric/Behavioral: Negative for confusion.     Physical Exam Updated Vital Signs BP 126/83   Pulse 71   Temp 97.9 F (36.6 C) (Oral)   Resp 16   SpO2 92%   Physical Exam  Constitutional: He is oriented to person, place, and time. He appears well-developed and well-nourished. No distress.  HENT:  Head: Normocephalic and atraumatic.  Mucous membranes  slightly dry.  Eyes: Pupils are equal, round, and reactive to light. EOM are normal.  Neck: Normal range of motion.  Cardiovascular: Normal rate, regular rhythm and normal heart sounds.  Pulmonary/Chest: Effort normal and breath sounds normal. No respiratory distress.  Abdominal: Soft. Bowel sounds are normal. He exhibits no distension. There is no tenderness.  Musculoskeletal: He exhibits edema.  Marked edema to both legs.  With chronic skin changes suggestive of chronic long-standing edema.  Neurological: He is alert and oriented to person, place, and time. No cranial nerve deficit. He exhibits normal muscle tone. Coordination normal.  Skin: Skin is warm. There is erythema.  Nursing note and vitals reviewed.    ED Treatments / Results  Labs (all labs ordered are listed, but only abnormal results are displayed) Labs Reviewed  COMPREHENSIVE METABOLIC PANEL - Abnormal; Notable for the following components:      Result Value   Glucose, Bld 103 (*)    Creatinine, Ser 1.47 (*)    Total Bilirubin 2.3 (*)    GFR calc non Af Amer 41 (*)    GFR calc Af Amer 48 (*)    Anion gap 3 (*)    All other components within normal limits  CBC    EKG None  Radiology Dg Chest 2 View  Result Date: 01/10/2018 CLINICAL DATA:  Leg swelling. EXAM: CHEST - 2 VIEW COMPARISON:  05/17/2017 FINDINGS: Stable asymmetric elevation right hemidiaphragm. The lungs are clear without focal pneumonia, edema, pneumothorax or pleural effusion. Calcified granuloma again noted left lung base. Cardiopericardial silhouette is at upper limits of normal for size. The visualized bony structures of the thorax are intact. IMPRESSION: Stable.  No acute findings. Electronically Signed   By: Kennith CenterEric  Mansell M.D.   On: 01/10/2018 12:48    Procedures Procedures (including critical care time)  Medications Ordered in ED Medications  sodium chloride 0.9 % bolus 500 mL (500 mLs Intravenous New Bag/Given 01/10/18 1528)     Initial  Impression / Assessment and Plan / ED Course  I have reviewed the triage vital signs and the nursing notes.  Pertinent labs & imaging results that were available during my care of the patient were reviewed by me and considered in my medical decision making (see chart for details).     Patient with some worsening renal function on his baseline.  Normally his creatinines are like 1.16 or 1.14.  Today he is 1 evident.  BUN elevated some to suggestive of a little bit of a prerenal picture.  Patient back in the fall was admitted for acute kidney injury when his creatinine got to 1.8.  These findings discussed with on-call hospitalist and they felt that he did not warrant admission for the today's current numbers.  We decided to go ahead and give a little bit of fluid close follow-up with his regular doctor.  Also they would have expected and I would have to that he  would have been continued on Lasix.  Patient does not seem to be on Lasix according to his med list.  We will have him check with his regular doctor to see if they want to restart Lasix for the chronic leg swelling.  The leg swelling is bilateral so not worried about any  Blood clot or any venous obstructive type picture.   Chest x-ray negative for any pulmonary edema or any other significant ab normalities.  In addition there is an isolated elevation in the bilirubin LFTs otherwise were normal.  Again this can be followed up by primary care doctor.    Final Clinical Impressions(s) / ED Diagnoses   Final diagnoses:  Bilateral leg edema  Acute on chronic renal insufficiency    ED Discharge Orders    None       Vanetta Mulders, MD 01/10/18 1542

## 2018-01-10 NOTE — ED Triage Notes (Signed)
Per EMS pt from home with worsening bilateral leg pain for past few days; chronic edema and "takes a water pill." Denies injury.

## 2018-01-10 NOTE — ED Notes (Signed)
Bed: [id number] Expected date: 01/10/18 Expected time: 10:36 AM Means of arrival: Ambulance Comments: Bil leg pain, edema

## 2018-01-18 ENCOUNTER — Emergency Department (HOSPITAL_COMMUNITY)
Admission: EM | Admit: 2018-01-18 | Discharge: 2018-01-18 | Disposition: A | Discharge status: Home / Self Care | Payer: Medicare Other | Attending: Emergency Medicine | Admitting: Emergency Medicine

## 2018-01-18 ENCOUNTER — Other Ambulatory Visit: Payer: Self-pay

## 2018-01-18 ENCOUNTER — Encounter (HOSPITAL_COMMUNITY): Payer: Self-pay | Admitting: Emergency Medicine

## 2018-01-18 DIAGNOSIS — I129 Hypertensive chronic kidney disease with stage 1 through stage 4 chronic kidney disease, or unspecified chronic kidney disease: Secondary | ICD-10-CM | POA: Insufficient documentation

## 2018-01-18 DIAGNOSIS — Z79899 Other long term (current) drug therapy: Secondary | ICD-10-CM | POA: Insufficient documentation

## 2018-01-18 DIAGNOSIS — E119 Type 2 diabetes mellitus without complications: Secondary | ICD-10-CM | POA: Insufficient documentation

## 2018-01-18 DIAGNOSIS — M79604 Pain in right leg: Secondary | ICD-10-CM | POA: Diagnosis present

## 2018-01-18 DIAGNOSIS — B879 Myiasis, unspecified: Secondary | ICD-10-CM | POA: Insufficient documentation

## 2018-01-18 DIAGNOSIS — N183 Chronic kidney disease, stage 3 (moderate): Secondary | ICD-10-CM | POA: Insufficient documentation

## 2018-01-18 DIAGNOSIS — R609 Edema, unspecified: Secondary | ICD-10-CM | POA: Diagnosis not present

## 2018-01-18 DIAGNOSIS — Z7902 Long term (current) use of antithrombotics/antiplatelets: Secondary | ICD-10-CM | POA: Insufficient documentation

## 2018-01-18 LAB — COMPREHENSIVE METABOLIC PANEL
ALBUMIN: 3.4 g/dL — AB (ref 3.5–5.0)
ALK PHOS: 53 U/L (ref 38–126)
ALT: 10 U/L (ref 0–44)
ANION GAP: 7 (ref 5–15)
AST: 17 U/L (ref 15–41)
BILIRUBIN TOTAL: 1.1 mg/dL (ref 0.3–1.2)
BUN: 12 mg/dL (ref 8–23)
CALCIUM: 8.9 mg/dL (ref 8.9–10.3)
CO2: 32 mmol/L (ref 22–32)
Chloride: 102 mmol/L (ref 98–111)
Creatinine, Ser: 1.44 mg/dL — ABNORMAL HIGH (ref 0.61–1.24)
GFR calc non Af Amer: 42 mL/min — ABNORMAL LOW (ref >60)
GFR, EST AFRICAN AMERICAN: 49 mL/min — AB (ref >60)
GLUCOSE: 89 mg/dL (ref 70–99)
Potassium: 4.2 mmol/L (ref 3.5–5.1)
SODIUM: 141 mmol/L (ref 135–145)
TOTAL PROTEIN: 7.1 g/dL (ref 6.5–8.1)

## 2018-01-18 LAB — CBC
HCT: 39.9 % (ref 39.0–52.0)
HEMOGLOBIN: 12.8 g/dL — AB (ref 13.0–17.0)
MCH: 29.1 pg (ref 26.0–34.0)
MCHC: 32.1 g/dL (ref 30.0–36.0)
MCV: 90.7 fL (ref 78.0–100.0)
Platelets: 222 10*3/uL (ref 150–400)
RBC: 4.4 MIL/uL (ref 4.22–5.81)
RDW: 13.6 % (ref 11.5–15.5)
WBC: 7.3 10*3/uL (ref 4.0–10.5)

## 2018-01-18 LAB — URINALYSIS, ROUTINE W REFLEX MICROSCOPIC
BILIRUBIN URINE: NEGATIVE
Glucose, UA: NEGATIVE mg/dL
HGB URINE DIPSTICK: NEGATIVE
Ketones, ur: NEGATIVE mg/dL
Leukocytes, UA: NEGATIVE
NITRITE: NEGATIVE
PROTEIN: NEGATIVE mg/dL
Specific Gravity, Urine: 1.014 (ref 1.005–1.030)
pH: 5 (ref 5.0–8.0)

## 2018-01-18 LAB — I-STAT CG4 LACTIC ACID, ED: LACTIC ACID, VENOUS: 1.31 mmol/L (ref 0.5–1.9)

## 2018-01-18 LAB — BRAIN NATRIURETIC PEPTIDE: B Natriuretic Peptide: 61.8 pg/mL (ref 0.0–100.0)

## 2018-01-18 MED ORDER — ACETAMINOPHEN 325 MG PO TABS
650.0000 mg | ORAL_TABLET | Freq: Once | ORAL | Status: AC
  Administered 2018-01-18: 650 mg via ORAL
  Filled 2018-01-18: qty 2

## 2018-01-18 MED ORDER — FUROSEMIDE 10 MG/ML IJ SOLN
40.0000 mg | Freq: Once | INTRAMUSCULAR | Status: AC
  Administered 2018-01-18: 40 mg via INTRAVENOUS
  Filled 2018-01-18: qty 4

## 2018-01-18 MED ORDER — FUROSEMIDE 40 MG PO TABS: 40.0000 mg | ORAL_TABLET | Freq: Every day | ORAL | 4 refills | Status: DC

## 2018-01-18 NOTE — Care Management Note (Signed)
Case Management Note  Patient Details  Name: Mark Bullock MRN: [id number] Date of Birth: June 06, 1929  CM consulted again by CSW regarding [facility], SNF, and HH questions.  CM spoke with daughter Mark [facility] who was requesting pt go to [facility] today with private pay and is aware of the price ranges for costs of SNF and [facility].  CM contacted [facility] team, [facility], Sarah, [facility], who advised pt is not appropriate for [facility] due to his level of wound care needs, which is why he not already at this facility. [facility] advised pt needs SNF.  Their [facility] and [facility]; pt has been to this facility in the past.  CSW was unable to reach [facility] to find out bed availability, and also unable to find another facility with a bed available tonight.  Pt's daughter decided to [facility] to find out if there was a bed, cost, and fill out paperwork.  While pt's daughter was driving to [facility], a return call from them reported no male beds available until Sunday.  Cost was $275/day and had to be paid on day 1 for the first 30 days for a total of $8,250.00 in the form of a cashier's check.  CM called pt's daughter Mark [facility]ock at [phone] and left a message with the information.  CM contacted [facility] to update and advised him that [facility] would need to be available for pt tomorrow and possibly Sun, as well as a CSW to assist with any further placement needs.  [facility] reports a nurse will see the pt tomorrow.  [facility] also reported that [facility] at [facility] has significant concerns about pt returning home and then refusing SNF on Sun.  CM advised that was the pt's choice and she could file an APS report if there was concern about the home living conditions.  CM updated daughter, [facility] on return.  She advised that she was not going to take the pt to [facility] on Sunday due to cost and that she found another [facility] that would accept him at a cheaper cost.  CM  explained that [facility] was not a SNF, that they were the same as [facility].  Either facility would have to do an evaluation of his wounds to determine if they would accept him, neither of which could happen until Monday at the earliest.  CM advised her that the [facility] team and his PCP would assist with the FL2 and PPD testing at this time and any further placement needs.  CM and daughter again spoke with [place]Beth from [facility] on the phone.  [facility] advised pt's PCP, [facility], was aware of what was going on and plans were made to be seen by him on either Monday or Tuesday.  Mark [facility] also said that if either of the [facility] would not accept the pt at their facility after wound evaluation, [facility] would assist with placing the [facility] at [facility].  Updated [facility] and [facility] with [facility].  No further CM needs noted at this time.   Expected Discharge Date:   01/18/2018               Expected Discharge Plan:  Home w Home Health Services  Discharge planning Services  CM Consult  Post Acute Care Choice:  Home Health Choice offered to:  Patient, Adult Children  HH Arranged:  RN, Social Work [facility]  Status of Service:  Completed, signed off  [facility], Mark Sandhoff, RN 01/18/2018, 4:20 PM

## 2018-01-18 NOTE — ED Notes (Signed)
Bed: WA21 Expected date:  Expected time:  Means of arrival:  Comments: EMS-infected wound

## 2018-01-18 NOTE — Consult Note (Addendum)
WOC Nurse wound consult note Reason for Consult: Consult requested for right leg wound.  Pt was reported to have maggots by the home health nurse.  There are a few scattered white ones remaining when the wound was assessed. Wound type: Right leg with generalized edema Measurement: 2 areas of full thickness wounds to posterior leg; 1X1X.2cm and 3X3X.2cm Wound bed: red and moist Drainage (amount, consistency, odor) small amt tan drainage, slight odor Periwound: white macerated wound edges Dressing procedure/placement/frequency: Cleanse with chlorhexidene wipes each time to attempt to eliminate maggots, which have a cyclitic hatching time; expect more to evolve which may be below the skin level at this time.  Xeroform gauze and kerlex will help contain them and promote drying and healing of the wound bed.  Discussed plan of care with patient. Please re-consult if further assistance is needed.  Thank-you,  Cammie Mcgeeawn Deasia Chiu MSN, RN, CWOCN, [place]CN-AP, CNS [phone]

## 2018-01-18 NOTE — Progress Notes (Signed)
2nd shift ED CSW was updated by the 1st shift ED CSW and RN CM and then met with pt's family at pt's family's request and provided active listening and validated pt's daughter's feeling regarding difficulties experienced by pt's family in understanding the SNF/ALF process.  CSW reinforced what pt's had been informed by the 1st shift [facility] ED CSW and the [facility] ED RN CM and assisted the pt's daughter with finding assistance from [facility] ED staff with helping the pt into the pt's family's vehicle.  Pt's daughter was appreciative and thanked the CSW.  Please reconsult if future social work needs arise.  CSW signing off, as social work intervention is no longer needed.  [facility]. Mark Perusse, LCSW, LCAS, CSI Clinical Social Worker Ph: [phone]

## 2018-01-18 NOTE — ED Provider Notes (Signed)
[facility]-EMERGENCY DEPT Provider Note   CSN: [id number] Arrival date & time: 01/18/18  1031     History   Chief Complaint Chief Complaint  Patient presents with  . Wound Infection    HPI Mark Bullock is a 84 y.o. male.  HPI Patient presented to the emergency room for evaluation of leg swelling and possible wound infection.  Patient has a history of chronic leg swelling.  He was recently in the emergency room on July 4.  At that time he was evaluated for bilateral leg edema and acute on chronic renal insufficiency.  Patient had a wound care nurse evaluate the patient today.  They noted that the patient had multiple maggots crawling on his lower extremity.  They sent him to the ED for further evaluation.  Patient denies any trouble with chest pain or shortness of breath.  No fevers or chills. Past Medical History:  Diagnosis Date  . AKI (acute kidney injury) (HCC) 05/18/2017  . Diabetes mellitus without complication (HCC)   . Hyperlipidemia   . Hypertension   . Left-sided weakness   . Stroke [facility])     Patient Active Problem List   Diagnosis Date Noted  . Chronic cerebrovascular accident (CVA) 07/07/2017  . Essential hypertension, benign 05/24/2017  . Type II diabetes mellitus with stage 3 chronic kidney disease (HCC) 05/24/2017  . Dyslipidemia associated with type 2 diabetes mellitus (HCC) 05/24/2017  . Vascular dementia without behavioral disturbance 05/24/2017  . Vitamin B 12 deficiency 05/24/2017  . Rhabdomyolysis 05/19/2017  . AKI (acute kidney injury) (HCC) 05/18/2017  . Elevated troponin 05/18/2017  . History of completed stroke 05/18/2017  . CKD (chronic kidney disease), stage III (HCC) 05/18/2017  . Physical deconditioning 05/08/2017  . Osteoarthritis 05/08/2017  . Bilateral lower extremity edema   . Abnormal EKG   . Elevated brain natriuretic peptide (BNP) level     Past Surgical History:  Procedure Laterality Date  .  APPENDECTOMY    . KNEE SURGERY          Home Medications    Prior to Admission medications   Medication Sig Start Date End Date Taking? Authorizing Provider  acetaminophen (TYLENOL) 325 MG tablet Take 650 mg every 6 (six) hours as needed by mouth for mild pain or moderate pain. 05/25/17  Yes [provider]  clopidogrel (PLAVIX) 75 MG tablet Take 75 mg by mouth daily. 01/22/15  Yes [provider]  docusate sodium (COLACE) 100 MG capsule Take 100 mg by mouth 2 (two) times daily.   Yes [provider]  furosemide (LASIX) 40 MG tablet Take 40 mg by mouth daily. 11/17/17  Yes [provider]  guaiFENesin (MUCINEX) 600 MG 12 hr tablet Take 1 tablet (600 mg total) 2 (two) times daily by mouth. 05/21/17  Yes Rai, Ripudeep K, MD  lisinopril (PRINIVIL,ZESTRIL) 5 MG tablet Take 5 mg by mouth daily. 12/20/17  Yes [provider]  potassium chloride (K-DUR) 10 MEQ tablet TAKE 1 TABLET BY MOUTH DAILY WITH LASIX 12/20/17  Yes [provider]  simvastatin (ZOCOR) 20 MG tablet Take 20 mg by mouth daily. 01/22/15  Yes [provider]  traMADol (ULTRAM) 50 MG tablet Take 1 tablet (50 mg total) by mouth every 6 (six) hours as needed. 01/10/18  Yes Vanetta Mulders, MD  vitamin B-12 (CYANOCOBALAMIN) 100 MCG tablet Take 100 mcg by mouth daily.   Yes [provider]    Family History Family History  Family history unknown:  Yes    Social History Social History   Tobacco Use  . Smoking status: Never Smoker  . Smokeless tobacco: Never Used  Substance Use Topics  . Alcohol use: No  . Drug use: No     Allergies   Patient has no known allergies.   Review of Systems Review of Systems  All other systems reviewed and are negative.    Physical Exam Updated Vital Signs BP (!) 127/97   Pulse 78   Temp 98.7 F (37.1 C) (Oral)   Resp (!) 28   Ht 1.727 m (5\' 8" )   Wt 103.4 kg (228 lb)   SpO2 100%   BMI 34.67 kg/m   Physical  Exam  Constitutional: No distress.  Elderly, frail, disheveled, smells of urine  HENT:  Head: Normocephalic and atraumatic.  Right Ear: External ear normal.  Left Ear: External ear normal.  Eyes: Conjunctivae are normal. Right eye exhibits no discharge. Left eye exhibits no discharge. No scleral icterus.  Neck: Neck supple. No tracheal deviation present.  Cardiovascular: Normal rate, regular rhythm and intact distal pulses.  Pulmonary/Chest: Effort normal and breath sounds normal. No stridor. No respiratory distress. He has no wheezes. He has no rales.  Abdominal: Soft. Bowel sounds are normal. He exhibits no distension. There is no tenderness. There is no rebound and no guarding.  Genitourinary:  Genitourinary Comments: Scrotal edema noted, no erythema  Musculoskeletal: He exhibits edema. He exhibits no tenderness.  Chronic lymphedema in bilateral lower extremities, patient's legs are at least twice the size of normal, he has evidence of scrotal edema, serous fluid weeping from the lower extremities, some of the tissue is macerated but no deep ulcerations noted, no purulent drainage, several maggots noted to be crawling on the skin surface, no discrete ulcerative wound on exam  Neurological: He is alert. He has normal strength. No cranial nerve deficit (no facial droop, extraocular movements intact, no slurred speech) or sensory deficit. He exhibits normal muscle tone. He displays no seizure activity. Coordination normal.  Skin: Skin is warm and dry. No rash noted. He is not diaphoretic.  Psychiatric: He has a normal mood and affect.  Nursing note and vitals reviewed.    ED Treatments / Results  Labs (all labs ordered are listed, but only abnormal results are displayed) Labs Reviewed  CBC - Abnormal; Notable for the following components:      Result Value   Hemoglobin 12.8 (*)    All other components within normal limits  COMPREHENSIVE METABOLIC PANEL - Abnormal; Notable for the  following components:   Creatinine, Ser 1.44 (*)    Albumin 3.4 (*)    GFR calc non Af Amer 42 (*)    GFR calc Af Amer 49 (*)    All other components within normal limits  BRAIN NATRIURETIC PEPTIDE  URINALYSIS, ROUTINE W REFLEX MICROSCOPIC  I-STAT CG4 LACTIC ACID, ED     Procedures Procedures (including critical care time)  Medications Ordered in ED Medications  furosemide (LASIX) injection 40 mg (40 mg Intravenous Given 01/18/18 1446)  acetaminophen (TYLENOL) tablet 650 mg (650 mg Oral Given 01/18/18 1445)     Initial Impression / Assessment and Plan / ED Course  I have reviewed the triage vital signs and the nursing notes.  Pertinent labs & imaging results that were available during my care of the patient were reviewed by me and considered in my medical decision making (see chart for details).  Clinical Course as of Jan 18 1530  Fri Jan 18, 2018  1402 Labs consistent with his chronic renal insufficiency.  CBC and lactic acid level are normal.  No signs of systemic infection.   [JK]  1454 I attempted to contact Dr Su Hiltoberts at his office.  Office is closed at this time.   [JK]  1456 D/w Dr Catha GosselinMikhail.  Pt is not febrile.  No signs of acute infection.  Can be managed as an outpatient.    [JK]  1500 Pt needs continued local wound care and daily dressing.  Pt would benefit from daily bathing.  Will have social work stop by to answer family questions about an assisted living facility.  Pt already has home health    [JK]    Clinical Course User Index [JK] Linwood DibblesKnapp, Shayden Gingrich, MD   Patient presented to the emergency room for evaluation of maggots noted on wounds in his lower extremities.  Patient has a history of severe peripheral edema that has been getting worse.  When the home health nurse checked on him today they noticed several maggots on his wounds.  Patient does not have any deep ulcerations but he has macerated tissue in his lower extremity.  Wound care nurse assessed the patient in the  emergency room.  She recommended several measures to help with the wounds.  Patient does have severe peripheral edema.  Patient will need to continue his diuretics.  He likely would benefit from edema dressings and wraps.  Patient's daughter is interested in him being in assisted living facility.  Social worker was involved and they contacted  the home health service.  Final Clinical Impressions(s) / ED Diagnoses   Final diagnoses:  Peripheral edema  Maggot infestation    ED Discharge Orders    None       Linwood DibblesKnapp, Willi Borowiak, MD 01/18/18 [phone]

## 2018-01-18 NOTE — Care Management Note (Signed)
Case Management Note  CM consulted by charge RN Stacy.  CM noted pt is active with HH.  Spoke with pt and one of his daughters at bedside who report [facility].  CM contacted [name] [facility] who will follow pt for needs.  No further CM needs noted at this time.  Hilja Kintzel, Lynnae SandhoffAngela N, RN 01/18/2018, 12:19 PM

## 2018-01-18 NOTE — Discharge Instructions (Addendum)
For home health: Cleanse with chlorhexidene wipes each time to attempt to eliminate maggots, which have a cyclitic hatching time; expect more to evolve which may be below the skin level at this time.  Xeroform gauze and kerlex will help contain them and promote drying and healing of the wound bed.   Change dressings daily.  Make sure to wash legs daily.  Follow up with your doctor to have the wounds rechecked.  Monitor for signs of fever, worsening symptoms.

## 2018-01-18 NOTE — Progress Notes (Addendum)
[facility] CSW received [facility] about [facility] at 3:45 PM stating that pt's family wants pt to go to SNF and family is willing to pay out of pocket. ED disposition is for pt to transition home with home health. Pt is receiving [facility] services [facility]. Family is willing to pay out of pocket, per CM, for SNF. SNF placement can be sought from home health social worker since family is paying out of pocket.   Update 4:45: CSW received phone [facility] from [place] [facility]. Pt's daughter is there. [name] does not have bed availability until Sunday.   Montine CircleKelsy Chellsea Beckers, Silverio LayLCSWA [facility]  [phone]

## 2018-01-18 NOTE — ED Triage Notes (Addendum)
Pt BIB [facility] from home for wound on rt lower leg. Home health nurse notice maggots in wound and called EMS. Pt reports pain 3/10. Pt is able to ambulate with assistance, but not well. Pt has hx of DM, states he doesn't check his sugar often. EMS reports poor living conditions that most likely contributed to wound infection

## 2018-01-18 NOTE — ED Notes (Signed)
Pt pending discharge Pt dtr stated that she had to look at some  ALF and would be back, Dtr stated that she would return in 30 minutes has AD Toniann FailWendy speak with pt Dtr and made her aware that usually  We do not hold pt but would make exception in the meantime.

## 2018-01-21 ENCOUNTER — Encounter (HOSPITAL_COMMUNITY): Payer: Self-pay

## 2018-01-21 ENCOUNTER — Other Ambulatory Visit: Payer: Self-pay

## 2018-01-21 ENCOUNTER — Emergency Department (HOSPITAL_COMMUNITY): Payer: Medicare Other

## 2018-01-21 ENCOUNTER — Emergency Department (HOSPITAL_BASED_OUTPATIENT_CLINIC_OR_DEPARTMENT_OTHER): Payer: Medicare Other

## 2018-01-21 ENCOUNTER — Emergency Department (HOSPITAL_COMMUNITY)
Admission: EM | Admit: 2018-01-21 | Discharge: 2018-01-22 | Disposition: A | Discharge status: Home / Self Care | Payer: Medicare Other | Attending: Emergency Medicine | Admitting: Emergency Medicine

## 2018-01-21 DIAGNOSIS — E1122 Type 2 diabetes mellitus with diabetic chronic kidney disease: Secondary | ICD-10-CM | POA: Insufficient documentation

## 2018-01-21 DIAGNOSIS — Z743 Need for continuous supervision: Secondary | ICD-10-CM | POA: Diagnosis not present

## 2018-01-21 DIAGNOSIS — Z7902 Long term (current) use of antithrombotics/antiplatelets: Secondary | ICD-10-CM | POA: Diagnosis not present

## 2018-01-21 DIAGNOSIS — I129 Hypertensive chronic kidney disease with stage 1 through stage 4 chronic kidney disease, or unspecified chronic kidney disease: Secondary | ICD-10-CM | POA: Insufficient documentation

## 2018-01-21 DIAGNOSIS — N183 Chronic kidney disease, stage 3 (moderate): Secondary | ICD-10-CM | POA: Diagnosis not present

## 2018-01-21 DIAGNOSIS — R609 Edema, unspecified: Secondary | ICD-10-CM

## 2018-01-21 DIAGNOSIS — T679XXA Effect of heat and light, unspecified, initial encounter: Secondary | ICD-10-CM | POA: Diagnosis present

## 2018-01-21 DIAGNOSIS — R600 Localized edema: Secondary | ICD-10-CM | POA: Diagnosis not present

## 2018-01-21 DIAGNOSIS — Z79899 Other long term (current) drug therapy: Secondary | ICD-10-CM | POA: Diagnosis not present

## 2018-01-21 LAB — COMPREHENSIVE METABOLIC PANEL
ALK PHOS: 51 U/L (ref 38–126)
ALT: 8 U/L (ref 0–44)
ANION GAP: 8 (ref 5–15)
AST: 19 U/L (ref 15–41)
Albumin: 3.1 g/dL — ABNORMAL LOW (ref 3.5–5.0)
BUN: 13 mg/dL (ref 8–23)
CALCIUM: 8.7 mg/dL — AB (ref 8.9–10.3)
CO2: 29 mmol/L (ref 22–32)
Chloride: 106 mmol/L (ref 98–111)
Creatinine, Ser: 1.46 mg/dL — ABNORMAL HIGH (ref 0.61–1.24)
GFR calc Af Amer: 48 mL/min — ABNORMAL LOW (ref >60)
GFR calc non Af Amer: 41 mL/min — ABNORMAL LOW (ref >60)
Glucose, Bld: 103 mg/dL — ABNORMAL HIGH (ref 70–99)
POTASSIUM: 4 mmol/L (ref 3.5–5.1)
Sodium: 143 mmol/L (ref 135–145)
Total Bilirubin: 1.1 mg/dL (ref 0.3–1.2)
Total Protein: 6.5 g/dL (ref 6.5–8.1)

## 2018-01-21 LAB — URINALYSIS, ROUTINE W REFLEX MICROSCOPIC
BILIRUBIN URINE: NEGATIVE
Glucose, UA: NEGATIVE mg/dL
Hgb urine dipstick: NEGATIVE
Ketones, ur: NEGATIVE mg/dL
Leukocytes, UA: NEGATIVE
Nitrite: NEGATIVE
Protein, ur: NEGATIVE mg/dL
SPECIFIC GRAVITY, URINE: 1.013 (ref 1.005–1.030)
pH: 6 (ref 5.0–8.0)

## 2018-01-21 LAB — CBC WITH DIFFERENTIAL/PLATELET
BASOS ABS: 0 10*3/uL (ref 0.0–0.1)
Basophils Relative: 0 %
Eosinophils Absolute: 0.1 10*3/uL (ref 0.0–0.7)
Eosinophils Relative: 1 %
HEMATOCRIT: 38.7 % — AB (ref 39.0–52.0)
HEMOGLOBIN: 12.5 g/dL — AB (ref 13.0–17.0)
LYMPHS PCT: 11 %
Lymphs Abs: 0.9 10*3/uL (ref 0.7–4.0)
MCH: 29.3 pg (ref 26.0–34.0)
MCHC: 32.3 g/dL (ref 30.0–36.0)
MCV: 90.8 fL (ref 78.0–100.0)
Monocytes Absolute: 0.8 10*3/uL (ref 0.1–1.0)
Monocytes Relative: 9 %
NEUTROS ABS: 6.6 10*3/uL (ref 1.7–7.7)
Neutrophils Relative %: 79 %
Platelets: 196 10*3/uL (ref 150–400)
RBC: 4.26 MIL/uL (ref 4.22–5.81)
RDW: 13.8 % (ref 11.5–15.5)
WBC: 8.3 10*3/uL (ref 4.0–10.5)

## 2018-01-21 LAB — I-STAT TROPONIN, ED: Troponin i, poc: 0.02 ng/mL (ref 0.00–0.08)

## 2018-01-21 LAB — CK: CK TOTAL: 399 U/L — AB (ref 49–397)

## 2018-01-21 NOTE — Progress Notes (Signed)
CSW met with the EDP who states NO new acute, rehab-able changes were observed from pt's current  labs/test results.  CSW staffed pt's case with the CSW Asst Director who stated if pt is willing to go to [facility] and private pay then the [place] [facility] will assist in facilitating the D/C process that is already in place with the pt, the pt's daughters (specifically Dr. Dreama Saa, the daughter who acquired the FL-2) and [place].  If the pt desires to return home and is unwilling to private pay then the pt can D/C home once medically cleared.  CSW met with the pt who is agreeable to private pay and D/C directly to [facility] on 7/16.  Per the pt and the pt's daughter the pt has a check made out to [facility] in his wallet.  CSW received verbal consent from the pt to update his daughters and CSW placed a call and was part of a conference call with pt's daughters Dr. Dreama Saa [phone] and Aretha Ullmer at ph: [phone] and updated them.  Dr. Dreama Saa, pt's daughter agreed to:  1. Be the contact person on 7/16 for the 1st shift ED CSW 2. Has a meeting from 10 am-12noon and will try to call [facility] before then to initiate admission. 3. Will come to the [facility] ED to p/u the $8,000 check to [facility] if it is needed before pt arrives. 4. Is willing to go to [facility] to sign paperwork if necessary. 5. Has procured a FL-2 from pt's PCP and has dropped it off at [facility] on 7/15 for [facility] has already submitted the necessary referral to [facility], per pt's daughter Dr. Nicole Kindred.  Eustaquio Maize is the contact person for [facility] and has been to the pt's home on Sat and Sun, per the pt.  [facility] is willing to keep pt overnight for safety until the pt can D/[facility]  On 7/16.  Please reconsult if future social work needs arise.  CSW signing off, as social work intervention is no longer needed.  Alphonse Guild. Marcina Kinnison, LCSW, LCAS,  CSI Clinical Social Worker Ph: [phone]

## 2018-01-21 NOTE — ED Triage Notes (Signed)
EMS reports from home, called out for possible heat exhaustion, Pt weak and diaphoretic on scene, EMS reports Pt home extremely hot, no ac. C/o leg pain. Here for leg wound last week. Hx of stroke with deficit.  BP 102/60 HR 88 Resp 20 Sp02 92  CBG 127  20ga L forearm NS enroute

## 2018-01-21 NOTE — ED Notes (Signed)
Bed: [id number] Expected date:  Expected time:  Means of arrival:  Comments: 1985.84 yo hypotension

## 2018-01-21 NOTE — Progress Notes (Addendum)
Consult request has been received. CSW attempting to follow up at present time.  CSW is familiar with the pt from pt's previous visits on 7/12 and October and November 2018.  CSW spoke to provider who stated pt is not likely to meet inpatient hospitalization criteria.  Per chart and pt's RN, pt called and then was BIB EMS after being found in his [facility] with the [facility]" and the pt in the hallway in hs wheelchair in an apparent state of heat exhauston. Pt's daughter later stated pt's AC works, pt does not use it.  CSW standing by to await results of pt's labs/tests.  CSW called pt's daughter Blair HaileyJeanette Bishop at ph: [phone] amd left a HIPPA-compliant VM requesting a call back.  7:07 PM CSW recieved a call back from pt's daughter Blair HaileyJeanette Bishop at ph: [phone] who was aware pt had returned, but did not know details.  CSW updated her that per EMS, there was no air conditioning operable at their arrival to the pt's home. Pt's daughter stated pt has a habit of "not running his AC but instead running fans because he (the pt) feels that's how he got his money and that's how he's gonna keep it".  Pt's daughter also stated, "He doesn't look like it but he's got quite a bit more money than you'd think he does".    CSW advised daughter who had not been working closely with the pt's situation like her sisters have been that pt was to, "per the chart and per the daytime CSW's on Friday July 12th the [facility], [facility] or [facility] with the [facility]".  Pt's daughter stated a "Beth" had called and left a message with the pt's daughter and the CSW had advised the pt's daughter that [facility] had a "Beth" in charge and that it was likely her.   CSW advised pt's daughter it was not known at this time whether the pt will be admitted or D/C'd, but the CSW would update pt's  daughter as he knew more. Pt's daughter was unsure why pt was not placed over the weekend but would find out and update the 2nd shift ED CSW.  CSW will continue to follow for D/C needs.  Dorothe PeaJonathan F. Mareon Robinette, LCSW, LCAS, CSI Clinical Social Worker Ph: [phone]

## 2018-01-21 NOTE — ED Notes (Signed)
Pt attempted to walk w walker put not able to lift feet, due to bilateral leg edema

## 2018-01-21 NOTE — ED Provider Notes (Signed)
[facility]-EMERGENCY DEPT Provider Note   CSN: [id number] Arrival date & time: 01/21/18  1441     History   Chief Complaint Chief Complaint  Patient presents with  . Heat Exposure     HPI Mark Bullock is a 84 y.o. male with a hx of T2DM, HTN, hyperlipidemia, stroke w/ residual deficits, rhabdomyolysis, chronic lower extremity edema, and vascular dementia who presents to the ED via EMS for leg pain/swelling/giving out today. Patient states that he has had problems with pains, swelling, and legs giving out bilaterally for 2 months now. He does have a wound to the RLE. He states that he walks with a walker at baseline, today he was ambulating with the walker and he felt that his legs were giving out and that he could not work further therefore he lowered himself to the ground. He denies head injury or LOC. He states he was unable to get up on his own because he felt generally weak. EMS was called. Per EMS report to nursing staff patient was found to be weak and diaphoretic on scene- EMS reports the apartment was very hot with no air conditioning on. He was give 750 NS en route. Patient states he did not call for a heat problem. He additionally denies any dyspnea, weakness, dizziness, lightheadedness, numbness, focal weakness, or change in vision. He states his main complaint is his legs which hurt, 10/10 in severity, has wound to LLE, wound care nurse was at house yesterday. He feels he is unable to walk secondary to pain/swelling and giving out.  Denies fevers.   HPI  Past Medical History:  Diagnosis Date  . AKI (acute kidney injury) (HCC) 05/18/2017  . Diabetes mellitus without complication (HCC)   . Hyperlipidemia   . Hypertension   . Left-sided weakness   . Stroke [facility])     Patient Active Problem List   Diagnosis Date Noted  . Chronic cerebrovascular accident (CVA) 07/07/2017  . Essential hypertension, benign 05/24/2017  . Type II diabetes mellitus with  stage 3 chronic kidney disease (HCC) 05/24/2017  . Dyslipidemia associated with type 2 diabetes mellitus (HCC) 05/24/2017  . Vascular dementia without behavioral disturbance 05/24/2017  . Vitamin B 12 deficiency 05/24/2017  . Rhabdomyolysis 05/19/2017  . AKI (acute kidney injury) (HCC) 05/18/2017  . Elevated troponin 05/18/2017  . History of completed stroke 05/18/2017  . CKD (chronic kidney disease), stage III (HCC) 05/18/2017  . Physical deconditioning 05/08/2017  . Osteoarthritis 05/08/2017  . Bilateral lower extremity edema   . Abnormal EKG   . Elevated brain natriuretic peptide (BNP) level     Past Surgical History:  Procedure Laterality Date  . APPENDECTOMY    . KNEE SURGERY          Home Medications    Prior to Admission medications   Medication Sig Start Date End Date Taking? Authorizing Provider  acetaminophen (TYLENOL) 325 MG tablet Take 650 mg every 6 (six) hours as needed by mouth for mild pain or moderate pain. 05/25/17   [provider]  clopidogrel (PLAVIX) 75 MG tablet Take 75 mg by mouth daily. 01/22/15   [provider]  docusate sodium (COLACE) 100 MG capsule Take 100 mg by mouth 2 (two) times daily.    [provider]  furosemide (LASIX) 40 MG tablet Take 1 tablet (40 mg total) by mouth daily. 01/18/18   Linwood Dibbles, MD  guaiFENesin (MUCINEX) 600 MG 12 hr tablet Take 1 tablet (600 mg total) 2 (  two) times daily by mouth. 05/21/17   Rai, Ripudeep K, MD  lisinopril (PRINIVIL,ZESTRIL) 5 MG tablet Take 5 mg by mouth daily. 12/20/17   [provider]  potassium chloride (K-DUR) 10 MEQ tablet TAKE 1 TABLET BY MOUTH DAILY WITH LASIX 12/20/17   [provider]  simvastatin (ZOCOR) 20 MG tablet Take 20 mg by mouth daily. 01/22/15   [provider]  traMADol (ULTRAM) 50 MG tablet Take 1 tablet (50 mg total) by mouth every 6 (six) hours as needed. 01/10/18   Vanetta MuldersZackowski, Scott, MD  vitamin B-12 (CYANOCOBALAMIN) 100 MCG tablet  Take 100 mcg by mouth daily.    [provider]    Family History Family History  Family history unknown: Yes    Social History Social History   Tobacco Use  . Smoking status: Never Smoker  . Smokeless tobacco: Never Used  Substance Use Topics  . Alcohol use: No  . Drug use: No     Allergies   Patient has no known allergies.   Review of Systems Review of Systems  Constitutional: Positive for diaphoresis (per EMS). Negative for fever.  Respiratory: Negative for shortness of breath.   Cardiovascular: Positive for leg swelling. Negative for chest pain and palpitations.  Gastrointestinal: Negative for abdominal pain, nausea and vomiting.  Musculoskeletal: Positive for myalgias (BLE pain). Negative for back pain and neck pain.  Neurological: Positive for weakness (generalized, non focal). Negative for dizziness, syncope, light-headedness and numbness.  All other systems reviewed and are negative.   Physical Exam Updated Vital Signs BP 120/68   Pulse 86   Temp 97.8 F (36.6 C) (Oral)   Resp 18   SpO2 94%   Physical Exam  Constitutional: He appears well-developed and well-nourished.  Non-toxic appearance. No distress.  HENT:  Head: Normocephalic and atraumatic. Head is without raccoon's eyes and without Battle's sign.  Nose: Nose normal.  Mouth/Throat: Uvula is midline.  Eyes: Pupils are equal, round, and reactive to light. Conjunctivae and EOM are normal. Right eye exhibits no discharge. Left eye exhibits no discharge.  Neck: Normal range of motion. Neck supple. No spinous process tenderness present.  Cardiovascular: Normal rate and regular rhythm.  No murmur heard. Pulses:      Dorsalis pedis pulses are 2+ on the right side, and 2+ on the left side.  Pulmonary/Chest: Breath sounds normal. No respiratory distress. He has no wheezes. He has no rhonchi. He has no rales.  Abdominal: Soft. There is no tenderness. There is no rebound and no guarding.    Genitourinary:  Genitourinary Comments: Scrotal edema present.   Musculoskeletal:  Upper extremities: normal ROM, nontender Back: no midline tenderness to palpation Lower extremities: Patient's lower extremities have appearance of chronic edema, R>L. There is a wound to the RLE with dressing in place. No surrounding/streaking erythema or increased warmth. No purulent drainage. Patient has normal ROM at hips, knees, and ankles bilaterally. Lower extremities are nontender.   Neurological: He is alert.  Clear speech. CN III-XII grossly intact. Sensation grossly intact x 4. 5/5 strength with plantar/dorsiflexion bilaterally. 5/5 grip strength.   Skin: Skin is warm. No rash noted.  Skin somewhat clammy/diaphoretic.   Psychiatric: He has a normal mood and affect. His behavior is normal.  Nursing note and vitals reviewed.  ED Treatments / Results  Labs Results for orders placed or performed during the hospital encounter of 01/21/18  Urinalysis, Routine w reflex microscopic  Result Value Ref Range   Color, Urine YELLOW YELLOW  APPearance CLEAR CLEAR   Specific Gravity, Urine 1.013 1.005 - 1.030   pH 6.0 5.0 - 8.0   Glucose, UA NEGATIVE NEGATIVE mg/dL   Hgb urine dipstick NEGATIVE NEGATIVE   Bilirubin Urine NEGATIVE NEGATIVE   Ketones, ur NEGATIVE NEGATIVE mg/dL   Protein, ur NEGATIVE NEGATIVE mg/dL   Nitrite NEGATIVE NEGATIVE   Leukocytes, UA NEGATIVE NEGATIVE  Comprehensive metabolic panel  Result Value Ref Range   Sodium 143 135 - 145 mmol/L   Potassium 4.0 3.5 - 5.1 mmol/L   Chloride 106 98 - 111 mmol/L   CO2 29 22 - 32 mmol/L   Glucose, Bld 103 (H) 70 - 99 mg/dL   BUN 13 8 - 23 mg/dL   Creatinine, Ser 4.09 (H) 0.61 - 1.24 mg/dL   Calcium 8.7 (L) 8.9 - 10.3 mg/dL   Total Protein 6.5 6.5 - 8.1 g/dL   Albumin 3.1 (L) 3.5 - 5.0 g/dL   AST 19 15 - 41 U/L   ALT 8 0 - 44 U/L   Alkaline Phosphatase 51 38 - 126 U/L   Total Bilirubin 1.1 0.3 - 1.2 mg/dL   GFR calc non Af Amer 41 (L)  >60 mL/min   GFR calc Af Amer 48 (L) >60 mL/min   Anion gap 8 5 - 15  CK  Result Value Ref Range   Total CK 399 (H) 49 - 397 U/L  CBC with Differential  Result Value Ref Range   WBC 8.3 4.0 - 10.5 K/uL   RBC 4.26 4.22 - 5.81 MIL/uL   Hemoglobin 12.5 (L) 13.0 - 17.0 g/dL   HCT 81.1 (L) 91.4 - 78.2 %   MCV 90.8 78.0 - 100.0 fL   MCH 29.3 26.0 - 34.0 pg   MCHC 32.3 30.0 - 36.0 g/dL   RDW 95.6 21.3 - 08.6 %   Platelets 196 150 - 400 K/uL   Neutrophils Relative % 79 %   Neutro Abs 6.6 1.7 - 7.7 K/uL   Lymphocytes Relative 11 %   Lymphs Abs 0.9 0.7 - 4.0 K/uL   Monocytes Relative 9 %   Monocytes Absolute 0.8 0.1 - 1.0 K/uL   Eosinophils Relative 1 %   Eosinophils Absolute 0.1 0.0 - 0.7 K/uL   Basophils Relative 0 %   Basophils Absolute 0.0 0.0 - 0.1 K/uL  I-stat troponin, ED  Result Value Ref Range   Troponin i, poc 0.02 0.00 - 0.08 ng/mL   Comment 3           EKG EKG Interpretation  Date/Time:  Monday January 21 2018 16:20:09 EDT Ventricular Rate:  74 PR Interval:    QRS Duration: 142 QT Interval:  455 QTC Calculation: 505 R Axis:   -64 Text Interpretation:  Unknown rhythm, irregular rate Prolonged PR interval Nonspecific IVCD with LAD Probable anteroseptal infarct, old No significant change since last tracing Confirmed by Shaune Pollack [phone]) on 01/21/2018 5:30:37 PM   Radiology Dg Abdomen Acute W/chest  Result Date: 01/21/2018 CLINICAL DATA:  Concern for heat exhaustion.  Diaphoresis. EXAM: DG ABDOMEN ACUTE W/ 1V CHEST COMPARISON:  Chest radiograph - 01/10/2018 FINDINGS: Grossly unchanged cardiac silhouette and mediastinal contours with atherosclerotic plaque within a tortuous and potentially ectatic thoracic aorta. Unchanged moderate elevation/eventration of the right hemidiaphragm. No focal airspace opacities. No pleural effusion or pneumothorax. No evidence of edema. Large colonic stool burden without evidence of enteric obstruction. No pneumoperitoneum, pneumatosis or  portal venous gas. Presumed shrapnel overlies the lower abdomen. No acute osseus  abnormalities. Mild-to-moderate multilevel lumbar spine DDD. IMPRESSION: 1. No acute cardiopulmonary disease. 2. Large colonic stool burden without evidence of enteric obstruction. Electronically Signed   By: Simonne Come M.D.   On: 01/21/2018 18:27    Procedures Procedures (including critical care time)  Medications Ordered in ED Medications - No data to display   Initial Impression / Assessment and Plan / ED Course  I have reviewed the triage vital signs and the nursing notes.  Pertinent labs & imaging results that were available during my care of the patient were reviewed by me and considered in my medical decision making (see chart for details).   Patient presents to the emergency department via EMS due to concern for bilateral lower extremity pain/swelling/giving out sensation leading to difficulty ambulating- lowered himself to the ground and was unable to get up and walk without assistance, additionally generally weak.  Upon EMS arrival patient's apartment was extremely hot, he appeared diaphoretic, was unable to get up on his own.On exam patient has significant chronic lymphedema with right lower extremity wound present.  There is no overlying erythema, streaking appearance, warmth, or purulent discharge to raise concern for acute infection/cellulitis.  He is neurovascularly intact distally.  Will evaluate with labs, EKG, acute abdomen/chest x-ray, and bilateral lower extremity venous duplex.   Patient's work-up has been reviewed and is grossly unremarkable. Labs appear consistent with baseline.  Creatinine elevated at 1.46, consistent with prior.  Mild hypocalcemia and hypoalbuminemia. Hgb 12.5 consistent with prior. No leucocytosis.  CK 399 does not appear consistent with acute rhabdomyolysis.  UA without obvious infection. EKG without significant change from prior, troponin negative, doubtful of ACS. CXR  negative for infiltrate/ptx/effusion. Abdominal x-ray with stool burden without obstruction. Bilateral venous duplex negative for DVT.   Patient's work-up here in the emergency department appears fairly at baseline without acute change.  Attempted ambulation with the patient and he was unable to take more than one step with his walker which he uses at baseline.  Patient lives at home alone.  Case management is familiar with this patient and has been extensively involved in his care today. Christiane Ha Riffey LCSWA has spoken with patient and family as well as CSW assistant director-patient is willing to go to [facility] skilled nursing facility and private pay. He is agreeable to discharge directly to [facility] tomorrow AM (07/16). Patient's daughter Synetta Fail aware and in agreement with plan.   Plan for observation in the emergency department overnight with repeat CSW consultation in the morning to facilitate disposition to [facility]. Patient's home medications have been ordered for tomorrow AM.Findings and plan of care discussed with supervising physician Dr. Erma Heritage who personally evaluated and examined this patient and is in agreement with evaluation and plan of care.   00:30: Patient signed out to Sharilyn Sites PA-C at change of shift with plan as above.    Final Clinical Impressions(s) / ED Diagnoses   Final diagnoses:  None    ED Discharge Orders    None       Cherly Anderson, PA-C 01/22/18 0052    Shaune Pollack, MD 01/22/18 [phone]

## 2018-01-21 NOTE — Progress Notes (Signed)
VASCULAR LAB PRELIMINARY  PRELIMINARY  PRELIMINARY  PRELIMINARY  Bilateral lower extremity venous duplex completed.    Preliminary report: There is no obvious evidence of DVT or SVT noted in the visualized veins of the bilateral lower extremities.  Gave report to [facility] Petrucelli, PA-C  Wiktoria Hemrick, RVT 01/21/2018, 8:19 PM    \

## 2018-01-22 ENCOUNTER — Telehealth (HOSPITAL_COMMUNITY): Payer: Self-pay | Admitting: Emergency Medicine

## 2018-01-22 DIAGNOSIS — M79605 Pain in left leg: Principal | ICD-10-CM

## 2018-01-22 DIAGNOSIS — M79604 Pain in right leg: Secondary | ICD-10-CM

## 2018-01-22 MED ORDER — TRAMADOL HCL 50 MG PO TABS: 50.0000 mg | ORAL_TABLET | Freq: Four times a day (QID) | ORAL | 0 refills | Status: AC | PRN

## 2018-01-22 MED ORDER — TRAMADOL HCL 50 MG PO TABS: 50.0000 mg | ORAL_TABLET | Freq: Four times a day (QID) | ORAL | 0 refills | Status: DC | PRN

## 2018-01-22 MED ORDER — POTASSIUM CHLORIDE ER 10 MEQ PO TBCR
10.0000 meq | EXTENDED_RELEASE_TABLET | Freq: Every day | ORAL | Status: DC
  Administered 2018-01-22: 10 meq via ORAL
  Filled 2018-01-22 (×2): qty 1

## 2018-01-22 MED ORDER — FUROSEMIDE 40 MG PO TABS
40.0000 mg | ORAL_TABLET | Freq: Every day | ORAL | Status: DC
  Administered 2018-01-22: 40 mg via ORAL
  Filled 2018-01-22: qty 1

## 2018-01-22 MED ORDER — FUROSEMIDE 40 MG PO TABS: 40.0000 mg | ORAL_TABLET | Freq: Every day | ORAL | Status: DC

## 2018-01-22 MED ORDER — LISINOPRIL 5 MG PO TABS: 5.0000 mg | ORAL_TABLET | Freq: Every day | ORAL | Status: DC

## 2018-01-22 MED ORDER — VITAMIN B-12 100 MCG PO TABS: 100.0000 ug | ORAL_TABLET | Freq: Every day | ORAL | Status: DC

## 2018-01-22 MED ORDER — VITAMIN B-12 100 MCG PO TABS
100.0000 ug | ORAL_TABLET | Freq: Every day | ORAL | Status: DC
  Administered 2018-01-22: 100 ug via ORAL
  Filled 2018-01-22: qty 1

## 2018-01-22 MED ORDER — CLOPIDOGREL BISULFATE 75 MG PO TABS
75.0000 mg | ORAL_TABLET | Freq: Every day | ORAL | Status: DC
  Administered 2018-01-22: 75 mg via ORAL
  Filled 2018-01-22: qty 1

## 2018-01-22 MED ORDER — SIMVASTATIN 20 MG PO TABS
20.0000 mg | ORAL_TABLET | Freq: Every day | ORAL | Status: DC
  Administered 2018-01-22: 20 mg via ORAL
  Filled 2018-01-22: qty 1

## 2018-01-22 MED ORDER — CLOPIDOGREL BISULFATE 75 MG PO TABS: 75.0000 mg | ORAL_TABLET | Freq: Every day | ORAL | Status: DC

## 2018-01-22 MED ORDER — SIMVASTATIN 20 MG PO TABS: 20.0000 mg | ORAL_TABLET | Freq: Every day | ORAL | Status: DC

## 2018-01-22 MED ORDER — ACETAMINOPHEN 325 MG PO TABS
650.0000 mg | ORAL_TABLET | Freq: Four times a day (QID) | ORAL | Status: DC | PRN
Start: 2018-01-22 — End: 2018-01-22
  Administered 2018-01-22: 650 mg via ORAL
  Filled 2018-01-22: qty 2

## 2018-01-22 MED ORDER — LISINOPRIL 5 MG PO TABS
5.0000 mg | ORAL_TABLET | Freq: Every day | ORAL | Status: DC
  Administered 2018-01-22: 5 mg via ORAL
  Filled 2018-01-22: qty 1

## 2018-01-22 MED ORDER — POTASSIUM CHLORIDE ER 10 MEQ PO TBCR: 10.0000 meq | EXTENDED_RELEASE_TABLET | Freq: Every day | ORAL | Status: DC

## 2018-01-22 NOTE — Progress Notes (Signed)
CS[facility] [place] with [facility]. [name] stated that pt arrived with no AVS, prescriptions, and no report was called.   CSW updated charge nurse about the issues with no nurse calling report and prescriptions.   EDP will provided needed prescriptions for the nursing home.   Montine CircleKelsy Shantel Helwig, Silverio LayLCSWA [facility]  [phone]

## 2018-01-22 NOTE — ED Provider Notes (Signed)
  6:20 AM Patient monitored overnight, no acute events.  Has been resting comfortably.  Will sign out to morning team for continued monitoring until placement this AM.   Garlon HatchetSanders, Alson Mcpheeters M, PA-C 01/22/18 [phone]    Geoffery Lyonselo, Douglas, MD 01/22/18 [phone]

## 2018-01-22 NOTE — ED Notes (Addendum)
Triage disconnected the monitor for this patient too quickly for me to validate VS. I do remember VS were stable.

## 2018-01-22 NOTE — [place] FL2 (Addendum)
[place] MEDICAID FL2 LEVEL OF CARE SCREENING TOOL     IDENTIFICATION  Patient Name: Mark Bullock Birthdate: 1929-03-18 Sex: male Admission Date (Current Location): 01/21/2018  [facility] [place] Number:  Producer, television/film/video and Address:  [facility],  [address], [place] [address]      Provider Number: [id number]  Attending Physician Name and Address:  Default, Provider, MD  Relative Name and Phone Number:       Current Level of Care: Hospital Recommended Level of Care: Skilled Nursing Facility Prior Approval Number:    Date Approved/Denied:   PASRR Number:  [id number]  Discharge Plan: SNF    Current Diagnoses: Patient Active Problem List   Diagnosis Date Noted  . Chronic cerebrovascular accident (CVA) 07/07/2017  . Essential hypertension, benign 05/24/2017  . Type II diabetes mellitus with stage 3 chronic kidney disease (HCC) 05/24/2017  . Dyslipidemia associated with type 2 diabetes mellitus (HCC) 05/24/2017  . Vascular dementia without behavioral disturbance 05/24/2017  . Vitamin B 12 deficiency 05/24/2017  . Rhabdomyolysis 05/19/2017  . AKI (acute kidney injury) (HCC) 05/18/2017  . Elevated troponin 05/18/2017  . History of completed stroke 05/18/2017  . CKD (chronic kidney disease), stage III (HCC) 05/18/2017  . Physical deconditioning 05/08/2017  . Osteoarthritis 05/08/2017  . Bilateral lower extremity edema   . Abnormal EKG   . Elevated brain natriuretic peptide (BNP) level     Orientation RESPIRATION BLADDER Height & Weight     Self, Time, Situation, Place  Normal Continent Weight:   Height:     BEHAVIORAL SYMPTOMS/MOOD NEUROLOGICAL BOWEL NUTRITION STATUS      Continent Diet  AMBULATORY STATUS COMMUNICATION OF NEEDS Skin   Limited Assist Verbally Other (Comment)(Bilateral leg edema)                       Personal Care Assistance Level of Assistance  Bathing, Feeding, Dressing Bathing Assistance: Limited  assistance Feeding assistance: Independent Dressing Assistance: Limited assistance     Functional Limitations Info             SPECIAL CARE FACTORS FREQUENCY  PT (By licensed PT), OT (By licensed OT)     PT Frequency: 5 OT Frequency: 5            Contractures      Additional Factors Info  Code Status Code Status Info: Prior             Current Medications (01/22/2018):  This is the current hospital active medication list Current Facility-Administered Medications  Medication Dose Route Frequency Provider Last Rate Last Dose  . acetaminophen (TYLENOL) tablet 650 mg  650 mg Oral Q6H PRN Petrucelli, Samantha R, PA-C   650 mg at 01/22/18 0813  . clopidogrel (PLAVIX) tablet 75 mg  75 mg Oral Daily Petrucelli, Samantha R, PA-C   75 mg at 01/22/18 6962  . furosemide (LASIX) tablet 40 mg  40 mg Oral Daily Petrucelli, Samantha R, PA-C   40 mg at 01/22/18 9528  . lisinopril (PRINIVIL,ZESTRIL) tablet 5 mg  5 mg Oral Daily Petrucelli, Samantha R, PA-C   5 mg at 01/22/18 4132  . potassium chloride (K-DUR) CR tablet 10 mEq  10 mEq Oral Daily Petrucelli, Samantha R, PA-C   10 mEq at 01/22/18 4401  . simvastatin (ZOCOR) tablet 20 mg  20 mg Oral Daily Petrucelli, Samantha R, PA-C   20 mg at 01/22/18 1059  . vitamin B-12 (CYANOCOBALAMIN) tablet 100 mcg  100 mcg Oral Daily Petrucelli, Samantha R, PA-C   100 mcg at 01/22/18 [id number]   Current Outpatient Medications  Medication Sig Dispense Refill  . acetaminophen (TYLENOL) 325 MG tablet Take 650 mg every 6 (six) hours as needed by mouth for mild pain or moderate pain.    Marland Kitchen. clopidogrel (PLAVIX) 75 MG tablet Take 75 mg by mouth daily.  4  . docusate sodium (COLACE) 100 MG capsule Take 100 mg by mouth daily.     . furosemide (LASIX) 40 MG tablet Take 1 tablet (40 mg total) by mouth daily. 30 tablet 4  . guaiFENesin (MUCINEX) 600 MG 12 hr tablet Take 1 tablet (600 mg total) 2 (two) times daily by mouth.    Marland Kitchen. lisinopril (PRINIVIL,ZESTRIL) 5 MG  tablet Take 5 mg by mouth daily.  4  . potassium chloride (K-DUR) 10 MEQ tablet TAKE 1 TABLET BY MOUTH DAILY WITH LASIX  4  . simvastatin (ZOCOR) 20 MG tablet Take 20 mg by mouth daily.  4  . traMADol (ULTRAM) 50 MG tablet Take 1 tablet (50 mg total) by mouth every 6 (six) hours as needed. (Patient taking differently: Take 50 mg by mouth every 6 (six) hours as needed for moderate pain. ) 15 tablet 0  . vitamin B-12 (CYANOCOBALAMIN) 100 MCG tablet Take 100 mcg by mouth daily.       Discharge Medications: Please see discharge summary for a list of discharge medications.  Relevant Imaging Results:  Relevant Lab Results:   Additional Information [id number]  Archie BalboaMackenzie  Irwin, LCSW

## 2018-01-22 NOTE — Progress Notes (Addendum)
12:45pm- CSW spoke with daughter, [name], regarding disposition plan. [name] stated that she is able to pick up patient at 2pm and [facility] and has a bed- 1204P. CSW has notified RN and EDP who are agreeable to plan. CSW awaiting AVS summary and will fax to facility once completed.  Please call report to [phone].      11:51am- CSW attempted to reach out to daughter, Dr. Braulio BoschAnita Stewart at [phone], regarding patient's disposition plan. CSW left voicemail and is currently awaiting a return call.  10:46am- [facility] [place] who states that they are accepting patient but she is awaiting a return call from daughter [name] who is in a meeting until [phone] and will update CSW after she has spoken to daughter.  CSW spoke with Everardo Pacific[facility]isha of [facility] at [phone] who states that she is not aware of patient but that she would follow back up with CSW. [facility] CSW awaiting return call.  Archie BalboaMackenzie Irwin, LCSWA  Clinical Social Work Department  [facility]  [phone]

## 2018-01-22 NOTE — Discharge Instructions (Signed)
1.  [facility]

## 2018-01-22 NOTE — Telephone Encounter (Signed)
Notified by case management patient needs new prescription.  I provided new prescription for tramadol which be taken to his nursing facility.

## 2018-01-22 NOTE — NC FL2 (Signed)
[place] MEDICAID FL2 LEVEL OF CARE SCREENING TOOL     IDENTIFICATION  Patient Name: Mark Bullock Birthdate: [birth year] Sex: male Admission Date (Current Location): 01/21/2018  [facility] and [place] Number:  Producer, television/film/video and Address:  [facility],  [address] [place] [address]      Provider Number: [id number]  Attending Physician Name and Address:  Default, Provider, MD  Relative Name and Phone Number:       Current Level of Care: Hospital Recommended Level of Care: Skilled Nursing Facility Prior Approval Number:    Date Approved/Denied:   PASRR Number:    Discharge Plan: SNF    Current Diagnoses: Patient Active Problem List   Diagnosis Date Noted  . Chronic cerebrovascular accident (CVA) 07/07/2017  . Essential hypertension, benign 05/24/2017  . Type II diabetes mellitus with stage 3 chronic kidney disease (HCC) 05/24/2017  . Dyslipidemia associated with type 2 diabetes mellitus (HCC) 05/24/2017  . Vascular dementia without behavioral disturbance 05/24/2017  . Vitamin B 12 deficiency 05/24/2017  . Rhabdomyolysis 05/19/2017  . AKI (acute kidney injury) (HCC) 05/18/2017  . Elevated troponin 05/18/2017  . History of completed stroke 05/18/2017  . CKD (chronic kidney disease), stage III (HCC) 05/18/2017  . Physical deconditioning 05/08/2017  . Osteoarthritis 05/08/2017  . Bilateral lower extremity edema   . Abnormal EKG   . Elevated brain natriuretic peptide (BNP) level     Orientation RESPIRATION BLADDER Height & Weight     Self, Time, Situation, Place  Normal Continent Weight:   Height:     BEHAVIORAL SYMPTOMS/MOOD NEUROLOGICAL BOWEL NUTRITION STATUS      Continent Diet- Regular  AMBULATORY STATUS COMMUNICATION OF NEEDS Skin   Limited Assist Verbally Other (Comment)(Bilateral leg edema)                       Personal Care Assistance Level of Assistance  Bathing, Feeding, Dressing Bathing Assistance: Limited  assistance Feeding assistance: Independent Dressing Assistance: Limited assistance     Functional Limitations Info             SPECIAL CARE FACTORS FREQUENCY  PT (By licensed PT), OT (By licensed OT)     PT Frequency: 5 OT Frequency: 5            Contractures      Additional Factors Info  Code Status Code Status Info: Prior             Current Medications (01/22/2018):  This is the current hospital active medication list Current Facility-Administered Medications  Medication Dose Route Frequency Provider Last Rate Last Dose  . acetaminophen (TYLENOL) tablet 650 mg  650 mg Oral Q6H PRN Petrucelli, Samantha R, PA-C   650 mg at 01/22/18 0813  . clopidogrel (PLAVIX) tablet 75 mg  75 mg Oral Daily Petrucelli, Samantha R, PA-C   75 mg at 01/22/18 1027  . furosemide (LASIX) tablet 40 mg  40 mg Oral Daily Petrucelli, Samantha R, PA-C   40 mg at 01/22/18 2536  . lisinopril (PRINIVIL,ZESTRIL) tablet 5 mg  5 mg Oral Daily Petrucelli, Samantha R, PA-C   5 mg at 01/22/18 6440  . potassium chloride (K-DUR) CR tablet 10 mEq  10 mEq Oral Daily Petrucelli, Samantha R, PA-C   10 mEq at 01/22/18 3474  . simvastatin (ZOCOR) tablet 20 mg  20 mg Oral Daily Petrucelli, Samantha R, PA-C      . vitamin B-12 (CYANOCOBALAMIN) tablet 100 mcg  100 mcg  Oral Daily Petrucelli, Samantha R, PA-C   100 mcg at 01/22/18 [id number]   Current Outpatient Medications  Medication Sig Dispense Refill  . acetaminophen (TYLENOL) 325 MG tablet Take 650 mg every 6 (six) hours as needed by mouth for mild pain or moderate pain.    Marland Kitchen. clopidogrel (PLAVIX) 75 MG tablet Take 75 mg by mouth daily.  4  . docusate sodium (COLACE) 100 MG capsule Take 100 mg by mouth daily.     . furosemide (LASIX) 40 MG tablet Take 1 tablet (40 mg total) by mouth daily. 30 tablet 4  . guaiFENesin (MUCINEX) 600 MG 12 hr tablet Take 1 tablet (600 mg total) 2 (two) times daily by mouth.    Marland Kitchen. lisinopril (PRINIVIL,ZESTRIL) 5 MG tablet Take 5 mg by  mouth daily.  4  . potassium chloride (K-DUR) 10 MEQ tablet TAKE 1 TABLET BY MOUTH DAILY WITH LASIX  4  . simvastatin (ZOCOR) 20 MG tablet Take 20 mg by mouth daily.  4  . traMADol (ULTRAM) 50 MG tablet Take 1 tablet (50 mg total) by mouth every 6 (six) hours as needed. (Patient taking differently: Take 50 mg by mouth every 6 (six) hours as needed for moderate pain. ) 15 tablet 0  . vitamin B-12 (CYANOCOBALAMIN) 100 MCG tablet Take 100 mcg by mouth daily.       Discharge Medications: Please see discharge summary for a list of discharge medications.  Relevant Imaging Results:  Relevant Lab Results:   Additional Information [id number]  Archie BalboaMackenzie  [place], LCSW

## 2018-01-22 NOTE — ED Provider Notes (Signed)
Patient has been accepted at [facility] for nursing home care.  Anticipated transport within the next 30 minutes.  Patient was reassessed by myself for stability prior to discharge.  Patient is alert and cheerful.  He is pleasantly interactive without acute distress. Heart regular. Lungs occasional rhonchi, good airflow no respiratory distress. Abdomen soft nontender. Bilateral lower extremities with large peripheral edema and changes of chronic edema consistent with lymphedema.  No erythema or infected appearing wounds.  I reviewed EMR and patient's diagnostic studies including bilateral negative DVT study.  Patient is stable for discharge to nursing home placement.   Arby BarrettePfeiffer, Victoriana Aziz, MD 01/22/18 1353

## 2018-01-23 LAB — URINE CULTURE

## 2018-01-23 NOTE — Telephone Encounter (Signed)
Tramadol Rx provided to CSW to provide to pt.

## 2018-09-26 ENCOUNTER — Other Ambulatory Visit: Payer: Self-pay | Admitting: Urology

## 2018-09-26 DIAGNOSIS — R972 Elevated prostate specific antigen [PSA]: Secondary | ICD-10-CM

## 2018-09-30 IMAGING — DX DG CHEST 2V
2 series · 2 of 2 positions shown · non-contrast
Comparison: July 23, 2014 and January 15, 2010.

CLINICAL DATA: Diaphoresis

EXAM:
CHEST  2 VIEW

[x chest ap]
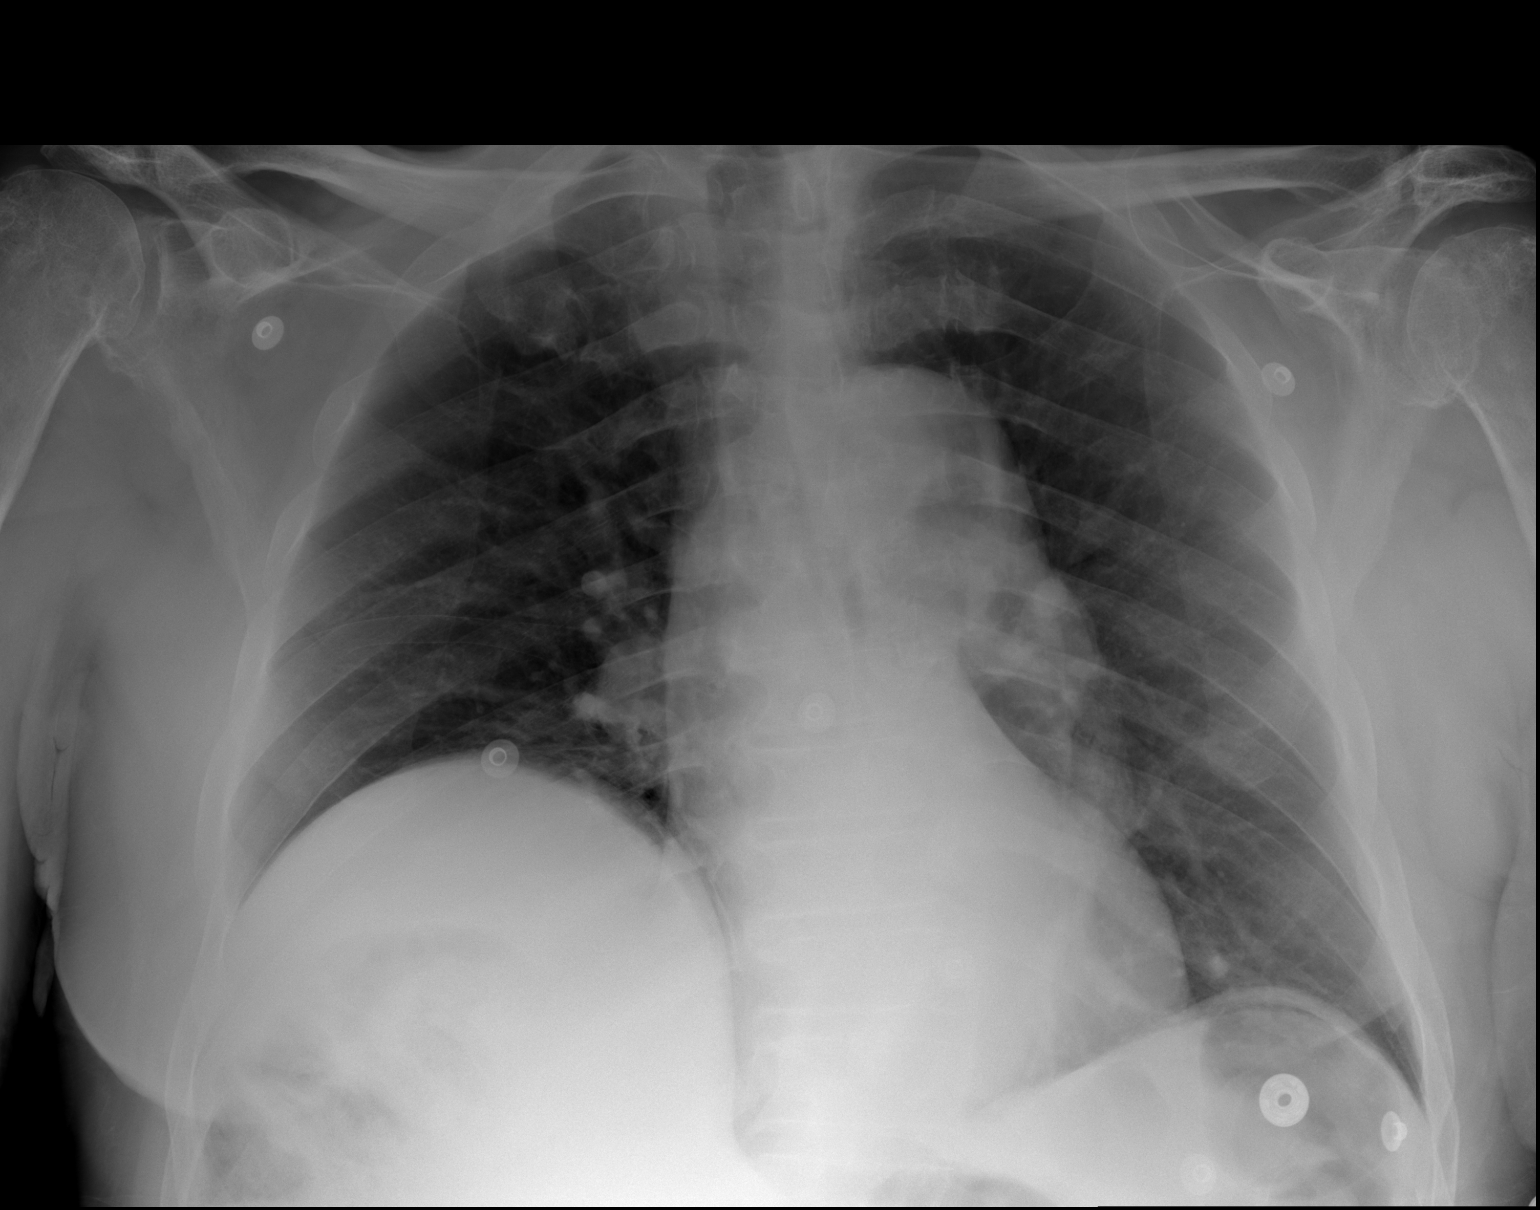

[w chest lat]
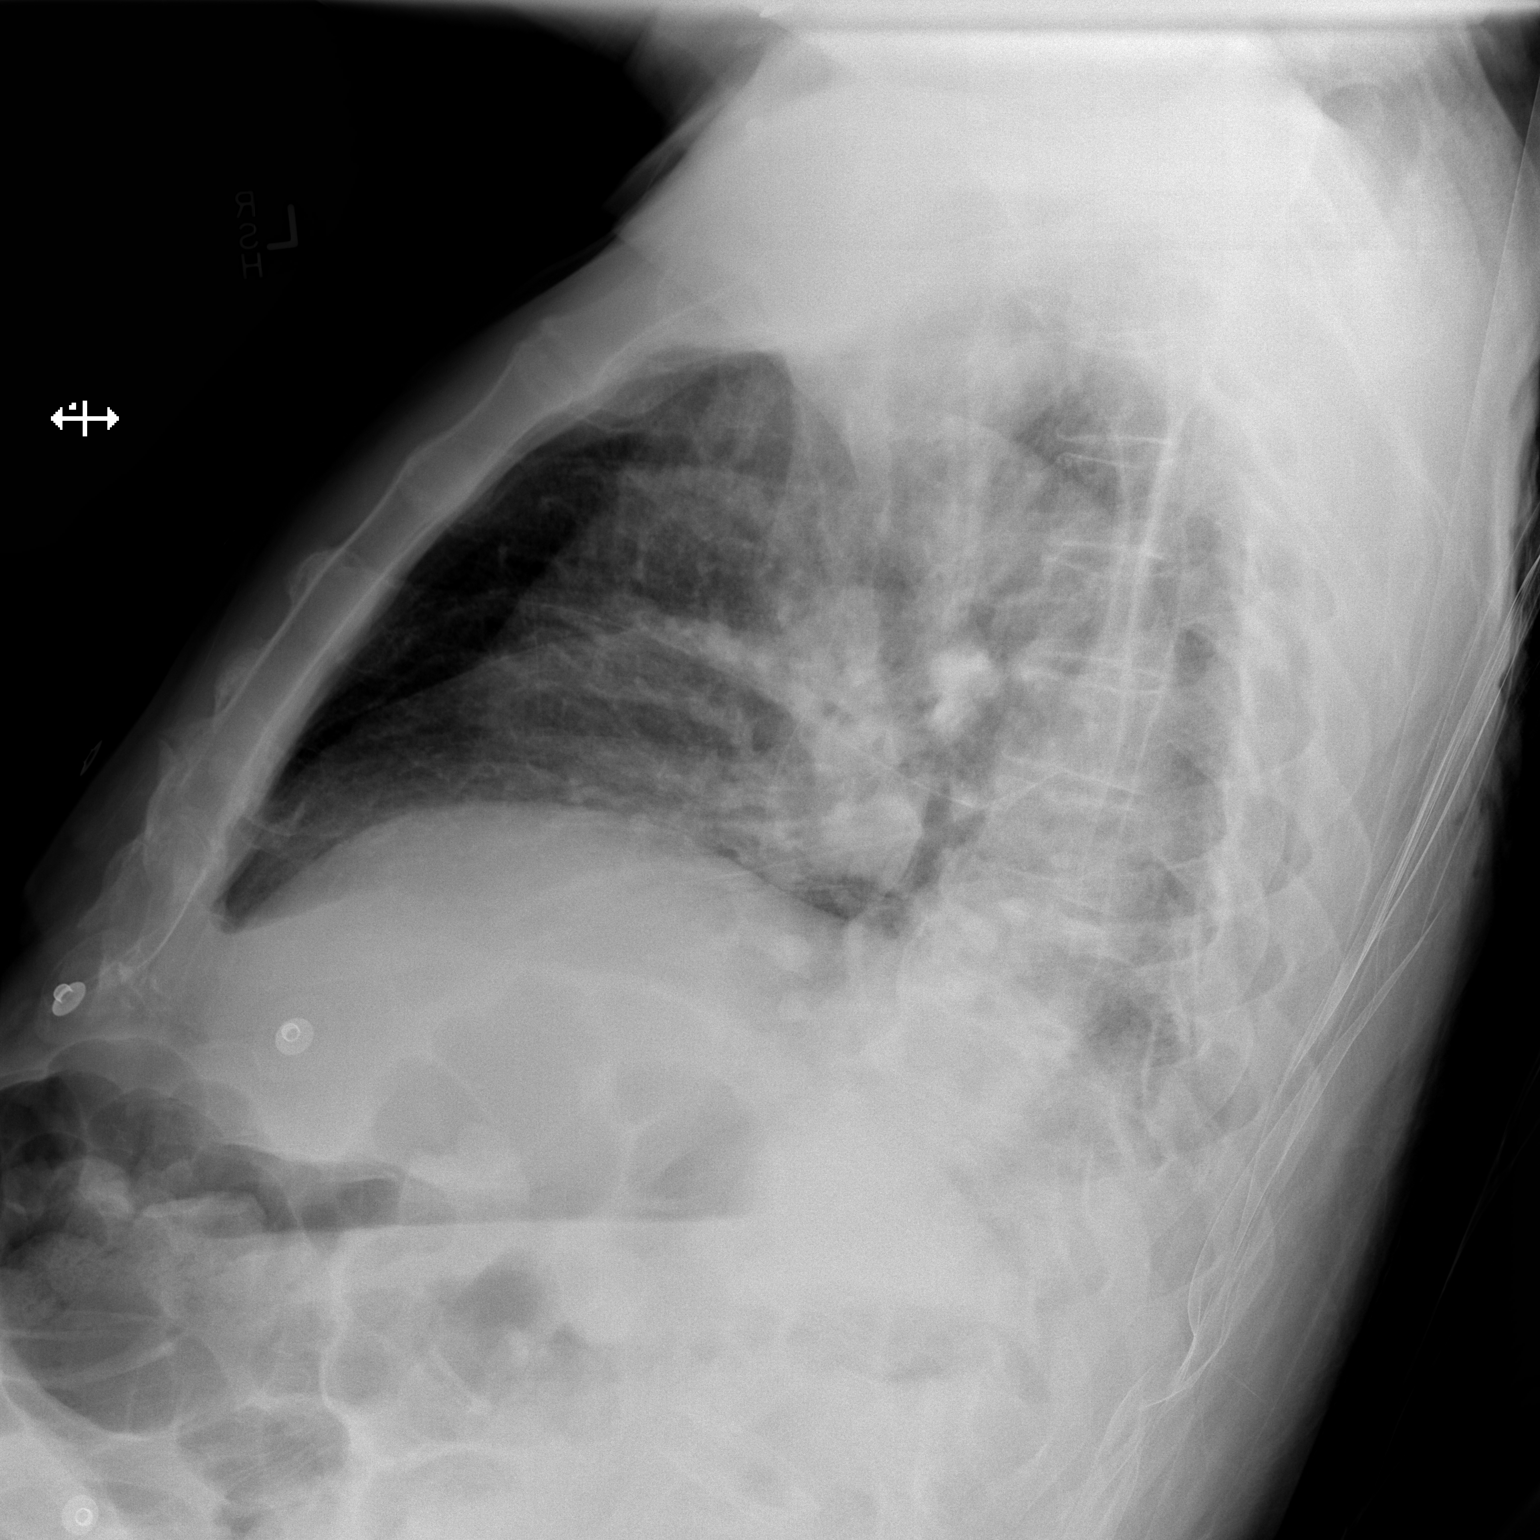

[2 of 2 positions shown; findings below may reference images not displayed]

FINDINGS: There is chronic elevation of the left hemidiaphragm. There is no
appreciable edema or consolidation. Heart size and pulmonary
vascularity are normal. No adenopathy. There is degenerative change
in each shoulder.
IMPRESSION: Elevation of right hemidiaphragm.  No edema or consolidation.

## 2018-10-09 IMAGING — DX DG CHEST 2V
2 series · 2 of 2 positions shown · non-contrast
Comparison: 05/08/2017

CLINICAL DATA: Chronic left-sided weakness. Unable to walk today.
Patient was seen yesterday for leg swelling. Bilateral leg pain.
History of hypertension and diabetes.

EXAM:
CHEST  2 VIEW

[w chest lat]
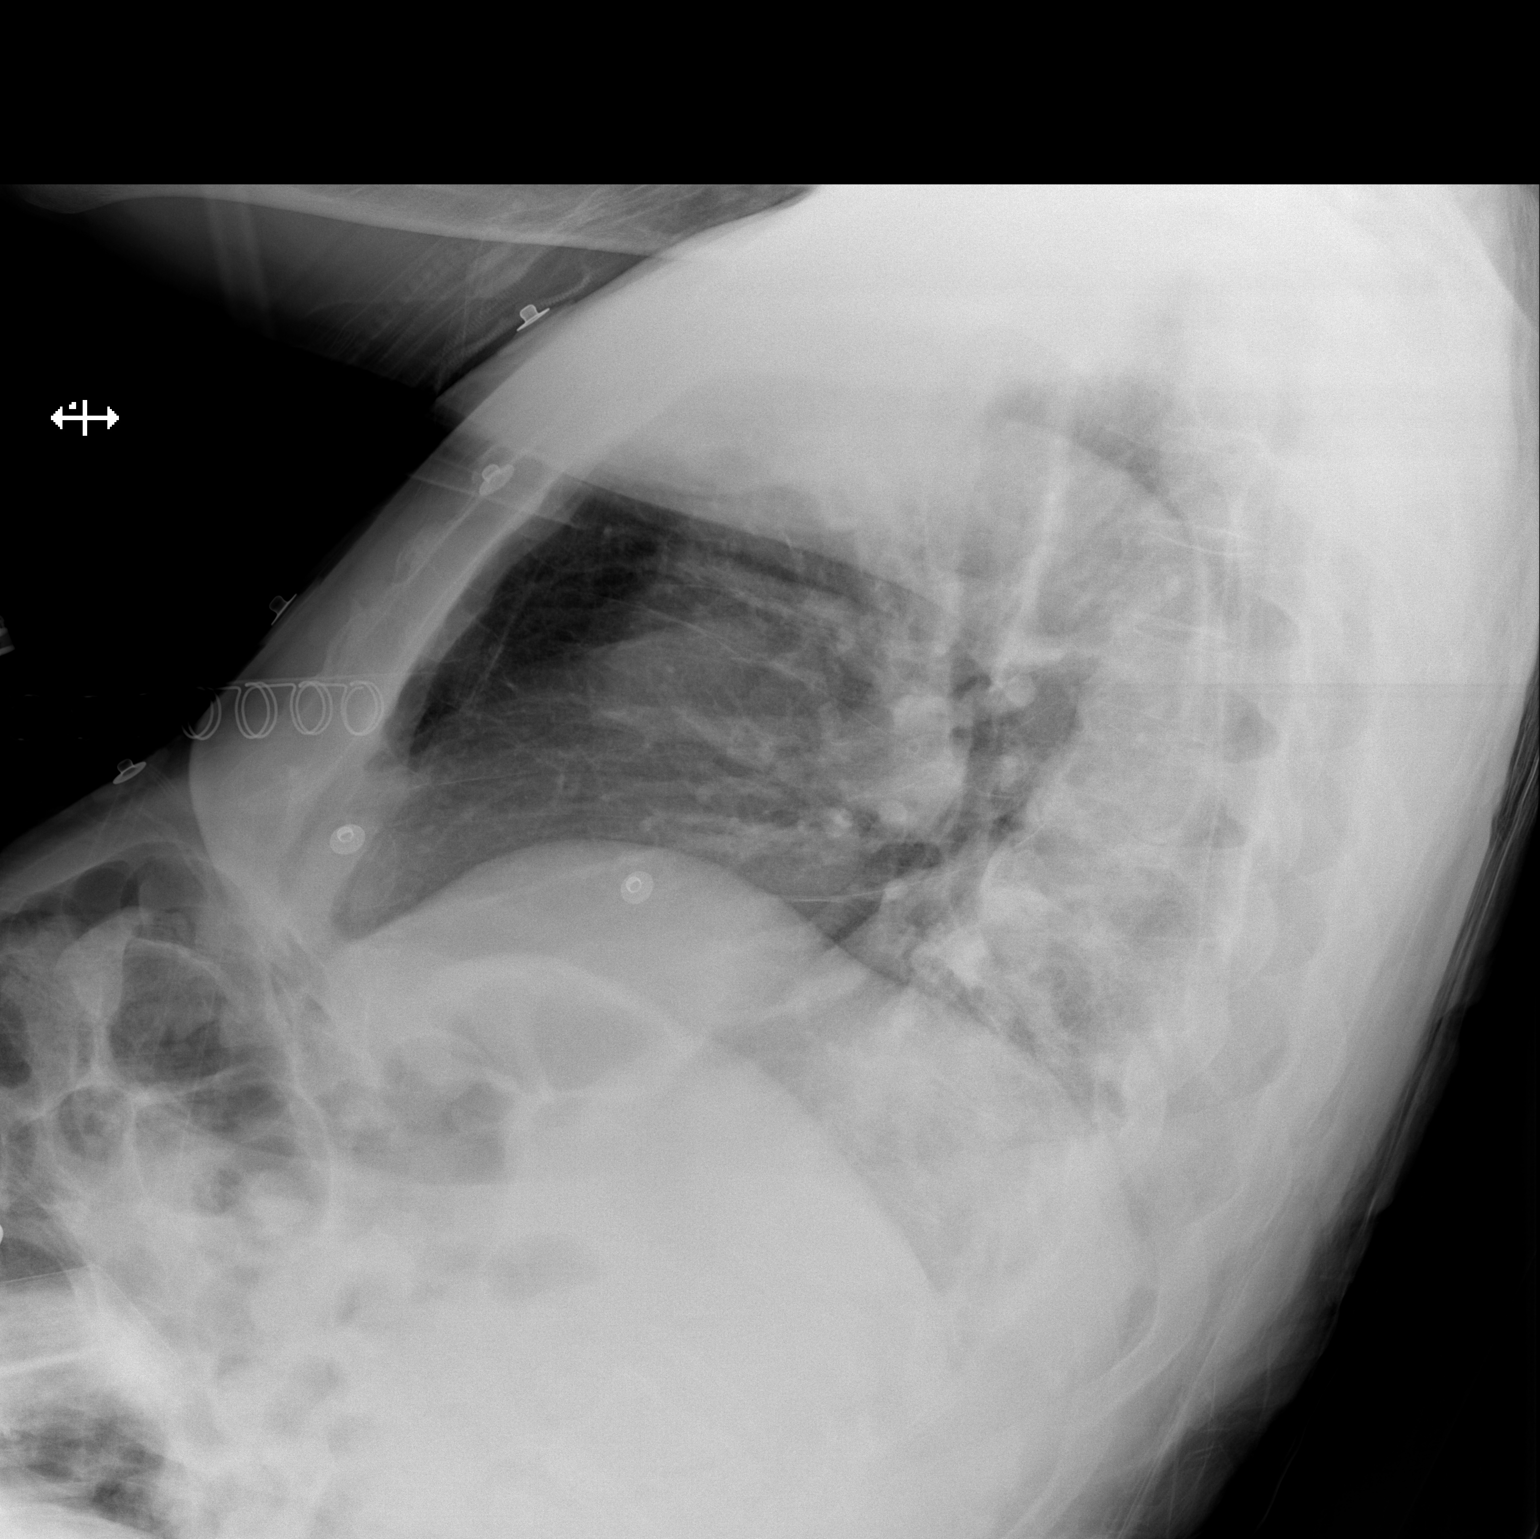

[x chest ap]
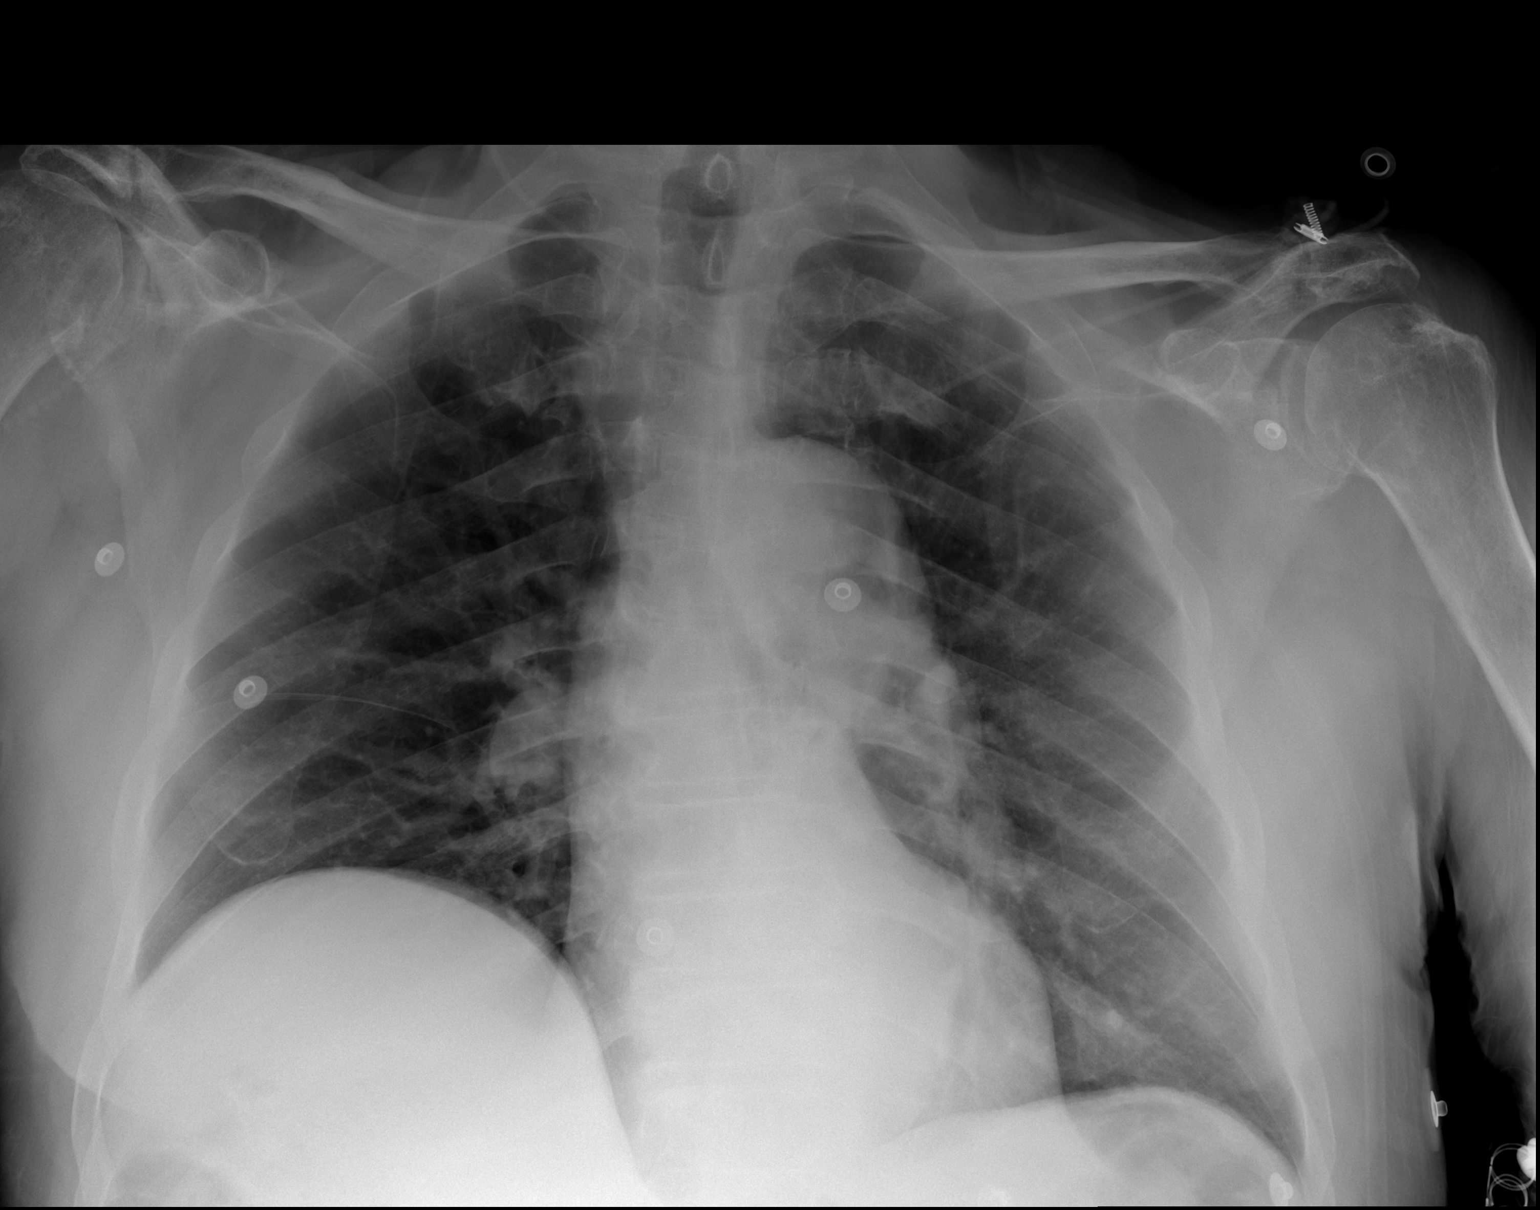

[2 of 2 positions shown; findings below may reference images not displayed]

FINDINGS: Shallow inspiration with elevation of the right hemidiaphragm. Heart
size and pulmonary vascularity are normal. No airspace disease or
consolidation in the lungs. Calcified granuloma in the left lung
base with calcified left hilar lymph node. Tortuous aorta.
Degenerative changes in the spine and shoulders.
IMPRESSION: Elevation of right hemidiaphragm. No evidence of active pulmonary
disease.
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
## 2018-10-21 ENCOUNTER — Telehealth (HOSPITAL_COMMUNITY): Payer: Self-pay

## 2018-10-25 ENCOUNTER — Encounter (HOSPITAL_COMMUNITY)
Admission: RE | Admit: 2018-10-25 | Discharge: 2018-10-25 | Disposition: A | Discharge status: Home / Self Care | Payer: Medicare Other | Source: Ambulatory Visit | Attending: Urology | Admitting: Urology

## 2018-10-25 ENCOUNTER — Other Ambulatory Visit: Payer: Self-pay

## 2018-10-25 DIAGNOSIS — R972 Elevated prostate specific antigen [PSA]: Secondary | ICD-10-CM | POA: Insufficient documentation

## 2018-10-25 MED ORDER — TECHNETIUM TC 99M MEDRONATE IV KIT
19.7000 | PACK | Freq: Once | INTRAVENOUS | Status: AC | PRN
  Administered 2018-10-25: 19.7 via INTRAVENOUS

## 2018-10-30 ENCOUNTER — Encounter (HOSPITAL_COMMUNITY): Payer: Medicare Other

## 2019-03-28 ENCOUNTER — Inpatient Hospital Stay (HOSPITAL_COMMUNITY)
Admission: EM | Admit: 2019-03-28 | Discharge: 2019-04-01 | DRG: 602 | Disposition: A | Discharge status: Skilled Nursing Facility | Payer: Medicare Other | Source: Skilled Nursing Facility | Attending: Internal Medicine | Admitting: Internal Medicine

## 2019-03-28 ENCOUNTER — Other Ambulatory Visit: Payer: Self-pay

## 2019-03-28 ENCOUNTER — Emergency Department (HOSPITAL_COMMUNITY): Payer: Medicare Other

## 2019-03-28 DIAGNOSIS — I10 Essential (primary) hypertension: Secondary | ICD-10-CM | POA: Diagnosis present

## 2019-03-28 DIAGNOSIS — E1169 Type 2 diabetes mellitus with other specified complication: Secondary | ICD-10-CM | POA: Diagnosis present

## 2019-03-28 DIAGNOSIS — N400 Enlarged prostate without lower urinary tract symptoms: Secondary | ICD-10-CM | POA: Diagnosis present

## 2019-03-28 DIAGNOSIS — I2693 Single subsegmental pulmonary embolism without acute cor pulmonale: Secondary | ICD-10-CM | POA: Diagnosis present

## 2019-03-28 DIAGNOSIS — Z7902 Long term (current) use of antithrombotics/antiplatelets: Secondary | ICD-10-CM

## 2019-03-28 DIAGNOSIS — I2699 Other pulmonary embolism without acute cor pulmonale: Secondary | ICD-10-CM | POA: Diagnosis present

## 2019-03-28 DIAGNOSIS — I69354 Hemiplegia and hemiparesis following cerebral infarction affecting left non-dominant side: Secondary | ICD-10-CM

## 2019-03-28 DIAGNOSIS — Z8673 Personal history of transient ischemic attack (TIA), and cerebral infarction without residual deficits: Secondary | ICD-10-CM

## 2019-03-28 DIAGNOSIS — F0150 Vascular dementia without behavioral disturbance: Secondary | ICD-10-CM | POA: Diagnosis present

## 2019-03-28 DIAGNOSIS — E861 Hypovolemia: Secondary | ICD-10-CM | POA: Diagnosis not present

## 2019-03-28 DIAGNOSIS — R600 Localized edema: Secondary | ICD-10-CM | POA: Diagnosis present

## 2019-03-28 DIAGNOSIS — I6930 Unspecified sequelae of cerebral infarction: Secondary | ICD-10-CM

## 2019-03-28 DIAGNOSIS — I440 Atrioventricular block, first degree: Secondary | ICD-10-CM | POA: Diagnosis present

## 2019-03-28 DIAGNOSIS — I4510 Unspecified right bundle-branch block: Secondary | ICD-10-CM | POA: Diagnosis present

## 2019-03-28 DIAGNOSIS — L03116 Cellulitis of left lower limb: Principal | ICD-10-CM | POA: Diagnosis present

## 2019-03-28 DIAGNOSIS — I5022 Chronic systolic (congestive) heart failure: Secondary | ICD-10-CM | POA: Diagnosis present

## 2019-03-28 DIAGNOSIS — L03115 Cellulitis of right lower limb: Secondary | ICD-10-CM | POA: Diagnosis present

## 2019-03-28 DIAGNOSIS — E785 Hyperlipidemia, unspecified: Secondary | ICD-10-CM | POA: Diagnosis present

## 2019-03-28 DIAGNOSIS — Z9114 Patient's other noncompliance with medication regimen: Secondary | ICD-10-CM

## 2019-03-28 DIAGNOSIS — N183 Chronic kidney disease, stage 3 unspecified: Secondary | ICD-10-CM | POA: Diagnosis present

## 2019-03-28 DIAGNOSIS — I82402 Acute embolism and thrombosis of unspecified deep veins of left lower extremity: Secondary | ICD-10-CM | POA: Diagnosis present

## 2019-03-28 DIAGNOSIS — I82412 Acute embolism and thrombosis of left femoral vein: Secondary | ICD-10-CM | POA: Diagnosis present

## 2019-03-28 DIAGNOSIS — E538 Deficiency of other specified B group vitamins: Secondary | ICD-10-CM | POA: Diagnosis present

## 2019-03-28 DIAGNOSIS — E876 Hypokalemia: Secondary | ICD-10-CM | POA: Diagnosis present

## 2019-03-28 DIAGNOSIS — Z79899 Other long term (current) drug therapy: Secondary | ICD-10-CM

## 2019-03-28 DIAGNOSIS — I130 Hypertensive heart and chronic kidney disease with heart failure and stage 1 through stage 4 chronic kidney disease, or unspecified chronic kidney disease: Secondary | ICD-10-CM | POA: Diagnosis present

## 2019-03-28 DIAGNOSIS — Z20828 Contact with and (suspected) exposure to other viral communicable diseases: Secondary | ICD-10-CM | POA: Diagnosis present

## 2019-03-28 DIAGNOSIS — R609 Edema, unspecified: Secondary | ICD-10-CM

## 2019-03-28 DIAGNOSIS — E1122 Type 2 diabetes mellitus with diabetic chronic kidney disease: Secondary | ICD-10-CM | POA: Diagnosis present

## 2019-03-28 LAB — CBC WITH DIFFERENTIAL/PLATELET
Abs Immature Granulocytes: 0.02 10*3/uL (ref 0.00–0.07)
Basophils Absolute: 0 10*3/uL (ref 0.0–0.1)
Basophils Relative: 0 %
Eosinophils Absolute: 0.1 10*3/uL (ref 0.0–0.5)
Eosinophils Relative: 2 %
HCT: 36.2 % — ABNORMAL LOW (ref 39.0–52.0)
Hemoglobin: 11.4 g/dL — ABNORMAL LOW (ref 13.0–17.0)
Immature Granulocytes: 0 %
Lymphocytes Relative: 19 %
Lymphs Abs: 1.5 10*3/uL (ref 0.7–4.0)
MCH: 28.1 pg (ref 26.0–34.0)
MCHC: 31.5 g/dL (ref 30.0–36.0)
MCV: 89.4 fL (ref 80.0–100.0)
Monocytes Absolute: 0.7 10*3/uL (ref 0.1–1.0)
Monocytes Relative: 9 %
Neutro Abs: 5.5 10*3/uL (ref 1.7–7.7)
Neutrophils Relative %: 70 %
Platelets: 244 10*3/uL (ref 150–400)
RBC: 4.05 MIL/uL — ABNORMAL LOW (ref 4.22–5.81)
RDW: 15.1 % (ref 11.5–15.5)
WBC: 7.9 10*3/uL (ref 4.0–10.5)
nRBC: 0 % (ref 0.0–0.2)

## 2019-03-28 LAB — APTT: aPTT: 35 seconds (ref 24–36)

## 2019-03-28 LAB — PROTIME-INR
INR: 1.1 (ref 0.8–1.2)
Prothrombin Time: 14.3 seconds (ref 11.4–15.2)

## 2019-03-28 MED ORDER — SODIUM CHLORIDE 0.9 % IV SOLN
2.0000 g | INTRAVENOUS | Status: DC
  Administered 2019-03-29: 2 g via INTRAVENOUS
  Filled 2019-03-28: qty 20

## 2019-03-28 NOTE — ED Provider Notes (Signed)
[facility] DEPT Provider Note   CSN: [id number] Arrival date & time: 03/28/19  4098     History   Chief Complaint Chief Complaint  Patient presents with  . Leg Swelling    HPI DEVARIUS NELLES is a 85 y.o. male with a hx of kidney injury, diabetes, hyperlipidemia, hypertension, stroke, vascular dementia presents to the Emergency Department complaining of gradual, persistent, progressively worsening bilateral leg swelling left greater than right with an unknown onset.  Patient reports it is just been "getting worse over time."  Facility reports to EMS the patient has been noncompliant with his Lasix.  Level 5 caveat for dementia.     The history is provided by the patient and medical records. No language interpreter was used.    Past Medical History:  Diagnosis Date  . AKI (acute kidney injury) ([place]) 05/18/2017  . Diabetes mellitus without complication ([place])   . Hyperlipidemia   . Hypertension   . Left-sided weakness   . Stroke [facility])     Patient Active Problem List   Diagnosis Date Noted  . Chronic cerebrovascular accident (CVA) 07/07/2017  . Essential hypertension, benign 05/24/2017  . Type II diabetes mellitus with stage 3 chronic kidney disease ([place]) 05/24/2017  . Dyslipidemia associated with type 2 diabetes mellitus ([place]) 05/24/2017  . Vascular dementia without behavioral disturbance ([place]) 05/24/2017  . Vitamin B 12 deficiency 05/24/2017  . Rhabdomyolysis 05/19/2017  . AKI (acute kidney injury) ([place]) 05/18/2017  . Elevated troponin 05/18/2017  . History of completed stroke 05/18/2017  . CKD (chronic kidney disease), stage III ([place]) 05/18/2017  . Physical deconditioning 05/08/2017  . Osteoarthritis 05/08/2017  . Bilateral lower extremity edema   . Abnormal EKG   . Elevated brain natriuretic peptide (BNP) level     Past Surgical History:  Procedure Laterality Date  . APPENDECTOMY    . KNEE SURGERY          Home  Medications    Prior to Admission medications   Medication Sig Start Date End Date Taking? Authorizing Provider  clopidogrel (PLAVIX) 75 MG tablet Take 75 mg by mouth daily. 01/22/15  Yes [provider]  furosemide (LASIX) 40 MG tablet Take 1 tablet (40 mg total) by mouth daily. 01/18/18  Yes Dorie Rank, MD  lisinopril (PRINIVIL,ZESTRIL) 5 MG tablet Take 5 mg by mouth daily. 12/20/17  Yes [provider]  polyethylene glycol (MIRALAX / GLYCOLAX) 17 g packet Take 17 g by mouth daily.   Yes [provider]  potassium chloride (K-DUR) 10 MEQ tablet Take 10 mEq by mouth daily.  12/20/17  Yes [provider]  senna (SENOKOT) 8.6 MG tablet Take 1 tablet by mouth 2 (two) times daily.   Yes [provider]  tamsulosin (FLOMAX) 0.4 MG CAPS capsule Take 0.4 mg by mouth at bedtime.   Yes [provider]  traMADol (ULTRAM) 50 MG tablet Take 1 tablet (50 mg total) by mouth every 6 (six) hours as needed. Patient taking differently: Take 50 mg by mouth every 6 (six) hours as needed for moderate pain or severe pain.  01/22/18  Yes Duffy Bruce, MD  vitamin B-12 (CYANOCOBALAMIN) 100 MCG tablet Take 100 mcg by mouth daily.   Yes [provider]  guaiFENesin (MUCINEX) 600 MG 12 hr tablet Take 1 tablet (600 mg total) 2 (two) times daily by mouth. Patient not taking: Reported on 03/29/2019 05/21/17   Rai, Vernelle Emerald, MD  simvastatin (ZOCOR) 20 MG tablet Take 20  mg by mouth daily. 01/22/15   [provider]    Family History Family History  Family history unknown: Yes    Social History Social History   Tobacco Use  . Smoking status: Never Smoker  . Smokeless tobacco: Never Used  Substance Use Topics  . Alcohol use: No  . Drug use: No     Allergies   Patient has no known allergies.   Review of Systems Review of Systems  Unable to perform ROS: Dementia  Respiratory: Positive for cough and shortness of breath.   Cardiovascular:  Positive for leg swelling.  Musculoskeletal: Positive for arthralgias ( leg pain).     Physical Exam Updated Vital Signs BP 106/65   Pulse 64   Temp 98.3 F (36.8 C) (Oral)   Resp 19   SpO2 100%   Physical Exam Vitals signs and nursing note reviewed.  Constitutional:      General: He is not in acute distress.    Appearance: He is not diaphoretic.  HENT:     Head: Normocephalic.  Eyes:     General: No scleral icterus.    Conjunctiva/sclera: Conjunctivae normal.  Neck:     Musculoskeletal: Normal range of motion.  Cardiovascular:     Rate and Rhythm: Normal rate and regular rhythm.     Pulses: Normal pulses.          Radial pulses are 2+ on the right side and 2+ on the left side.  Pulmonary:     Effort: No tachypnea, accessory muscle usage, prolonged expiration, respiratory distress or retractions.     Breath sounds: No stridor. Examination of the right-lower field reveals rales. Examination of the left-lower field reveals rales. Rales (faint) present.     Comments: Equal chest rise. Abdominal:     General: There is no distension.     Palpations: Abdomen is soft.     Tenderness: There is no abdominal tenderness. There is no guarding or rebound.  Musculoskeletal:     Right lower leg: Edema present.     Left lower leg: Edema present.     Comments: Moves all extremities equally and without difficulty. Significant bilateral lower extremity edema.  Left leg with open and oozing wounds.  Hot to touch.  Skin:    General: Skin is warm and dry.     Capillary Refill: Capillary refill takes less than 2 seconds.  Neurological:     Mental Status: He is alert.     GCS: GCS eye subscore is 4. GCS verbal subscore is 5. GCS motor subscore is 6.     Comments: Speech is clear and goal oriented.  Psychiatric:        Mood and Affect: Mood normal.             ED Treatments / Results  Labs (all labs ordered are listed, but only abnormal results are displayed) Labs Reviewed   CBC WITH DIFFERENTIAL/PLATELET - Abnormal; Notable for the following components:      Result Value   RBC 4.05 (*)    Hemoglobin 11.4 (*)    HCT 36.2 (*)    All other components within normal limits  COMPREHENSIVE METABOLIC PANEL - Abnormal; Notable for the following components:   BUN 24 (*)    Calcium 8.7 (*)    Total Protein 6.2 (*)    Albumin 3.0 (*)    AST 13 (*)    All other components within normal limits  URINALYSIS, ROUTINE W REFLEX MICROSCOPIC - Abnormal;  Notable for the following components:   Leukocytes,Ua SMALL (*)    All other components within normal limits  CULTURE, BLOOD (ROUTINE X 2)  CULTURE, BLOOD (ROUTINE X 2)  URINE CULTURE  SARS CORONAVIRUS 2 (TAT 6-24 HRS)  BRAIN NATRIURETIC PEPTIDE  LACTIC ACID, PLASMA  APTT  PROTIME-INR  TROPONIN I (HIGH SENSITIVITY)    EKG EKG Interpretation  Date/Time:  Friday March 28 2019 23:08:56 EDT Ventricular Rate:  69 PR Interval:  220 QRS Duration: 128 QT Interval:  424 QTC Calculation: 454 R Axis:   -72 Text Interpretation:  Sinus rhythm with sinus arrhythmia with 1st degree A-V block Left axis deviation Right bundle branch block Abnormal ECG No significant change was found Confirmed by Glynn Octaveancour, Stephen [phone]([id number]) on 03/28/2019 11:18:52 PM   Radiology Dg Chest Port 1 View  Result Date: 03/28/2019 CLINICAL DATA:  Edema, rales EXAM: PORTABLE CHEST 1 VIEW COMPARISON:  January 10, 2018 FINDINGS: The heart size and mediastinal contours are unchanged. There is a tortuous descending aorta. Elevation of the right hemidiaphragm. The lungs are clear. No focal airspace consolidation or pleural effusion. No acute osseous abnormality. IMPRESSION: No acute cardiopulmonary process. Electronically Signed   By: Jonna ClarkBindu  Avutu M.D.   On: 03/28/2019 23:57    Procedures Procedures (including critical care time)  Medications Ordered in ED Medications  cefTRIAXone (ROCEPHIN) 2 g in sodium chloride 0.9 % 100 mL IVPB ( Intravenous Stopped  03/29/19 0123)  furosemide (LASIX) injection 20 mg (20 mg Intravenous Given 03/29/19 0227)     Initial Impression / Assessment and Plan / ED Course  I have reviewed the triage vital signs and the nursing notes.  Pertinent labs & imaging results that were available during my care of the patient were reviewed by me and considered in my medical decision making (see chart for details).  Clinical Course as of Mar 28 238  Sat Mar 29, 2019  0211 Discussed with Tresa EndoKelly, med tech at [facility] who reports he was sent due to leg swelling and oozing but this is all the info they are able to provide.  Kenney Housemananya, LPN [phone] (cell) - was on duty at the time, but is unavailable.  Voicemail left requesting a call back.   [HM]  0218 Discussed with Kenney Housemananya - She took a look at his leg this evening at the end of her shift and her staff was instructed to contact the NP about the leg tonight who recommended transfer to the ED.  She reports pt is very reclusive and does not like to have the nursing staff check on him.     [HM]  0227 Kenney Housemananya [facility]. Lasix, Lisinopril, potassium doses missed on Sept - 4, 5, 6, 9, 10, 12, 15   [HM]  0237 Discussed with Dr. Selena BattenKim who will admit   [HM]    Clinical Course User Index [HM] Lanissa Cashen, Boyd KerbsHannah, PA-C       Patient presents with worsening peripheral edema and concerns for cellulitis of the left leg.  [facility].  He has a history of dementia and is unable to provide most of the history.  I attempted to obtain a history from facility however this is very limited.  Per EMS, patient has not been taking his Lasix however [facility] facility does show that he has been taking it daily with the last administration of 03/28/2019.  On exam, pt with significant peripheral edema and  cellulitis.  Concern for possible sepsis, however pt is without fever and labs are reassuring.  Faint  rales on lung exam, however CXR without overt pulmonary edema and no elevation of BNP.  No hypoxia or increased work of breathing.  No hx of CHF.  Lasix given.  Pt will need admission for cellulitis.    The patient was discussed with and seen by Dr. Manus Gunning who agrees with the treatment plan.   Final Clinical Impressions(s) / ED Diagnoses   Final diagnoses:  Peripheral edema  Cellulitis of left lower extremity    ED Discharge Orders    None       Mardene Sayer Boyd Kerbs 03/29/19 0239    Glynn Octave, MD 03/29/19 1728

## 2019-03-28 NOTE — ED Triage Notes (Signed)
Patient arrived by EMS from [facility]. PT has edema bilaterally in both RT and LFT legs.   Hx of DM.   Nurse at facility stated patient has been non-compliant taking his Lasix medication.

## 2019-03-29 ENCOUNTER — Inpatient Hospital Stay (HOSPITAL_COMMUNITY): Payer: Medicare Other

## 2019-03-29 ENCOUNTER — Encounter (HOSPITAL_COMMUNITY): Payer: Self-pay | Admitting: Internal Medicine

## 2019-03-29 DIAGNOSIS — I4510 Unspecified right bundle-branch block: Secondary | ICD-10-CM | POA: Diagnosis present

## 2019-03-29 DIAGNOSIS — E1122 Type 2 diabetes mellitus with diabetic chronic kidney disease: Secondary | ICD-10-CM

## 2019-03-29 DIAGNOSIS — R600 Localized edema: Secondary | ICD-10-CM

## 2019-03-29 DIAGNOSIS — L03115 Cellulitis of right lower limb: Secondary | ICD-10-CM | POA: Diagnosis present

## 2019-03-29 DIAGNOSIS — N400 Enlarged prostate without lower urinary tract symptoms: Secondary | ICD-10-CM | POA: Diagnosis present

## 2019-03-29 DIAGNOSIS — I2602 Saddle embolus of pulmonary artery with acute cor pulmonale: Secondary | ICD-10-CM | POA: Diagnosis not present

## 2019-03-29 DIAGNOSIS — L03116 Cellulitis of left lower limb: Secondary | ICD-10-CM | POA: Diagnosis present

## 2019-03-29 DIAGNOSIS — N183 Chronic kidney disease, stage 3 (moderate): Secondary | ICD-10-CM

## 2019-03-29 DIAGNOSIS — I6930 Unspecified sequelae of cerebral infarction: Secondary | ICD-10-CM

## 2019-03-29 DIAGNOSIS — R609 Edema, unspecified: Secondary | ICD-10-CM | POA: Diagnosis not present

## 2019-03-29 DIAGNOSIS — E1169 Type 2 diabetes mellitus with other specified complication: Secondary | ICD-10-CM | POA: Diagnosis present

## 2019-03-29 DIAGNOSIS — Z9114 Patient's other noncompliance with medication regimen: Secondary | ICD-10-CM | POA: Diagnosis not present

## 2019-03-29 DIAGNOSIS — Z7902 Long term (current) use of antithrombotics/antiplatelets: Secondary | ICD-10-CM | POA: Diagnosis not present

## 2019-03-29 DIAGNOSIS — I82402 Acute embolism and thrombosis of unspecified deep veins of left lower extremity: Secondary | ICD-10-CM | POA: Diagnosis not present

## 2019-03-29 DIAGNOSIS — I440 Atrioventricular block, first degree: Secondary | ICD-10-CM | POA: Diagnosis present

## 2019-03-29 DIAGNOSIS — Z79899 Other long term (current) drug therapy: Secondary | ICD-10-CM | POA: Diagnosis not present

## 2019-03-29 DIAGNOSIS — I130 Hypertensive heart and chronic kidney disease with heart failure and stage 1 through stage 4 chronic kidney disease, or unspecified chronic kidney disease: Secondary | ICD-10-CM | POA: Diagnosis present

## 2019-03-29 DIAGNOSIS — E876 Hypokalemia: Secondary | ICD-10-CM | POA: Diagnosis present

## 2019-03-29 DIAGNOSIS — E861 Hypovolemia: Secondary | ICD-10-CM | POA: Diagnosis not present

## 2019-03-29 DIAGNOSIS — E785 Hyperlipidemia, unspecified: Secondary | ICD-10-CM | POA: Diagnosis present

## 2019-03-29 DIAGNOSIS — I2693 Single subsegmental pulmonary embolism without acute cor pulmonale: Secondary | ICD-10-CM | POA: Diagnosis present

## 2019-03-29 DIAGNOSIS — F0150 Vascular dementia without behavioral disturbance: Secondary | ICD-10-CM | POA: Diagnosis present

## 2019-03-29 DIAGNOSIS — I2699 Other pulmonary embolism without acute cor pulmonale: Secondary | ICD-10-CM | POA: Diagnosis not present

## 2019-03-29 DIAGNOSIS — I69354 Hemiplegia and hemiparesis following cerebral infarction affecting left non-dominant side: Secondary | ICD-10-CM | POA: Diagnosis not present

## 2019-03-29 DIAGNOSIS — E538 Deficiency of other specified B group vitamins: Secondary | ICD-10-CM | POA: Diagnosis present

## 2019-03-29 DIAGNOSIS — Z20828 Contact with and (suspected) exposure to other viral communicable diseases: Secondary | ICD-10-CM | POA: Diagnosis present

## 2019-03-29 DIAGNOSIS — I82412 Acute embolism and thrombosis of left femoral vein: Secondary | ICD-10-CM | POA: Diagnosis present

## 2019-03-29 DIAGNOSIS — I5022 Chronic systolic (congestive) heart failure: Secondary | ICD-10-CM | POA: Diagnosis present

## 2019-03-29 LAB — LACTIC ACID, PLASMA: Lactic Acid, Venous: 1 mmol/L (ref 0.5–1.9)

## 2019-03-29 LAB — COMPREHENSIVE METABOLIC PANEL
ALT: 9 U/L (ref 0–44)
ALT: 7 U/L (ref 0–44)
AST: 13 U/L — ABNORMAL LOW (ref 15–41)
AST: 13 U/L — ABNORMAL LOW (ref 15–41)
Albumin: 3 g/dL — ABNORMAL LOW (ref 3.5–5.0)
Albumin: 3 g/dL — ABNORMAL LOW (ref 3.5–5.0)
Alkaline Phosphatase: 53 U/L (ref 38–126)
Alkaline Phosphatase: 52 U/L (ref 38–126)
Anion gap: 8 (ref 5–15)
Anion gap: 8 (ref 5–15)
BUN: 24 mg/dL — ABNORMAL HIGH (ref 8–23)
BUN: 23 mg/dL (ref 8–23)
CO2: 27 mmol/L (ref 22–32)
CO2: 28 mmol/L (ref 22–32)
Calcium: 8.7 mg/dL — ABNORMAL LOW (ref 8.9–10.3)
Calcium: 8.5 mg/dL — ABNORMAL LOW (ref 8.9–10.3)
Chloride: 104 mmol/L (ref 98–111)
Chloride: 105 mmol/L (ref 98–111)
Creatinine, Ser: 0.92 mg/dL (ref 0.61–1.24)
Creatinine, Ser: 0.84 mg/dL (ref 0.61–1.24)
GFR calc Af Amer: >60 mL/min (ref >60)
GFR calc Af Amer: >60 mL/min (ref >60)
GFR calc non Af Amer: >60 mL/min (ref >60)
GFR calc non Af Amer: >60 mL/min (ref >60)
Glucose, Bld: 81 mg/dL (ref 70–99)
Glucose, Bld: 84 mg/dL (ref 70–99)
Potassium: 3.8 mmol/L (ref 3.5–5.1)
Potassium: 3.2 mmol/L — ABNORMAL LOW (ref 3.5–5.1)
Sodium: 139 mmol/L (ref 135–145)
Sodium: 141 mmol/L (ref 135–145)
Total Bilirubin: 0.8 mg/dL (ref 0.3–1.2)
Total Bilirubin: 1 mg/dL (ref 0.3–1.2)
Total Protein: 6.2 g/dL — ABNORMAL LOW (ref 6.5–8.1)
Total Protein: 6.4 g/dL — ABNORMAL LOW (ref 6.5–8.1)

## 2019-03-29 LAB — HEMOGLOBIN A1C
Hgb A1c MFr Bld: 5.5 % (ref 4.8–5.6)
Mean Plasma Glucose: 111.15 mg/dL

## 2019-03-29 LAB — URINALYSIS, ROUTINE W REFLEX MICROSCOPIC
Bacteria, UA: NONE SEEN
Bilirubin Urine: NEGATIVE
Glucose, UA: NEGATIVE mg/dL
Hgb urine dipstick: NEGATIVE
Ketones, ur: NEGATIVE mg/dL
Nitrite: NEGATIVE
Protein, ur: NEGATIVE mg/dL
Specific Gravity, Urine: 1.019 (ref 1.005–1.030)
pH: 5 (ref 5.0–8.0)

## 2019-03-29 LAB — CBC
HCT: 36.7 % — ABNORMAL LOW (ref 39.0–52.0)
Hemoglobin: 11.3 g/dL — ABNORMAL LOW (ref 13.0–17.0)
MCH: 27.7 pg (ref 26.0–34.0)
MCHC: 30.8 g/dL (ref 30.0–36.0)
MCV: 90 fL (ref 80.0–100.0)
Platelets: 249 10*3/uL (ref 150–400)
RBC: 4.08 MIL/uL — ABNORMAL LOW (ref 4.22–5.81)
RDW: 14.9 % (ref 11.5–15.5)
WBC: 7.7 10*3/uL (ref 4.0–10.5)
nRBC: 0 % (ref 0.0–0.2)

## 2019-03-29 LAB — SARS CORONAVIRUS 2 (TAT 6-24 HRS): SARS Coronavirus 2: NEGATIVE

## 2019-03-29 LAB — TROPONIN I (HIGH SENSITIVITY): Troponin I (High Sensitivity): 11 ng/L (ref <18)

## 2019-03-29 LAB — GLUCOSE, CAPILLARY
Glucose-Capillary: 76 mg/dL (ref 70–99)
Glucose-Capillary: 134 mg/dL — ABNORMAL HIGH (ref 70–99)
Glucose-Capillary: 89 mg/dL (ref 70–99)
Glucose-Capillary: 99 mg/dL (ref 70–99)

## 2019-03-29 LAB — BRAIN NATRIURETIC PEPTIDE: B Natriuretic Peptide: 88 pg/mL (ref 0.0–100.0)

## 2019-03-29 LAB — MRSA PCR SCREENING: MRSA by PCR: NEGATIVE

## 2019-03-29 MED ORDER — SIMVASTATIN 10 MG PO TABS
20.0000 mg | ORAL_TABLET | Freq: Every day | ORAL | Status: DC
  Administered 2019-03-29 – 2019-03-31 (×3): 20 mg via ORAL
  Filled 2019-03-29 (×3): qty 2

## 2019-03-29 MED ORDER — SODIUM CHLORIDE 0.9 % IV SOLN: 250.0000 mL | INTRAVENOUS | Status: DC | PRN

## 2019-03-29 MED ORDER — INSULIN ASPART 100 UNIT/ML ~~LOC~~ SOLN
0.0000 [IU] | Freq: Three times a day (TID) | SUBCUTANEOUS | Status: DC
  Administered 2019-03-29: 13:00:00 1 [IU] via SUBCUTANEOUS

## 2019-03-29 MED ORDER — ACETAMINOPHEN 325 MG PO TABS: 650.0000 mg | ORAL_TABLET | Freq: Four times a day (QID) | ORAL | Status: DC | PRN

## 2019-03-29 MED ORDER — FUROSEMIDE 10 MG/ML IJ SOLN
20.0000 mg | Freq: Once | INTRAMUSCULAR | Status: AC
  Administered 2019-03-29: 02:00:00 20 mg via INTRAVENOUS
  Filled 2019-03-29: qty 4

## 2019-03-29 MED ORDER — ENSURE ENLIVE PO LIQD
237.0000 mL | Freq: Two times a day (BID) | ORAL | Status: DC
  Administered 2019-03-29 – 2019-04-01 (×6): 237 mL via ORAL

## 2019-03-29 MED ORDER — LISINOPRIL 5 MG PO TABS
5.0000 mg | ORAL_TABLET | Freq: Every day | ORAL | Status: DC
  Administered 2019-03-29: 5 mg via ORAL
  Filled 2019-03-29: qty 1

## 2019-03-29 MED ORDER — RIVAROXABAN 15 MG PO TABS
15.0000 mg | ORAL_TABLET | Freq: Two times a day (BID) | ORAL | Status: DC
  Administered 2019-03-29 – 2019-03-30 (×2): 15 mg via ORAL
  Filled 2019-03-29 (×2): qty 1

## 2019-03-29 MED ORDER — CLOPIDOGREL BISULFATE 75 MG PO TABS
75.0000 mg | ORAL_TABLET | Freq: Every day | ORAL | Status: DC
  Administered 2019-03-29 – 2019-04-01 (×4): 75 mg via ORAL
  Filled 2019-03-29 (×4): qty 1

## 2019-03-29 MED ORDER — FUROSEMIDE 10 MG/ML IJ SOLN
40.0000 mg | Freq: Every day | INTRAMUSCULAR | Status: DC
  Administered 2019-03-29: 40 mg via INTRAVENOUS
  Filled 2019-03-29: qty 4

## 2019-03-29 MED ORDER — SENNA 8.6 MG PO TABS
1.0000 | ORAL_TABLET | Freq: Two times a day (BID) | ORAL | Status: DC
  Administered 2019-03-29 – 2019-04-01 (×7): 8.6 mg via ORAL
  Filled 2019-03-29 (×7): qty 1

## 2019-03-29 MED ORDER — TRAMADOL HCL 50 MG PO TABS: 50.0000 mg | ORAL_TABLET | Freq: Four times a day (QID) | ORAL | Status: DC | PRN

## 2019-03-29 MED ORDER — ACETAMINOPHEN 650 MG RE SUPP: 650.0000 mg | Freq: Four times a day (QID) | RECTAL | Status: DC | PRN

## 2019-03-29 MED ORDER — POTASSIUM CHLORIDE CRYS ER 20 MEQ PO TBCR
40.0000 meq | EXTENDED_RELEASE_TABLET | Freq: Once | ORAL | Status: AC
  Administered 2019-03-29: 09:00:00 40 meq via ORAL
  Filled 2019-03-29: qty 2

## 2019-03-29 MED ORDER — SODIUM CHLORIDE 0.9% FLUSH: 3.0000 mL | INTRAVENOUS | Status: DC | PRN

## 2019-03-29 MED ORDER — SODIUM CHLORIDE 0.9 % IV SOLN
1.0000 g | INTRAVENOUS | Status: DC
  Administered 2019-03-30: 21:00:00 1 g via INTRAVENOUS
  Filled 2019-03-29: qty 1

## 2019-03-29 MED ORDER — SODIUM CHLORIDE 0.9 % IV BOLUS
1000.0000 mL | Freq: Once | INTRAVENOUS | Status: AC
  Administered 2019-03-29: 1000 mL via INTRAVENOUS

## 2019-03-29 MED ORDER — POLYETHYLENE GLYCOL 3350 17 G PO PACK
17.0000 g | PACK | Freq: Every day | ORAL | Status: DC
  Administered 2019-03-29 – 2019-04-01 (×4): 17 g via ORAL
  Filled 2019-03-29 (×4): qty 1

## 2019-03-29 MED ORDER — VITAMIN B-12 100 MCG PO TABS
100.0000 ug | ORAL_TABLET | Freq: Every day | ORAL | Status: DC
  Administered 2019-03-29 – 2019-04-01 (×4): 100 ug via ORAL
  Filled 2019-03-29 (×4): qty 1

## 2019-03-29 MED ORDER — VANCOMYCIN HCL 10 G IV SOLR
1750.0000 mg | Freq: Once | INTRAVENOUS | Status: AC
  Administered 2019-03-29: 06:00:00 1750 mg via INTRAVENOUS
  Filled 2019-03-29: qty 1750

## 2019-03-29 MED ORDER — INSULIN ASPART 100 UNIT/ML ~~LOC~~ SOLN: 0.0000 [IU] | Freq: Every day | SUBCUTANEOUS | Status: DC

## 2019-03-29 MED ORDER — ENOXAPARIN SODIUM 40 MG/0.4ML ~~LOC~~ SOLN
40.0000 mg | Freq: Every morning | SUBCUTANEOUS | Status: DC
  Administered 2019-03-29: 40 mg via SUBCUTANEOUS
  Filled 2019-03-29: qty 0.4

## 2019-03-29 MED ORDER — POTASSIUM CHLORIDE ER 10 MEQ PO TBCR: 10.0000 meq | EXTENDED_RELEASE_TABLET | Freq: Every day | ORAL | Status: DC

## 2019-03-29 MED ORDER — VANCOMYCIN HCL 10 G IV SOLR
1250.0000 mg | INTRAVENOUS | Status: DC
  Administered 2019-03-30: 1250 mg via INTRAVENOUS
  Filled 2019-03-29 (×2): qty 1250

## 2019-03-29 MED ORDER — TAMSULOSIN HCL 0.4 MG PO CAPS
0.4000 mg | ORAL_CAPSULE | Freq: Every day | ORAL | Status: DC
  Administered 2019-03-29 – 2019-03-31 (×3): 0.4 mg via ORAL
  Filled 2019-03-29 (×3): qty 1

## 2019-03-29 NOTE — Progress Notes (Signed)
PROGRESS NOTE                                                                                                                                                                                                             Patient Demographics:    Mark Bullock, is a 85 y.o. male, DOB - Aug 22, 1928, ZOX:[id number]  Admit date - 03/28/2019   Admitting Physician Pearson GrippeJames Kim, MD  Outpatient Primary MD for the patient is [name], [name], MD  LOS - 0  Outpatient Specialists: none  Chief Complaint  Patient presents with  . Leg Swelling       Brief Narrative 85 year old male resident of assisted living with history of diabetes mellitus, history of stroke with left-sided weakness, hypertension, hyperlipidemia presented with progressive worsening bilateral leg swellings (L >R) of unknown duration with some pain.  Patient on Lasix and as per the facility who told EMS that patient has been noncompliant with his Lasix.  Patient is a poor historian.  Denies any fevers or chills.  Patient noted to have increased redness and warmth over the lower extremities (L >R). In the ED he was afebrile.  Blood pressure soft (99/61 mmHg).  Blood work showed normal WBC, mildly elevated BUN of 24 and normal creatinine. Chest x-ray without acute findings.  UA unremarkable. Admitted for significant cellulitis requiring IV antibiotics.    Subjective:   Patient complains of some discomfort in his legs.  Does not remember how long he has had the symptoms.   Assessment  & Plan :    Principal Problem:   Cellulitis of bilateral lower extremity (L >R) Has bilateral leg swellings with what appears chronic skin changes but with skin breakdown.  Has significant warmth and some tenderness in his left leg but no fluctuation.  Cellulitis extends up to the upper tibia. On empiric IV vancomycin and Rocephin.  Switch to IV Ancef if blood culture negative.  Patient  will need at least 24-48 hours of IV antibiotics given the extent of cellulitis.    Active Problems:   Bilateral lower extremity edema Doppler lower extremity to rule out DVT.  IV Lasix 40 mg once daily  History of CVA with residual left-sided weakness Continue Plavix and statin.  Reports ambulating with walker.  Essential hypertension Continue lisinopril  BPH Continue Flomax  B12 deficiency Continue supplement  Type 2 diabetes mellitus with CKD stage III Stable with A1c of 5.5.  Monitor on sliding scale coverage  Hypokalemia Replenished   Patient status: Inpatient Patient has significant cellulitis of bilateral lower extremity (L >R) with leg edema with high risk of developing abscess and sepsis given his underlying comorbidities.  He needs to be monitored in the inpatient setting for >2 midnight (at least 48 hours) with IV antibiotics.   Code Status : Full code  Family Communication  : None  Disposition Plan  : Return to ALF possibly in the next 48 hours if symptoms improve  Barriers For Discharge : Active symptoms  Consults  : None  Procedures  : Doppler lower extremity  DVT Prophylaxis  :  Lovenox -  Lab Results  Component Value Date   PLT 249 03/29/2019    Antibiotics  :   Anti-infectives (From admission, onward)   Start     Dose/Rate Route Frequency Ordered Stop   03/30/19 2100  cefTRIAXone (ROCEPHIN) 1 g in sodium chloride 0.9 % 100 mL IVPB     1 g 200 mL/hr over 30 Minutes Intravenous Every 24 hours 03/29/19 0431     03/30/19 0800  vancomycin (VANCOCIN) 1,250 mg in sodium chloride 0.9 % 250 mL IVPB     1,250 mg 166.7 mL/hr over 90 Minutes Intravenous Every 24 hours 03/29/19 0509     03/29/19 0445  vancomycin (VANCOCIN) 1,750 mg in sodium chloride 0.9 % 500 mL IVPB     1,750 mg 250 mL/hr over 120 Minutes Intravenous  Once 03/29/19 0435 03/29/19 0731   03/28/19 2300  cefTRIAXone (ROCEPHIN) 2 g in sodium chloride 0.9 % 100 mL IVPB  Status:   Discontinued     2 g 200 mL/hr over 30 Minutes Intravenous Every 24 hours 03/28/19 2256 03/29/19 0503        Objective:   Vitals:   03/29/19 0130 03/29/19 0300 03/29/19 0411 03/29/19 0417  BP: 110/66 117/62 107/72   Pulse: 63 63 79   Resp: 16 20 16    Temp:   97.6 F (36.4 C)   TempSrc:   Oral   SpO2: 100% 97% 99%   Weight:    79.7 kg  Height:    5\' 8"  (1.727 m)    Wt Readings from Last 3 Encounters:  03/29/19 79.7 kg  01/18/18 103.4 kg  07/11/17 99.9 kg     Intake/Output Summary (Last 24 hours) at 03/29/2019 1150 Last data filed at 03/29/2019 1105 Gross per 24 hour  Intake 106.61 ml  Output 600 ml  Net -493.39 ml     Physical Exam  Gen: not in distress HEENT: moist mucosa, supple neck Chest: clear b/l, no added sounds CVS: N S1&S2, no murmurs, GI: soft, NT, ND,  Musculoskeletal: Bilateral lower extremity erythema (L >R) and leg edema.  Warmth and tenderness over the left lower extremity     Data Review:    CBC Recent Labs  Lab 03/28/19 2249 03/29/19 0449  WBC 7.9 7.7  HGB 11.4* 11.3*  HCT 36.2* 36.7*  PLT 244 249  MCV 89.4 90.0  MCH 28.1 27.7  MCHC 31.5 30.8  RDW 15.1 14.9  LYMPHSABS 1.5  --   MONOABS 0.7  --   EOSABS 0.1  --   BASOSABS 0.0  --     Chemistries  Recent Labs  Lab 03/28/19 2249 03/29/19 0449  NA 139 141  K 3.8 3.2*  CL 104 105  CO2 27 28  GLUCOSE  81 84  BUN 24* 23  CREATININE 0.92 0.84  CALCIUM 8.7* 8.5*  AST 13* 13*  ALT 9 7  ALKPHOS 53 52  BILITOT 0.8 1.0   ------------------------------------------------------------------------------------------------------------------ No results for input(s): CHOL, HDL, LDLCALC, TRIG, CHOLHDL, LDLDIRECT in the last 72 hours.  Lab Results  Component Value Date   HGBA1C 5.5 03/29/2019   ------------------------------------------------------------------------------------------------------------------ No results for input(s): TSH, T4TOTAL, T3FREE, THYROIDAB in the last 72  hours.  Invalid input(s): FREET3 ------------------------------------------------------------------------------------------------------------------ No results for input(s): VITAMINB12, FOLATE, FERRITIN, TIBC, IRON, RETICCTPCT in the last 72 hours.  Coagulation profile Recent Labs  Lab 03/28/19 2254  INR 1.1    No results for input(s): DDIMER in the last 72 hours.  Cardiac Enzymes No results for input(s): CKMB, TROPONINI, MYOGLOBIN in the last 168 hours.  Invalid input(s): CK ------------------------------------------------------------------------------------------------------------------    Component Value Date/Time   BNP 88.0 03/28/2019 2249    Inpatient Medications  Scheduled Meds: . clopidogrel  75 mg Oral Q breakfast  . enoxaparin (LOVENOX) injection  40 mg Subcutaneous q morning - 10a  . feeding supplement (ENSURE ENLIVE)  237 mL Oral BID BM  . furosemide  40 mg Intravenous Daily  . insulin aspart  0-5 Units Subcutaneous QHS  . insulin aspart  0-9 Units Subcutaneous TID WC  . lisinopril  5 mg Oral Daily  . polyethylene glycol  17 g Oral Daily  . senna  1 tablet Oral BID  . simvastatin  20 mg Oral QHS  . tamsulosin  0.4 mg Oral QHS  . vitamin B-12  100 mcg Oral Daily   Continuous Infusions: . sodium chloride    . [START ON 03/30/2019] cefTRIAXone (ROCEPHIN)  IV    . [START ON 03/30/2019] vancomycin     PRN Meds:.sodium chloride, acetaminophen **OR** acetaminophen, sodium chloride flush, traMADol  Micro Results Recent Results (from the past 240 hour(s))  MRSA PCR Screening     Status: None   Collection Time: 03/29/19  4:00 AM   Specimen: Nasal Mucosa; Nasopharyngeal  Result Value Ref Range Status   MRSA by PCR NEGATIVE NEGATIVE Final    Comment:        The GeneXpert MRSA Assay (FDA approved for NASAL specimens only), is one component of a comprehensive MRSA colonization surveillance program. It is not intended to diagnose MRSA infection nor to guide or  monitor treatment for MRSA infections. Performed at [facility], [place] [address], [place], [place] [address]     Radiology Reports Dg Chest Port 1 View  Result Date: 03/28/2019 CLINICAL DATA:  Edema, rales EXAM: PORTABLE CHEST 1 VIEW COMPARISON:  January 10, 2018 FINDINGS: The heart size and mediastinal contours are unchanged. There is a tortuous descending aorta. Elevation of the right hemidiaphragm. The lungs are clear. No focal airspace consolidation or pleural effusion. No acute osseous abnormality. IMPRESSION: No acute cardiopulmonary process. Electronically Signed   By: Prudencio Pair M.D.   On: 03/28/2019 23:57    Time Spent in minutes  25   Damier Disano M.D on 03/29/2019 at 11:50 AM  Between 7am to 7pm - Pager - [phone]  After 7pm go to [email or web address] - password [email or web address]  [facility] -  Office  [phone]

## 2019-03-29 NOTE — Progress Notes (Signed)
New Admission Note:    Arrival Method: Bed Mental Orientation: A X O X 2, slightly confused, Speech rambles Telemetry: Box 59 Skin: Wounds open oozing both legs Iv:  L AC  R Hand Pain: 0/10 Safety Measures: Safety Fall Prevention Plan has been given, discussed and signed Admission: Completed [facility] Orientation: Patient has been orientated to the room, unit, and staff.   Orders have been reviewed and implemented. Will continue to monitor the patient. Call light has been placed within reach and bed alarm has been activated. Vitals below.   Today's Vitals   03/29/19 0130 03/29/19 0300 03/29/19 0411 03/29/19 0417  BP: 110/66 117/62 107/72   Pulse: 63 63 79   Resp: 16 20 16    Temp:   97.6 F (36.4 C)   TempSrc:   Oral   SpO2: 100% 97% 99%   Weight:    79.7 kg  Height:    5\' 8"  (1.727 m)  PainSc:   0-No pain    Body mass index is 26.73 kg/m.

## 2019-03-29 NOTE — TOC Initial Note (Signed)
Transition of Care [facility]) - Initial/Assessment Note    Patient Details  Name: Mark Bullock MRN: [id number] Date of Birth: [birth year]  [facility] CM/SW Contact:    Nila Nephew, LCSW Phone Number: [phone] (weekend coverage [phone]) 03/29/2019, 12:23 PM  Clinical Narrative:       Pt admitted with cellulitis from [facility]. Will plan to return once treatment concluded.  Updated pt's daughter (below). Will be in contact with ALF to coordinate at DC.            Expected Discharge Plan: Assisted Living Barriers to Discharge: Continued Medical Work up   Patient Goals and CMS Choice Patient states their goals for this hospitalization and ongoing recovery are:: treat leg pain      Expected Discharge Plan and Services Expected Discharge Plan: Assisted Living In-house Referral: Clinical Social Work     Living arrangements for the past 2 months: [place]                                      Prior Living Arrangements/Services Living arrangements for the past 2 months: [place] Lives with:: Facility Resident Patient language and need for interpreter reviewed:: No Do you feel safe going back to the place where you live?: Yes      Need for Family Participation in Patient Care: Yes (Comment)(daughters) Care giver support system in place?: Yes (comment)(ALF resident)   Criminal Activity/Legal Involvement Pertinent to Current Situation/Hospitalization: No - Comment as needed  Activities of Daily Living Home Assistive Devices/Equipment: Dentures (specify type), Walker (specify type) ADL Screening (condition at time of admission) Patient's cognitive ability adequate to safely complete daily activities?: No Is the patient deaf or have difficulty hearing?: No Does the patient have difficulty seeing, even when wearing glasses/contacts?: No Does the patient have difficulty concentrating, remembering, or  making decisions?: Yes Patient able to express need for assistance with ADLs?: Yes Does the patient have difficulty dressing or bathing?: Yes Independently performs ADLs?: No In/Out Bed: Needs assistance Is this a change from baseline?: Pre-admission baseline Walks in Home: Needs assistance Is this a change from baseline?: Pre-admission baseline Does the patient have difficulty walking or climbing stairs?: Yes Weakness of Legs: Both Weakness of Arms/Hands: None  Permission Sought/Granted Permission sought to share information with : Facility Sport and exercise psychologist, Family Supports Permission granted to share information with : Yes, Verbal Permission Granted  Share Information with NAME: [phone] daughter Tomasa Hosteller  Permission granted to share info w AGENCY: [facility] [phone]        Emotional Assessment              Admission diagnosis:  Peripheral edema [R60.9] Cellulitis of left lower extremity [L03.116] Patient Active Problem List   Diagnosis Date Noted  . Cellulitis 03/29/2019  . Cellulitis of leg, left 03/29/2019  . Chronic cerebrovascular accident (CVA) 07/07/2017  . Essential hypertension, benign 05/24/2017  . Type II diabetes mellitus with stage 3 chronic kidney disease ([place]) 05/24/2017  . Dyslipidemia associated with type 2 diabetes mellitus ([place]) 05/24/2017  . Vascular dementia without behavioral disturbance ([place]) 05/24/2017  . Vitamin B 12 deficiency 05/24/2017  . Rhabdomyolysis 05/19/2017  . AKI (acute kidney injury) ([place]) 05/18/2017  . Elevated troponin 05/18/2017  . History of completed stroke 05/18/2017  . CKD (chronic kidney disease), stage III ([place]) 05/18/2017  . Physical deconditioning 05/08/2017  .  Osteoarthritis 05/08/2017  . Bilateral lower extremity edema   . Abnormal EKG   . Elevated brain natriuretic peptide (BNP) level    PCP:  [name], [name], MD Pharmacy:   [id number] [place]- [place], [place] - [address] [place] [id number] Phone: [phone] Fax: [phone]     Social Determinants of Health (SDOH) Interventions    Readmission Risk Interventions No flowsheet data found.

## 2019-03-29 NOTE — Progress Notes (Addendum)
Pharmacy Antibiotic Note  Mark Bullock is a 85 y.o. male admitted on 03/28/2019 with cellulitis.  Pharmacy has been consulted for vancomycin dosing.  Plan: Vancomycin 1750 mg x1 then 1250 mg IV q24h for est AUC = 444 Goal AUC = 400-550 Rocephin 1 Gm IV q24h (MD) F/u scr/cultures/levels  Height: 5\' 8"  (172.7 cm) Weight: 175 lb 12.8 oz (79.7 kg) IBW/kg (Calculated) : 68.4  Temp (24hrs), Avg:97.8 F (36.6 C), Min:97.5 F (36.4 C), Max:98.3 F (36.8 C)  Recent Labs  Lab 03/28/19 2249 03/28/19 2254  WBC 7.9  --   CREATININE 0.92  --   LATICACIDVEN  --  1.0    Estimated Creatinine Clearance: 53.7 mL/min (by C-G formula based on SCr of 0.92 mg/dL).    No Known Allergies  Antimicrobials this admission: 9/19 rocephin >>  9/19 vancomycin >>   Dose adjustments this admission:   Microbiology results:  BCx:   UCx:    Sputum:    MRSA PCR:   Thank you for allowing pharmacy to be a part of this patient's care.  Dorrene German 03/29/2019 5:05 AM

## 2019-03-29 NOTE — Progress Notes (Signed)
LLE positive for DVT. Will place patient on xarelto.

## 2019-03-29 NOTE — Progress Notes (Signed)
LE venous duplex       has been completed. Preliminary results can be found under CV proc through chart review. June Leap, BS, RDMS, RVT    Positive results called to Vanita Ingles, RN

## 2019-03-29 NOTE — H&P (Signed)
TRH H&P    Patient Demographics:    Mark Bullock, is a 85 y.o. male  MRN: [id number]  DOB - 10-17-1928  Admit Date - 03/28/2019  Referring MD/NP/PA:  Lorelle Formosa Muthersbaugh  Outpatient Primary MD for the patient is Burton Apley, MD  Patient coming from:  [facility]  Chief complaint-  cellulitis   HPI:    Mark Bullock  is a 85 y.o. male, w hypertension, hyperlipidemia, Dm2, h/o CVA, ? Dementia  apparently presents with c/o redness and warmth of the left distal lower ext.  Pt is not sure how long it has been going on.  It was apparently noticed by nursing staff at ALF.   In ED,  T 98.3, P 77 R 16 Bp 99/61 pox 100% on RA  CXR IMPRESSION: No acute cardiopulmonary process.  Urinalysis negative  Wbc 7.9, Hgb 11.4, Plt 244 Na 139, K 3.8, Bun 24, Creatinine 0.92 Ast 13, Alt 9 BNP 88 Lactic acid 1  INR 1.1  Trop 11  Pt will be admitted for cellulitis    Review of systems:    In addition to the HPI above,  No Fever-chills, No Headache, No changes with Vision or hearing, No problems swallowing food or Liquids, No Chest pain, Cough or Shortness of Breath, No Abdominal pain, No Nausea or Vomiting, bowel movements are regular, No Blood in stool or Urine, No dysuria,  No new joints pains-aches,  No new weakness, tingling, numbness in any extremity, No recent weight gain or loss, No polyuria, polydypsia or polyphagia, No significant Mental Stressors.  All other systems reviewed and are negative.    Past History of the following :    Past Medical History:  Diagnosis Date  . AKI (acute kidney injury) (HCC) 05/18/2017  . Diabetes mellitus without complication (HCC)   . Hyperlipidemia   . Hypertension   . Left-sided weakness   . Stroke [facility])       Past Surgical History:  Procedure Laterality Date  . APPENDECTOMY    . KNEE SURGERY        Social History:       Social History   Tobacco Use  . Smoking status: Never Smoker  . Smokeless tobacco: Never Used  Substance Use Topics  . Alcohol use: No       Family History :     Family History  Family history unknown: Yes   Pt doesn't recall any family history, pt states parents died in their 74's of old age    Home Medications:   Prior to Admission medications   Medication Sig Start Date End Date Taking? Authorizing Provider  clopidogrel (PLAVIX) 75 MG tablet Take 75 mg by mouth daily. 01/22/15  Yes [provider]  furosemide (LASIX) 40 MG tablet Take 1 tablet (40 mg total) by mouth daily. 01/18/18  Yes Linwood Dibbles, MD  lisinopril (PRINIVIL,ZESTRIL) 5 MG tablet Take 5 mg by mouth daily. 12/20/17  Yes [provider]  polyethylene glycol (MIRALAX / GLYCOLAX) 17 g packet Take 17 g by mouth daily.  Yes [provider]  potassium chloride (K-DUR) 10 MEQ tablet Take 10 mEq by mouth daily.  12/20/17  Yes [provider]  senna (SENOKOT) 8.6 MG tablet Take 1 tablet by mouth 2 (two) times daily.   Yes [provider]  tamsulosin (FLOMAX) 0.4 MG CAPS capsule Take 0.4 mg by mouth at bedtime.   Yes [provider]  traMADol (ULTRAM) 50 MG tablet Take 1 tablet (50 mg total) by mouth every 6 (six) hours as needed. Patient taking differently: Take 50 mg by mouth every 6 (six) hours as needed for moderate pain or severe pain.  01/22/18  Yes Duffy Bruce, MD  vitamin B-12 (CYANOCOBALAMIN) 100 MCG tablet Take 100 mcg by mouth daily.   Yes [provider]  guaiFENesin (MUCINEX) 600 MG 12 hr tablet Take 1 tablet (600 mg total) 2 (two) times daily by mouth. Patient not taking: Reported on 03/29/2019 05/21/17   Rai, Vernelle Emerald, MD  simvastatin (ZOCOR) 20 MG tablet Take 20 mg by mouth daily. 01/22/15   [provider]     Allergies:    No Known Allergies   Physical Exam:   Vitals  Blood pressure 107/72, pulse 79, temperature 97.6 F (36.4  C), temperature source Oral, resp. rate 16, height 5\' 8"  (1.727 m), weight 79.7 kg, SpO2 99 %.  1.  General: axox2 (person, year)  2. Psychiatric: euthymic  3. Neurologic: cn2-12 intact, reflexes 2+ symmetric, diffuse with no clonus, motor 5/5 in all 4ext  4. HEENMT:  Anicteric, pupils 1.68mm symmetric, direct, consensual, intact Neck: no jvd  5. Respiratory : CTAB  6. Cardiovascular : rrr s1, s2,   7. Gastrointestinal:  Abd: soft, nt, nd, +bs  8. Skin:  Ext: no c/c/ 1+ edema (looks like lymphedema, chronic), redness and warmth of the left distal lower ext about 3/4 up to knee with cracks vertical in the skin about 8cm in length.   9.Musculoskeletal:  Good ROM    Data Review:    CBC Recent Labs  Lab 03/28/19 2249  WBC 7.9  HGB 11.4*  HCT 36.2*  PLT 244  MCV 89.4  MCH 28.1  MCHC 31.5  RDW 15.1  LYMPHSABS 1.5  MONOABS 0.7  EOSABS 0.1  BASOSABS 0.0   ------------------------------------------------------------------------------------------------------------------  Results for orders placed or performed during the hospital encounter of 03/28/19 (from the past 48 hour(s))  CBC with Differential/Platelet     Status: Abnormal   Collection Time: 03/28/19 10:49 PM  Result Value Ref Range   WBC 7.9 4.0 - 10.5 K/uL   RBC 4.05 (L) 4.22 - 5.81 MIL/uL   Hemoglobin 11.4 (L) 13.0 - 17.0 g/dL   HCT 36.2 (L) 39.0 - 52.0 %   MCV 89.4 80.0 - 100.0 fL   MCH 28.1 26.0 - 34.0 pg   MCHC 31.5 30.0 - 36.0 g/dL   RDW 15.1 11.5 - 15.5 %   Platelets 244 150 - 400 K/uL   nRBC 0.0 0.0 - 0.2 %   Neutrophils Relative % 70 %   Neutro Abs 5.5 1.7 - 7.7 K/uL   Lymphocytes Relative 19 %   Lymphs Abs 1.5 0.7 - 4.0 K/uL   Monocytes Relative 9 %   Monocytes Absolute 0.7 0.1 - 1.0 K/uL   Eosinophils Relative 2 %   Eosinophils Absolute 0.1 0.0 - 0.5 K/uL   Basophils Relative 0 %   Basophils Absolute 0.0 0.0 - 0.1 K/uL   Immature Granulocytes 0 %   Abs Immature Granulocytes  0.02  0.00 - 0.07 K/uL    Comment: Performed at [facility], [address] [place], [place] [address]  Comprehensive metabolic panel     Status: Abnormal   Collection Time: 03/28/19 10:49 PM  Result Value Ref Range   Sodium 139 135 - 145 mmol/L   Potassium 3.8 3.5 - 5.1 mmol/L   Chloride 104 98 - 111 mmol/L   CO2 27 22 - 32 mmol/L   Glucose, Bld 81 70 - 99 mg/dL   BUN 24 (H) 8 - 23 mg/dL   Creatinine, Ser 6.040.92 0.61 - 1.24 mg/dL   Calcium 8.7 (L) 8.9 - 10.3 mg/dL   Total Protein 6.2 (L) 6.5 - 8.1 g/dL   Albumin 3.0 (L) 3.5 - 5.0 g/dL   AST 13 (L) 15 - 41 U/L   ALT 9 0 - 44 U/L   Alkaline Phosphatase 53 38 - 126 U/L   Total Bilirubin 0.8 0.3 - 1.2 mg/dL   GFR calc non Af Amer >60 >60 mL/min   GFR calc Af Amer >60 >60 mL/min   Anion gap 8 5 - 15    Comment: Performed at [facility], [address] [place], [place] [address]  Brain natriuretic peptide     Status: None   Collection Time: 03/28/19 10:49 PM  Result Value Ref Range   B Natriuretic Peptide 88.0 0.0 - 100.0 pg/mL    Comment: Performed at [facility], [address] [place], [place] [address]  Lactic acid, plasma     Status: None   Collection Time: 03/28/19 10:54 PM  Result Value Ref Range   Lactic Acid, Venous 1.0 0.5 - 1.9 mmol/L    Comment: Performed at [facility], [address] [place], [place] [address]  APTT     Status: None   Collection Time: 03/28/19 10:54 PM  Result Value Ref Range   aPTT 35 24 - 36 seconds    Comment: Performed at [facility], [address] [place], [place] [address]  Protime-INR     Status: None   Collection Time: 03/28/19 10:54 PM  Result Value Ref Range   Prothrombin Time 14.3 11.4 - 15.2 seconds   INR 1.1 0.8 - 1.2    Comment: (NOTE) INR goal varies based on device and disease states. Performed at [facility], [address] [place], [place] [address]    Troponin I (High Sensitivity)     Status: None   Collection Time: 03/28/19 10:54 PM  Result Value Ref Range   Troponin I (High Sensitivity) 11 <18 ng/L    Comment: (NOTE) Elevated high sensitivity troponin I (hsTnI) values and significant  changes across serial measurements may suggest ACS but many other  chronic and acute conditions are known to elevate hsTnI results.  Refer to the "Links" section for chest pain algorithms and additional  guidance. Performed at [facility], [address] [place], [place] [address]   Urinalysis, Routine w reflex microscopic     Status: Abnormal   Collection Time: 03/29/19  1:21 AM  Result Value Ref Range   Color, Urine YELLOW YELLOW   APPearance CLEAR CLEAR   Specific Gravity, Urine 1.019 1.005 - 1.030   pH 5.0 5.0 - 8.0   Glucose, UA NEGATIVE NEGATIVE mg/dL   Hgb urine dipstick NEGATIVE NEGATIVE   Bilirubin Urine NEGATIVE NEGATIVE   Ketones, ur NEGATIVE NEGATIVE mg/dL   Protein, ur NEGATIVE NEGATIVE  mg/dL   Nitrite NEGATIVE NEGATIVE   Leukocytes,Ua SMALL (A) NEGATIVE   RBC / HPF 0-5 0 - 5 RBC/hpf   WBC, UA 0-5 0 - 5 WBC/hpf   Bacteria, UA NONE SEEN NONE SEEN   Squamous Epithelial / LPF 0-5 0 - 5   Mucus PRESENT     Comment: Performed at [facility], [address], [place], [place] [address]    Chemistries  Recent Labs  Lab 03/28/19 2249  NA 139  K 3.8  CL 104  CO2 27  GLUCOSE 81  BUN 24*  CREATININE 0.92  CALCIUM 8.7*  AST 13*  ALT 9  ALKPHOS 53  BILITOT 0.8   ------------------------------------------------------------------------------------------------------------------  ------------------------------------------------------------------------------------------------------------------ GFR: Estimated Creatinine Clearance: 53.7 mL/min (by C-G formula based on SCr of 0.92 mg/dL). Liver Function Tests: Recent Labs  Lab 03/28/19 2249  AST 13*  ALT 9  ALKPHOS 53  BILITOT 0.8   PROT 6.2*  ALBUMIN 3.0*   No results for input(s): LIPASE, AMYLASE in the last 168 hours. No results for input(s): AMMONIA in the last 168 hours. Coagulation Profile: Recent Labs  Lab 03/28/19 2254  INR 1.1   Cardiac Enzymes: No results for input(s): CKTOTAL, CKMB, CKMBINDEX, TROPONINI in the last 168 hours. BNP (last 3 results) No results for input(s): PROBNP in the last 8760 hours. HbA1C: No results for input(s): HGBA1C in the last 72 hours. CBG: No results for input(s): GLUCAP in the last 168 hours. Lipid Profile: No results for input(s): CHOL, HDL, LDLCALC, TRIG, CHOLHDL, LDLDIRECT in the last 72 hours. Thyroid Function Tests: No results for input(s): TSH, T4TOTAL, FREET4, T3FREE, THYROIDAB in the last 72 hours. Anemia Panel: No results for input(s): VITAMINB12, FOLATE, FERRITIN, TIBC, IRON, RETICCTPCT in the last 72 hours.  --------------------------------------------------------------------------------------------------------------- Urine analysis:    Component Value Date/Time   COLORURINE YELLOW 03/29/2019 0121   APPEARANCEUR CLEAR 03/29/2019 0121   LABSPEC 1.019 03/29/2019 0121   PHURINE 5.0 03/29/2019 0121   GLUCOSEU NEGATIVE 03/29/2019 0121   HGBUR NEGATIVE 03/29/2019 0121   BILIRUBINUR NEGATIVE 03/29/2019 0121   KETONESUR NEGATIVE 03/29/2019 0121   PROTEINUR NEGATIVE 03/29/2019 0121   UROBILINOGEN 1.0 01/16/2010 0200   NITRITE NEGATIVE 03/29/2019 0121   LEUKOCYTESUR SMALL (A) 03/29/2019 0121      Imaging Results:    Dg Chest Port 1 View  Result Date: 03/28/2019 CLINICAL DATA:  Edema, rales EXAM: PORTABLE CHEST 1 VIEW COMPARISON:  January 10, 2018 FINDINGS: The heart size and mediastinal contours are unchanged. There is a tortuous descending aorta. Elevation of the right hemidiaphragm. The lungs are clear. No focal airspace consolidation or pleural effusion. No acute osseous abnormality. IMPRESSION: No acute cardiopulmonary process. Electronically Signed   By:  Jonna Clark M.D.   On: 03/28/2019 23:57   nsr at 80, lad, RBBB   Assessment & Plan:    Principal Problem:   Cellulitis Active Problems:   Bilateral lower extremity edema   CKD (chronic kidney disease), stage III (HCC)   Essential hypertension, benign   Type II diabetes mellitus with stage 3 chronic kidney disease (HCC)   Dyslipidemia associated with type 2 diabetes mellitus (HCC)   Vitamin B 12 deficiency   Chronic cerebrovascular accident (CVA)  Cellulitis Blood culture x2 vanco iv, rocephin iv   Bilateral lower ext edema Lower ext u/s r/o DVT Lasix 40mg  iv qday  H/o CVA Cont Plavix 75mg  po qday Cont Simvastatin 20mg  po qday  Dm2 w CKD stage3,  Check hga1c,  Fsbs  ac and qhs, ISS  Hypertension Cont Lisinopril 5mg  po qday  BPH Cont Flomax 0.4mg  po qhs  B12 deficiency  Cont vitamin B12  DVT Prophylaxis-   Lovenox - SCDs   AM Labs Ordered, also please review Full Orders  Family Communication: Admission, patients condition and plan of care including tests being ordered have been discussed with the patient  who indicate understanding and agree with the plan and Code Status.  Code Status:  FULL CODE per patient, left message on answering machine for daughter that patient admitted for cellulitis  Admission status: Observation: Based on patients clinical presentation and evaluation of above clinical data, I have made determination that patient meets observation criteria at this time   Depending upon how patient responds to iv abx, may require change to inpatient status  Time spent in minutes : 70 minutes   Pearson GrippeJames Julieann Drummonds M.D on 03/29/2019 at 4:34 AM

## 2019-03-29 NOTE — Progress Notes (Addendum)
ANTICOAGULATION CONSULT NOTE - Initial Consult  Pharmacy Consult for Xarelto Indication: new DVT  No Known Allergies  Patient Measurements: Height: 5\' 8"  (172.7 cm) Weight: 175 lb 12.8 oz (79.7 kg) IBW/kg (Calculated) : 68.4  Vital Signs: Temp: 98.7 F (37.1 C) (09/19 1348) Temp Source: Oral (09/19 1348) BP: 88/63 (09/19 1351) Pulse Rate: 77 (09/19 1351)  Labs: Recent Labs    03/28/19 2249 03/28/19 2254 03/29/19 0449  HGB 11.4*  --  11.3*  HCT 36.2*  --  36.7*  PLT 244  --  249  APTT  --  35  --   LABPROT  --  14.3  --   INR  --  1.1  --   CREATININE 0.92  --  0.84  TROPONINIHS  --  11  --     Estimated Creatinine Clearance: 58.8 mL/min (by C-G formula based on SCr of 0.84 mg/dL).   Medical History: Past Medical History:  Diagnosis Date  . AKI (acute kidney injury) (HCC) 05/18/2017  . Diabetes mellitus without complication (HCC)   . Hyperlipidemia   . Hypertension   . Left-sided weakness   . Stroke [facility](HCC)     Medications:  Medications Prior to Admission  Medication Sig Dispense Refill Last Dose  . clopidogrel (PLAVIX) 75 MG tablet Take 75 mg by mouth daily.  4 03/28/2019 at 0900  . furosemide (LASIX) 40 MG tablet Take 1 tablet (40 mg total) by mouth daily. 30 tablet 4 03/28/2019 at 0900  . lisinopril (PRINIVIL,ZESTRIL) 5 MG tablet Take 5 mg by mouth daily.  4 03/28/2019 at 0900  . polyethylene glycol (MIRALAX / GLYCOLAX) 17 g packet Take 17 g by mouth daily.   03/28/2019 at 0900  . potassium chloride (K-DUR) 10 MEQ tablet Take 10 mEq by mouth daily.   4 03/28/2019 at 0900  . senna (SENOKOT) 8.6 MG tablet Take 1 tablet by mouth 2 (two) times daily.   03/28/2019 at 0900  . tamsulosin (FLOMAX) 0.4 MG CAPS capsule Take 0.4 mg by mouth at bedtime.   03/27/2019 at 2100  . traMADol (ULTRAM) 50 MG tablet Take 1 tablet (50 mg total) by mouth every 6 (six) hours as needed. (Patient taking differently: Take 50 mg by mouth every 6 (six) hours as needed for moderate pain or  severe pain. ) 20 tablet 0 unknown  . vitamin B-12 (CYANOCOBALAMIN) 100 MCG tablet Take 100 mcg by mouth daily.   03/28/2019 at 0900  . guaiFENesin (MUCINEX) 600 MG 12 hr tablet Take 1 tablet (600 mg total) 2 (two) times daily by mouth. (Patient not taking: Reported on 03/29/2019)   Completed Course at Unknown time  . simvastatin (ZOCOR) 20 MG tablet Take 20 mg by mouth daily.  4 03/27/2019 at 2100   Scheduled:  . clopidogrel  75 mg Oral Q breakfast  . feeding supplement (ENSURE ENLIVE)  237 mL Oral BID BM  . furosemide  40 mg Intravenous Daily  . insulin aspart  0-5 Units Subcutaneous QHS  . insulin aspart  0-9 Units Subcutaneous TID WC  . polyethylene glycol  17 g Oral Daily  . Rivaroxaban  15 mg Oral BID WC  . senna  1 tablet Oral BID  . simvastatin  20 mg Oral QHS  . tamsulosin  0.4 mg Oral QHS  . vitamin B-12  100 mcg Oral Daily    Assessment: [age] yoM with PMH CVA, HTN, HLD, found to have LLE DVT. Pharmacy to start Xarelto.  Today, 03/29/2019:  Hgb  low but stable from yesterday, Plt WNL  SCr stable at baseline  No bleeding documented  Also on Plavix for Hx CVA, Lovenox 40 mg daily while admitted   Plan:   Stop ppx-dose Lovenox  Xarelto 15 mg PO bid x 21 days, followed by 20 mg PO daily thereafter  Assume will continue Plavix at this time given PMH but may wish to re-address in setting of concomitant Kimberly to educate patient prior to discharge  Pharmacy will sign off, following peripherally  Luella Gardenhire A 03/29/2019,3:45 PM

## 2019-03-30 ENCOUNTER — Inpatient Hospital Stay (HOSPITAL_COMMUNITY): Payer: Medicare Other

## 2019-03-30 ENCOUNTER — Encounter (HOSPITAL_COMMUNITY): Payer: Self-pay

## 2019-03-30 DIAGNOSIS — I2699 Other pulmonary embolism without acute cor pulmonale: Secondary | ICD-10-CM | POA: Diagnosis present

## 2019-03-30 DIAGNOSIS — I2602 Saddle embolus of pulmonary artery with acute cor pulmonale: Secondary | ICD-10-CM

## 2019-03-30 DIAGNOSIS — I5022 Chronic systolic (congestive) heart failure: Secondary | ICD-10-CM | POA: Diagnosis present

## 2019-03-30 DIAGNOSIS — I82402 Acute embolism and thrombosis of unspecified deep veins of left lower extremity: Secondary | ICD-10-CM | POA: Diagnosis present

## 2019-03-30 HISTORY — DX: Other pulmonary embolism without acute cor pulmonale: I26.99

## 2019-03-30 LAB — COMPREHENSIVE METABOLIC PANEL
ALT: 8 U/L (ref 0–44)
AST: 9 U/L — ABNORMAL LOW (ref 15–41)
Albumin: 2.6 g/dL — ABNORMAL LOW (ref 3.5–5.0)
Alkaline Phosphatase: 46 U/L (ref 38–126)
Anion gap: 8 (ref 5–15)
BUN: 17 mg/dL (ref 8–23)
CO2: 27 mmol/L (ref 22–32)
Calcium: 8.5 mg/dL — ABNORMAL LOW (ref 8.9–10.3)
Chloride: 105 mmol/L (ref 98–111)
Creatinine, Ser: 0.83 mg/dL (ref 0.61–1.24)
GFR calc Af Amer: >60 mL/min (ref >60)
GFR calc non Af Amer: >60 mL/min (ref >60)
Glucose, Bld: 77 mg/dL (ref 70–99)
Potassium: 4 mmol/L (ref 3.5–5.1)
Sodium: 140 mmol/L (ref 135–145)
Total Bilirubin: 0.6 mg/dL (ref 0.3–1.2)
Total Protein: 5.4 g/dL — ABNORMAL LOW (ref 6.5–8.1)

## 2019-03-30 LAB — URINE CULTURE: Culture: 30000 — AB

## 2019-03-30 LAB — ECHOCARDIOGRAM COMPLETE
Height: 68 in
Weight: 3061.75 oz

## 2019-03-30 LAB — CBC
HCT: 32.5 % — ABNORMAL LOW (ref 39.0–52.0)
Hemoglobin: 10.3 g/dL — ABNORMAL LOW (ref 13.0–17.0)
MCH: 27.7 pg (ref 26.0–34.0)
MCHC: 31.7 g/dL (ref 30.0–36.0)
MCV: 87.4 fL (ref 80.0–100.0)
Platelets: 220 10*3/uL (ref 150–400)
RBC: 3.72 MIL/uL — ABNORMAL LOW (ref 4.22–5.81)
RDW: 15 % (ref 11.5–15.5)
WBC: 7.5 10*3/uL (ref 4.0–10.5)
nRBC: 0 % (ref 0.0–0.2)

## 2019-03-30 LAB — GLUCOSE, CAPILLARY
Glucose-Capillary: 71 mg/dL (ref 70–99)
Glucose-Capillary: 99 mg/dL (ref 70–99)
Glucose-Capillary: 87 mg/dL (ref 70–99)
Glucose-Capillary: 94 mg/dL (ref 70–99)

## 2019-03-30 MED ORDER — BISACODYL 10 MG RE SUPP
10.0000 mg | Freq: Every day | RECTAL | Status: DC
  Administered 2019-03-31 – 2019-04-01 (×2): 10 mg via RECTAL
  Filled 2019-03-30 (×2): qty 1

## 2019-03-30 MED ORDER — SODIUM CHLORIDE 0.9 % IV SOLN
INTRAVENOUS | Status: DC
  Administered 2019-03-30: 08:00:00 via INTRAVENOUS

## 2019-03-30 MED ORDER — SODIUM CHLORIDE (PF) 0.9 % IJ SOLN
INTRAMUSCULAR | Status: AC
  Filled 2019-03-30: qty 50

## 2019-03-30 MED ORDER — HYDROCERIN EX CREA
TOPICAL_CREAM | Freq: Two times a day (BID) | CUTANEOUS | Status: DC
  Administered 2019-03-30: 15:00:00 via TOPICAL
  Administered 2019-03-31: 1 via TOPICAL
  Administered 2019-04-01: 10:00:00 via TOPICAL
  Filled 2019-03-30: qty 113

## 2019-03-30 MED ORDER — IOHEXOL 350 MG/ML SOLN
80.0000 mL | Freq: Once | INTRAVENOUS | Status: AC | PRN
  Administered 2019-03-30: 80 mL via INTRAVENOUS

## 2019-03-30 MED ORDER — RIVAROXABAN 15 MG PO TABS
15.0000 mg | ORAL_TABLET | Freq: Two times a day (BID) | ORAL | Status: DC
  Administered 2019-03-30 – 2019-04-01 (×4): 15 mg via ORAL
  Filled 2019-03-30 (×4): qty 1

## 2019-03-30 MED ORDER — SODIUM CHLORIDE 0.9 % IV BOLUS
500.0000 mL | Freq: Once | INTRAVENOUS | Status: AC
  Administered 2019-03-30: 500 mL via INTRAVENOUS

## 2019-03-30 NOTE — Discharge Instructions (Signed)
Information on my medicine - XARELTO (rivaroxaban)  This medication education was reviewed with me or my healthcare representative as part of my discharge preparation.  The pharmacist that spoke with me during my hospital stay was:  Raiya Stainback A, Lena? Xarelto was prescribed to treat blood clots that may have been found in the veins of your legs (deep vein thrombosis) or in your lungs (pulmonary embolism) and to reduce the risk of them occurring again.  What do you need to know about Xarelto? The starting dose is one 15 mg tablet taken TWICE daily with food for the FIRST 21 DAYS then on (enter date)  04/19/2019  the dose is changed to one 20 mg tablet taken ONCE A DAY with your evening meal.  DO NOT stop taking Xarelto without talking to the health care provider who prescribed the medication.  Refill your prescription for 20 mg tablets before you run out.  After discharge, you should have regular check-up appointments with your healthcare provider that is prescribing your Xarelto.  In the future your dose may need to be changed if your kidney function changes by a significant amount.  What do you do if you miss a dose? If you are taking Xarelto TWICE DAILY and you miss a dose, take it as soon as you remember. You may take two 15 mg tablets (total 30 mg) at the same time then resume your regularly scheduled 15 mg twice daily the next day.  If you are taking Xarelto ONCE DAILY and you miss a dose, take it as soon as you remember on the same day then continue your regularly scheduled once daily regimen the next day. Do not take two doses of Xarelto at the same time.   Important Safety Information Xarelto is a blood thinner medicine that can cause bleeding. You should call your healthcare provider right away if you experience any of the following: ? Bleeding from an injury or your nose that does not stop. ? Unusual colored urine (red or dark brown) or  unusual colored stools (red or black). ? Unusual bruising for unknown reasons. ? A serious fall or if you hit your head (even if there is no bleeding).  Some medicines may interact with Xarelto and might increase your risk of bleeding while on Xarelto. To help avoid this, consult your healthcare provider or pharmacist prior to using any new prescription or non-prescription medications, including herbals, vitamins, non-steroidal anti-inflammatory drugs (NSAIDs) and supplements.  This website has more information on Xarelto: [email or web address]

## 2019-03-30 NOTE — Progress Notes (Addendum)
Patient BP overnight running low. Patient says he feels dizzy at times. No other complaints. MEWS remain green. On call notified. Will continue to monitor. Today's Vitals   03/29/19 2102 03/29/19 2148 03/30/19 0110 03/30/19 0542  BP: 91/64  (!) 91/57 (!) 84/52  Pulse: 69  66 67  Resp: 20   19  Temp: 98.3 F (36.8 C)   98.1 F (36.7 C)  TempSrc: Oral     SpO2: 98%  98% 98%  Weight:      Height:      PainSc:  0-No pain     Body mass index is 26.73 kg/m.

## 2019-03-30 NOTE — Progress Notes (Signed)
Dressing change done to bilateral lower extremities. Both extremities washed with soap and water, patted dry and eucerin cream applied. Vaseline gauze applied to opened areas on LLE and both extremities wrapped with kerlix and ACE bandages. Pt tolerated dressing change with no problems.  VWilliams,RN.

## 2019-03-30 NOTE — Consult Note (Signed)
[facility] Nurse wound consult note Reason for Consult: Bilateral edema, erythema. Scattered areas of linear breaks in skin with serous weeping. Patient is + for left DVT and has been started on anticoagulant therapy (Xarelto). Wound type: Venous insufficiency.  Left DVT Pressure Injury POA: N/A Measurement:N/A Wound bed: pink, moist (scattered linear) Drainage (amount, consistency, odor) serous Periwound: dry, scaly, hemosiderin staining Dressing procedure/placement/frequency: I will add twice daily cleansing, application of a moisturizer with low likelihood of sensitization, covering of open areas with white petrolatum gauze, wrapping of LEs from just below toes to just below knees with Kerlix roll gauze topped with wrapping in a similar manner with ACE bandages for very light compression (~12-13 mmHG). LEs are to be elevated, heels floated to avoid pressure injury.  [facility] nursing team will not follow, but will remain available to this patient, the nursing and medical teams.  Please re-consult if needed. Thanks, Maudie Flakes, MSN, RN, [place], [facility]  Pager# [phone]

## 2019-03-30 NOTE — Progress Notes (Signed)
PROGRESS NOTE                                                                                                                                                                                                             Patient Demographics:    Mark Bullock, is a 85 y.o. male, DOB - 04/20/1929, DXI:[id number]  Admit date - 03/28/2019   Admitting Physician Jani Gravel, MD  Outpatient Primary MD for the patient is Lorene Dy, MD  LOS - 1  Outpatient Specialists: none  Chief Complaint  Patient presents with   Leg Swelling       Brief Narrative 85 year old male resident of assisted living with history of diabetes mellitus, history of stroke with left-sided weakness, hypertension, hyperlipidemia presented with progressive worsening bilateral leg swellings (L >R) of unknown duration with some pain.  Patient on Lasix and as per the facility who told EMS that patient has been noncompliant with his Lasix.  Patient is a poor historian.  Denies any fevers or chills.  Patient noted to have increased redness and warmth over the lower extremities (L >R). In the ED he was afebrile.  Blood pressure soft (99/61 mmHg).  Blood work showed normal WBC, mildly elevated BUN of 24 and normal creatinine. Chest x-ray without acute findings.  UA unremarkable. Admitted for significant cellulitis requiring IV antibiotics. Patient noted to have left lower extremity DVT followed by CT angiogram showing subsegmental PE.    Subjective:   Denies any pain in the legs.  Denies any shortness of breath or chest discomfort.  Noted for systolic blood pressure in the 80s.  Assessment  & Plan :    Principal Problem: Left lower extremity DVT and subsegmental pulmonary embolism Started on oral Xarelto.  Has soft blood pressure but I suspect his baseline systolic blood pressure is low.  Will check 2D echo.  No signs of right heart strain on CT chest.   Monitor on telemetry.  Needs at least 6 months of anticoagulation.    Cellulitis of bilateral lower extremity (L >R) Has bilateral leg swellings with what appears chronic skin changes but with skin breakdown.  Switch vancomycin and Rocephin to IV Ancef.  Minimal improvement from yesterday.    Active Problems: Chronic systolic CHF with bilateral lower extremity edema. Doppler lower extremity showed DVT.  Held Lasix given his soft  blood pressure. Check 2D echo.  Patient is hypovolemic.  Placed on gentle hydration.  History of CVA with residual left-sided weakness Continue Plavix and statin.  Reports ambulating with walker.  Hypertension Hold lisinopril and Lasix.  Order gentle hydration.  BPH Continue Flomax  B12 deficiency Continue supplement   Type 2 diabetes mellitus with CKD stage III Stable with A1c of 5.5.  Monitor on sliding scale coverage  Hypokalemia Replenished      Code Status : Full code  Family Communication  : None  Disposition Plan  : Return to ALF possibly in the next 48 hours if symptoms improve  Barriers For Discharge : Active symptoms  Consults  : None  Procedures  : Doppler lower extremity, CT angiogram chest, 2D echo  DVT Prophylaxis  : Xarelto Lab Results  Component Value Date   PLT 220 03/30/2019    Antibiotics  :   Anti-infectives (From admission, onward)   Start     Dose/Rate Route Frequency Ordered Stop   03/30/19 2100  cefTRIAXone (ROCEPHIN) 1 g in sodium chloride 0.9 % 100 mL IVPB     1 g 200 mL/hr over 30 Minutes Intravenous Every 24 hours 03/29/19 0431     03/30/19 0800  vancomycin (VANCOCIN) 1,250 mg in sodium chloride 0.9 % 250 mL IVPB     1,250 mg 166.7 mL/hr over 90 Minutes Intravenous Every 24 hours 03/29/19 0509     03/29/19 0445  vancomycin (VANCOCIN) 1,750 mg in sodium chloride 0.9 % 500 mL IVPB     1,750 mg 250 mL/hr over 120 Minutes Intravenous  Once 03/29/19 0435 03/29/19 0731   03/28/19 2300  cefTRIAXone  (ROCEPHIN) 2 g in sodium chloride 0.9 % 100 mL IVPB  Status:  Discontinued     2 g 200 mL/hr over 30 Minutes Intravenous Every 24 hours 03/28/19 2256 03/29/19 0503        Objective:   Vitals:   03/29/19 2102 03/30/19 0110 03/30/19 0542 03/30/19 0656  BP: 91/64 (!) 91/57 (!) 84/52 (!) 86/55  Pulse: 69 66 67 71  Resp: 20  19   Temp: 98.3 F (36.8 C)  98.1 F (36.7 C)   TempSrc: Oral     SpO2: 98% 98% 98% 98%  Weight:   86.8 kg   Height:        Wt Readings from Last 3 Encounters:  03/30/19 86.8 kg  01/18/18 103.4 kg  07/11/17 99.9 kg     Intake/Output Summary (Last 24 hours) at 03/30/2019 1058 Last data filed at 03/30/2019 0619 Gross per 24 hour  Intake 0 ml  Output 1250 ml  Net -1250 ml   Physical exam Not in distress HEENT: Moist mucosa, supple neck Chest: Clear to auscultation bilaterally CVs: Normal S1-S2, no murmurs GI: Soft, nondistended, nontender Musculoskeletal: Bilateral lower extremity erythema and leg edema (L >R), minimal warmth but no tenderness       Data Review:    CBC Recent Labs  Lab 03/28/19 2249 03/29/19 0449 03/30/19 0614  WBC 7.9 7.7 7.5  HGB 11.4* 11.3* 10.3*  HCT 36.2* 36.7* 32.5*  PLT 244 249 220  MCV 89.4 90.0 87.4  MCH 28.1 27.7 27.7  MCHC 31.5 30.8 31.7  RDW 15.1 14.9 15.0  LYMPHSABS 1.5  --   --   MONOABS 0.7  --   --   EOSABS 0.1  --   --   BASOSABS 0.0  --   --     Chemistries  Recent Labs  Lab 03/28/19 2249 03/29/19 0449 03/30/19 0614  NA 139 141 140  K 3.8 3.2* 4.0  CL 104 105 105  CO2 27 28 27   GLUCOSE 81 84 77  BUN 24* 23 17  CREATININE 0.92 0.84 0.83  CALCIUM 8.7* 8.5* 8.5*  AST 13* 13* 9*  ALT 9 7 8   ALKPHOS 53 52 46  BILITOT 0.8 1.0 0.6   ------------------------------------------------------------------------------------------------------------------ No results for input(s): CHOL, HDL, LDLCALC, TRIG, CHOLHDL, LDLDIRECT in the last 72 hours.  Lab Results  Component Value Date   HGBA1C 5.5  03/29/2019   ------------------------------------------------------------------------------------------------------------------ No results for input(s): TSH, T4TOTAL, T3FREE, THYROIDAB in the last 72 hours.  Invalid input(s): FREET3 ------------------------------------------------------------------------------------------------------------------ No results for input(s): VITAMINB12, FOLATE, FERRITIN, TIBC, IRON, RETICCTPCT in the last 72 hours.  Coagulation profile Recent Labs  Lab 03/28/19 2254  INR 1.1    No results for input(s): DDIMER in the last 72 hours.  Cardiac Enzymes No results for input(s): CKMB, TROPONINI, MYOGLOBIN in the last 168 hours.  Invalid input(s): CK ------------------------------------------------------------------------------------------------------------------    Component Value Date/Time   BNP 88.0 03/28/2019 2249    Inpatient Medications  Scheduled Meds:  bisacodyl  10 mg Rectal Daily   clopidogrel  75 mg Oral Q breakfast   feeding supplement (ENSURE ENLIVE)  237 mL Oral BID BM   insulin aspart  0-5 Units Subcutaneous QHS   insulin aspart  0-9 Units Subcutaneous TID WC   polyethylene glycol  17 g Oral Daily   senna  1 tablet Oral BID   simvastatin  20 mg Oral QHS   sodium chloride (PF)       tamsulosin  0.4 mg Oral QHS   vitamin B-12  100 mcg Oral Daily   Continuous Infusions:  sodium chloride     sodium chloride 75 mL/hr at 03/30/19 0813   cefTRIAXone (ROCEPHIN)  IV     vancomycin 1,250 mg (03/30/19 0816)   PRN Meds:.sodium chloride, acetaminophen **OR** acetaminophen, sodium chloride flush, traMADol  Micro Results Recent Results (from the past 240 hour(s))  Urine culture     Status: Abnormal   Collection Time: 03/29/19  1:21 AM   Specimen: In/Out Cath Urine  Result Value Ref Range Status   Specimen Description   Final    IN/OUT CATH URINE Performed at [facility], [address]  [place], [place] [address]    Special Requests   Final    NONE Performed at [facility], [address] [place], [place] [address]    Culture (A)  Final    30,000 COLONIES/mL MULTIPLE SPECIES PRESENT, SUGGEST RECOLLECTION   Report Status 03/30/2019 FINAL  Final  SARS CORONAVIRUS 2 (TAT 6-24 HRS) Nasopharyngeal Nasopharyngeal Swab     Status: None   Collection Time: 03/29/19  2:32 AM   Specimen: Nasopharyngeal Swab  Result Value Ref Range Status   SARS Coronavirus 2 NEGATIVE NEGATIVE Final    Comment: (NOTE) SARS-CoV-2 target nucleic acids are NOT DETECTED. The SARS-CoV-2 RNA is generally detectable in upper and lower respiratory specimens during the acute phase of infection. Negative results do not preclude SARS-CoV-2 infection, do not rule out co-infections with other pathogens, and should not be used as the sole basis for treatment or other patient management decisions. Negative results must be combined with clinical observations, patient history, and epidemiological information. The expected result is Negative. Fact Sheet for Patients: HairSlick.nohttps://www.fda.gov/media/138098/download Fact Sheet for Healthcare Providers: quierodirigir.comhttps://www.fda.gov/media/138095/download This test is not yet approved or cleared by  the [employer] [place] FDA and  has been authorized for detection and/or diagnosis of SARS-CoV-2 by FDA under an Emergency Use Authorization (EUA). This EUA will remain  in effect (meaning this test can be used) for the duration of the COVID-19 declaration under Section 56 4(b)(1) of the Act, 21 U.S.C. section 360bbb-3(b)(1), unless the authorization is terminated or revoked sooner. Performed at [facility], [address] [place], [place] [address]   MRSA PCR Screening     Status: None   Collection Time: 03/29/19  4:00 AM   Specimen: Nasal Mucosa; Nasopharyngeal  Result Value Ref Range Status   MRSA by PCR NEGATIVE NEGATIVE Final    Comment:        The  GeneXpert MRSA Assay (FDA approved for NASAL specimens only), is one component of a comprehensive MRSA colonization surveillance program. It is not intended to diagnose MRSA infection nor to guide or monitor treatment for MRSA infections. Performed at [facility], [address] [place], [place] [address]     Radiology Reports Ct Angio Chest Pe W Or Wo Contrast  Result Date: 03/30/2019 CLINICAL DATA:  Lower extremity DVT, high probability pulmonary embolism EXAM: CT ANGIOGRAPHY CHEST WITH CONTRAST TECHNIQUE: Multidetector CT imaging of the chest was performed using the standard protocol during bolus administration of intravenous contrast. Multiplanar CT image reconstructions and MIPs were obtained to evaluate the vascular anatomy. CONTRAST:  80mL OMNIPAQUE IOHEXOL 350 MG/ML SOLN COMPARISON:  CT abdomen 11/21/2018 FINDINGS: Cardiovascular: Heart size normal. No pericardial effusion. Dilated central pulmonary arteries. No central or saddle emboli. Segmental embolus in a right upper lobe pulmonary arterial branch image 100/5. Nonocclusive thrombus in a distal right lower lobe pulmonary arterial branch image 149/5. Motion degrades some images through the left lung base. Fair contrast opacification of the thoracic aorta. No dissection or stenosis. Dilatation of the ascending segment up to 4.2 cm, arch 3.5 cm. Mediastinum/Nodes: No hilar or mediastinal adenopathy. Lungs/Pleura: Trace right pleural effusion. No pneumothorax. Minimal dependent atelectasis posteriorly in the right lower lobe. Lungs otherwise clear. Upper Abdomen: Small low-attenuation hepatic lesions as before, presumably cysts in the absence of a history of primary carcinoma. Partially calcified 2.1 cm stone in the nondilated gallbladder. Bilateral low-attenuation renal lesions stable, probably possibly cysts but incompletely characterized. No acute findings. Musculoskeletal: No chest wall abnormality. No acute or  significant osseous findings. Review of the MIP images confirms the above findings. IMPRESSION: 1. POSITIVE for at least 2 right segmental/subsegmental pulmonary emboli. 2. Trace right pleural effusion. 3. Dilated central pulmonary arteries suggesting pulmonary hypertension. 4. Thoracic aortic aneurysm without complicating features. Consider annual imaging followup by CTA or MRA. This recommendation follows 2010 ACCF/AHA/AATS/ACR/ASA/SCA/SCAI/SIR/STS/SVM Guidelines for the Diagnosis and Management of Patients with Thoracic Aortic Disease. Circulation.2010; 121: W295-A213: [id number] Electronically Signed   By: Corlis Leak  Hassell M.D.   On: 03/30/2019 10:23   Dg Chest Port 1 View  Result Date: 03/28/2019 CLINICAL DATA:  Edema, rales EXAM: PORTABLE CHEST 1 VIEW COMPARISON:  January 10, 2018 FINDINGS: The heart size and mediastinal contours are unchanged. There is a tortuous descending aorta. Elevation of the right hemidiaphragm. The lungs are clear. No focal airspace consolidation or pleural effusion. No acute osseous abnormality. IMPRESSION: No acute cardiopulmonary process. Electronically Signed   By: Jonna ClarkBindu  Avutu M.D.   On: 03/28/2019 23:57   Vas Koreas Lower Extremity Venous (dvt)  Result Date: 03/29/2019  Lower Venous Study Indications: Bilateral edema and blistering of lower legs.  Comparison Study: 01/21/18 negative Performing  Technologist: Jeb Levering RDMS, RVT  Examination Guidelines: A complete evaluation includes B-mode imaging, spectral Doppler, color Doppler, and power Doppler as needed of all accessible portions of each vessel. Bilateral testing is considered an integral part of a complete examination. Limited examinations for reoccurring indications may be performed as noted.  +---------+---------------+---------+-----------+----------+--------------+  RIGHT     Compressibility Phasicity Spontaneity Properties Thrombus Aging  +---------+---------------+---------+-----------+----------+--------------+  CFV       Full                                                              +---------+---------------+---------+-----------+----------+--------------+  SFJ       Full                                                             +---------+---------------+---------+-----------+----------+--------------+  FV Prox   Full                                                             +---------+---------------+---------+-----------+----------+--------------+  FV Mid    Full                                                             +---------+---------------+---------+-----------+----------+--------------+  FV Distal Full                                                             +---------+---------------+---------+-----------+----------+--------------+  PFV       Full                                                             +---------+---------------+---------+-----------+----------+--------------+  POP                       Yes       Yes                                    +---------+---------------+---------+-----------+----------+--------------+  PTV       Full                                                             +---------+---------------+---------+-----------+----------+--------------+  PERO      Full                                                             +---------+---------------+---------+-----------+----------+--------------+   +--------------+---------------+---------+-----------+----------+--------------+  LEFT           Compressibility Phasicity Spontaneity Properties Thrombus Aging  +--------------+---------------+---------+-----------+----------+--------------+  CFV            None            No        No                                     +--------------+---------------+---------+-----------+----------+--------------+  SFJ            None                                                             +--------------+---------------+---------+-----------+----------+--------------+  FV Prox        Full                                                              +--------------+---------------+---------+-----------+----------+--------------+  FV Mid         Full                                                             +--------------+---------------+---------+-----------+----------+--------------+  FV Distal      Full                                                             +--------------+---------------+---------+-----------+----------+--------------+  PFV            None                                                             +--------------+---------------+---------+-----------+----------+--------------+  POP            Full            Yes       Yes                                    +--------------+---------------+---------+-----------+----------+--------------+  PTV  Full                                                             +--------------+---------------+---------+-----------+----------+--------------+  PERO           Full                                                             +--------------+---------------+---------+-----------+----------+--------------+  Iliac mid-dist Full            Yes       Yes                                    +--------------+---------------+---------+-----------+----------+--------------+     Summary: Right: There is no evidence of deep vein thrombosis in the lower extremity. No cystic structure found in the popliteal fossa. Ultrasound characteristics of enlarged lymph nodes are noted in the groin. Left: Findings consistent with acute deep vein thrombosis involving the left common femoral vein, and left proximal profunda vein. No cystic structure found in the popliteal fossa.  *See table(s) above for measurements and observations.    Preliminary     Time Spent in minutes  35   Malaka Ruffner M.D on 03/30/2019 at 10:58 AM  Between 7am to 7pm - Pager - [phone]  After 7pm go to [email or web address] - password [facility]  [facility] -  Office   [phone]

## 2019-03-30 NOTE — Progress Notes (Signed)
Dr. Clementeen Graham notified that CT results were back in Epic for reviewing after receiving call from Radiology staff. VWilliams,RN.

## 2019-03-30 NOTE — Progress Notes (Signed)
  Echocardiogram 2D Echocardiogram has been performed.  Mark Bullock 03/30/2019, 2:23 PM

## 2019-03-31 DIAGNOSIS — I5022 Chronic systolic (congestive) heart failure: Secondary | ICD-10-CM

## 2019-03-31 LAB — CBC
HCT: 39.9 % (ref 39.0–52.0)
Hemoglobin: 12.4 g/dL — ABNORMAL LOW (ref 13.0–17.0)
MCH: 27.9 pg (ref 26.0–34.0)
MCHC: 31.1 g/dL (ref 30.0–36.0)
MCV: 89.7 fL (ref 80.0–100.0)
Platelets: 253 10*3/uL (ref 150–400)
RBC: 4.45 MIL/uL (ref 4.22–5.81)
RDW: 15 % (ref 11.5–15.5)
WBC: 8.3 10*3/uL (ref 4.0–10.5)
nRBC: 0 % (ref 0.0–0.2)

## 2019-03-31 LAB — GLUCOSE, CAPILLARY
Glucose-Capillary: 75 mg/dL (ref 70–99)
Glucose-Capillary: 86 mg/dL (ref 70–99)
Glucose-Capillary: 111 mg/dL — ABNORMAL HIGH (ref 70–99)
Glucose-Capillary: 135 mg/dL — ABNORMAL HIGH (ref 70–99)

## 2019-03-31 MED ORDER — CEFAZOLIN SODIUM-DEXTROSE 1-4 GM/50ML-% IV SOLN
1.0000 g | Freq: Three times a day (TID) | INTRAVENOUS | Status: DC
  Administered 2019-03-31 – 2019-04-01 (×4): 1 g via INTRAVENOUS
  Filled 2019-03-31 (×6): qty 50

## 2019-03-31 NOTE — Evaluation (Signed)
Physical Therapy Evaluation Patient Details Name: Mark Bullock MRN: [id number] DOB: 24-Sep-1928 Today's Date: 03/31/2019   History of Present Illness  85 year old male resident of assisted living with history of diabetes mellitus, stroke with left-sided weakness, hypertension, hyperlipidemia and admitted with LE cellulitis, L LE DVT and PE  Clinical Impression  Pt admitted with above diagnosis. Pt currently with functional limitations due to the deficits listed below (see PT Problem List). Pt will benefit from skilled PT to increase their independence and safety with mobility to allow discharge to the venue listed below.  Pt assisted with donning shoes for ambulation and able to ambulate short distance in hallway with RW.  Pt attempting to be as independent as possible and min/guard provided for safety. Recommend HHPT upon return to ALF if pt agreeable.     Follow Up Recommendations Home health PT    Equipment Recommendations  None recommended by PT    Recommendations for Other Services       Precautions / Restrictions Precautions Precautions: Fall      Mobility  Bed Mobility Overal bed mobility: Needs Assistance Bed Mobility: Supine to Sit;Sit to Supine     Supine to sit: Min guard;HOB elevated Sit to supine: Min guard;HOB elevated   General bed mobility comments: requires increased time however no physical assist  Transfers Overall transfer level: Needs assistance Equipment used: Rolling walker (2 wheeled) Transfers: Sit to/from Stand Sit to Stand: Min guard         General transfer comment: increased time, pt particular and performs his own way (did not appear open to cues so none provided), shoes donned prior to standing and doffed upon sitting  Ambulation/Gait Ambulation/Gait assistance: Min guard Gait Distance (Feet): 60 Feet Assistive device: Rolling walker (2 wheeled) Gait Pattern/deviations: Step-through pattern;Decreased stride length     General  Gait Details: slow but steady with RW  Stairs            Wheelchair Mobility    Modified Rankin (Stroke Patients Only)       Balance                                             Pertinent Vitals/Pain Pain Assessment: No/denies pain    Home Living Family/patient expects to be discharged to:: Assisted living               Home Equipment: Walker - 4 wheels      Prior Function Level of Independence: Independent with assistive device(s)               Hand Dominance        Extremity/Trunk Assessment        Lower Extremity Assessment Lower Extremity Assessment: Generalized weakness(lower legs wrapped in ace bandages, L DVT, venous insufficiency per [facility])       Communication   Communication: No difficulties  Cognition Arousal/Alertness: Awake/alert Behavior During Therapy: WFL for tasks assessed/performed Overall Cognitive Status: Within Functional Limits for tasks assessed                                        General Comments      Exercises     Assessment/Plan    PT Assessment Patient needs continued PT services  PT Problem List Decreased strength;Decreased  mobility;Decreased activity tolerance;Decreased knowledge of use of DME;Decreased skin integrity       PT Treatment Interventions DME instruction;Gait training;Balance training;Therapeutic exercise;Functional mobility training;Therapeutic activities;Patient/family education    PT Goals (Current goals can be found in the Care Plan section)  Acute Rehab PT Goals PT Goal Formulation: With patient Time For Goal Achievement: 04/07/19 Potential to Achieve Goals: Good    Frequency Min 2X/week   Barriers to discharge        Co-evaluation               AM-PAC PT "6 Clicks" Mobility  Outcome Measure Help needed turning from your back to your side while in a flat bed without using bedrails?: A Little Help needed moving from lying on your back  to sitting on the side of a flat bed without using bedrails?: A Little Help needed moving to and from a bed to a chair (including a wheelchair)?: A Little Help needed standing up from a chair using your arms (Bullock.g., wheelchair or bedside chair)?: A Little Help needed to walk in hospital room?: A Little Help needed climbing 3-5 steps with a railing? : A Lot 6 Click Score: 17    End of Session Equipment Utilized During Treatment: Gait belt Activity Tolerance: Patient tolerated treatment well Patient left: in bed;with bed alarm set;with call bell/phone within reach Nurse Communication: Mobility status PT Visit Diagnosis: Other abnormalities of gait and mobility (R26.89)    Time: [id number] PT Time Calculation (min) (ACUTE ONLY): 29 min   Charges:   PT Evaluation $PT Eval Low Complexity: 1 Low PT Treatments $Gait Training: 8-22 mins       Mark Bullock, PT, DPT Acute Rehabilitation Services Office: [phone] Pager: [phone]  Mark Bullock,Mark Bullock 03/31/2019, 2:56 PM

## 2019-03-31 NOTE — Progress Notes (Signed)
PROGRESS NOTE                                                                                                                                                                                                             Patient Demographics:    Mark Bullock, is a 85 y.o. male, DOB - 03-15-1929, ZOX:[id number]  Admit date - 03/28/2019   Admitting Physician Pearson Grippe, MD  Outpatient Primary MD for the patient is Burton Apley, MD  LOS - 2  Outpatient Specialists: none  Chief Complaint  Patient presents with   Leg Swelling       Brief Narrative 85 year old male resident of assisted living with history of diabetes mellitus, history of stroke with left-sided weakness, hypertension, hyperlipidemia presented with progressive worsening bilateral leg swellings (L >R) of unknown duration with some pain.  Patient on Lasix and as per the facility who told EMS that patient has been noncompliant with his Lasix.  Patient is a poor historian.  Denies any fevers or chills.  Patient noted to have increased redness and warmth over the lower extremities (L >R). In the ED he was afebrile.  Blood pressure soft (99/61 mmHg).  Blood work showed normal WBC, mildly elevated BUN of 24 and normal creatinine. Chest x-ray without acute findings.  UA unremarkable. Admitted for significant cellulitis requiring IV antibiotics. Patient noted to have left lower extremity DVT followed by CT angiogram showing subsegmental PE.    Subjective:   Patient denies pain in the legs.  Denies any shortness of breath or chest discomfort.  Assessment  & Plan :    Principal Problem: Left lower extremity DVT and subsegmental pulmonary embolism Started on oral Xarelto.  Had soft blood pressure which improved with gentle hydration. 2D echo showed EF of 35-40% with no right heart strain (previous echo showed EF of 45%) Patient will need at least 6 months of  anticoagulation.    Cellulitis of bilateral lower extremity (L >R) with serous weeping Has bilateral leg swellings with what appears chronic skin changes but with skin breakdown.  Switch antibiotics to IV Ancef.  Wound care consult appreciated for linear breaks in skin with serous weeping.  Recommends twice daily cleansing, moisturizer and wrap the legs with Kerlix roll and Ace bandage.    Active Problems: Chronic systolic CHF with bilateral lower extremity edema. Worsened  EF on 2D echo (35 and 40%) without right heart strain, impaired LV relaxation.  Euvolemic. Lasix held due to low blood pressure.  Also on lisinopril at home which is held.  Will add them back once stable along with low-dose beta-blocker Monitor I/O.  History of CVA with residual left-sided weakness Continue Plavix and statin.  Reports ambulating with walker.  ED evaluation.  Hypertension Lisinopril Lasix held due to low blood pressure.  Now improving with gentle hydration.  BPH Continue Flomax  B12 deficiency Continue supplement   Type 2 diabetes mellitus with CKD stage III Stable with A1c of 5.5.  Monitor on sliding scale coverage  Hypokalemia Replenished      Code Status : Full code  Family Communication  : None  Disposition Plan  : Return to ALF versus SNF based on PT evaluation possible in the next 24 hours if improving (improved cellulitis and blood pressure  Barriers For Discharge : Active symptoms  Consults  : None  Procedures  : Doppler lower extremity, CT angiogram chest, 2D echo  DVT Prophylaxis  : Xarelto Lab Results  Component Value Date   PLT 253 03/31/2019    Antibiotics  :   Anti-infectives (From admission, onward)   Start     Dose/Rate Route Frequency Ordered Stop   03/31/19 1000  ceFAZolin (ANCEF) IVPB 1 g/50 mL premix     1 g 100 mL/hr over 30 Minutes Intravenous Every 8 hours 03/31/19 0802     03/30/19 2100  cefTRIAXone (ROCEPHIN) 1 g in sodium chloride 0.9 % 100 mL  IVPB  Status:  Discontinued     1 g 200 mL/hr over 30 Minutes Intravenous Every 24 hours 03/29/19 0431 03/31/19 0802   03/30/19 0800  vancomycin (VANCOCIN) 1,250 mg in sodium chloride 0.9 % 250 mL IVPB  Status:  Discontinued     1,250 mg 166.7 mL/hr over 90 Minutes Intravenous Every 24 hours 03/29/19 0509 03/31/19 0802   03/29/19 0445  vancomycin (VANCOCIN) 1,750 mg in sodium chloride 0.9 % 500 mL IVPB     1,750 mg 250 mL/hr over 120 Minutes Intravenous  Once 03/29/19 0435 03/29/19 0731   03/28/19 2300  cefTRIAXone (ROCEPHIN) 2 g in sodium chloride 0.9 % 100 mL IVPB  Status:  Discontinued     2 g 200 mL/hr over 30 Minutes Intravenous Every 24 hours 03/28/19 2256 03/29/19 0503        Objective:   Vitals:   03/30/19 0656 03/30/19 1422 03/30/19 2143 03/31/19 0535  BP: (!) 86/55 93/64 107/67 129/73  Pulse: 71 (!) 58 62 (!) 58  Resp:  16 18 20   Temp:   98.4 F (36.9 C) 97.6 F (36.4 C)  TempSrc:    Oral  SpO2: 98% 98% 98% 98%  Weight:    83.6 kg  Height:        Wt Readings from Last 3 Encounters:  03/31/19 83.6 kg  01/18/18 103.4 kg  07/11/17 99.9 kg     Intake/Output Summary (Last 24 hours) at 03/31/2019 1147 Last data filed at 03/31/2019 0300 Gross per 24 hour  Intake 1594.35 ml  Output 450 ml  Net 1144.35 ml   Physical exam Elderly male not in distress HEENT: Moist mucosa, supple neck Chest: Clear CVs: Normal S1-S2 GI: Soft, nondistended, nontender Musculoskeletal: Dressing with Ace wrap over bilateral legs        Data Review:    CBC Recent Labs  Lab 03/28/19 2249 03/29/19 0449 03/30/19 [id number] 03/31/19 0554  WBC 7.9 7.7 7.5 8.3  HGB 11.4* 11.3* 10.3* 12.4*  HCT 36.2* 36.7* 32.5* 39.9  PLT 244 249 220 253  MCV 89.4 90.0 87.4 89.7  MCH 28.1 27.7 27.7 27.9  MCHC 31.5 30.8 31.7 31.1  RDW 15.1 14.9 15.0 15.0  LYMPHSABS 1.5  --   --   --   MONOABS 0.7  --   --   --   EOSABS 0.1  --   --   --   BASOSABS 0.0  --   --   --     Chemistries  Recent  Labs  Lab 03/28/19 2249 03/29/19 0449 03/30/19 0614  NA 139 141 140  K 3.8 3.2* 4.0  CL 104 105 105  CO2 27 28 27   GLUCOSE 81 84 77  BUN 24* 23 17  CREATININE 0.92 0.84 0.83  CALCIUM 8.7* 8.5* 8.5*  AST 13* 13* 9*  ALT 9 7 8   ALKPHOS 53 52 46  BILITOT 0.8 1.0 0.6   ------------------------------------------------------------------------------------------------------------------ No results for input(s): CHOL, HDL, LDLCALC, TRIG, CHOLHDL, LDLDIRECT in the last 72 hours.  Lab Results  Component Value Date   HGBA1C 5.5 03/29/2019   ------------------------------------------------------------------------------------------------------------------ No results for input(s): TSH, T4TOTAL, T3FREE, THYROIDAB in the last 72 hours.  Invalid input(s): FREET3 ------------------------------------------------------------------------------------------------------------------ No results for input(s): VITAMINB12, FOLATE, FERRITIN, TIBC, IRON, RETICCTPCT in the last 72 hours.  Coagulation profile Recent Labs  Lab 03/28/19 2254  INR 1.1    No results for input(s): DDIMER in the last 72 hours.  Cardiac Enzymes No results for input(s): CKMB, TROPONINI, MYOGLOBIN in the last 168 hours.  Invalid input(s): CK ------------------------------------------------------------------------------------------------------------------    Component Value Date/Time   BNP 88.0 03/28/2019 2249    Inpatient Medications  Scheduled Meds:  bisacodyl  10 mg Rectal Daily   clopidogrel  75 mg Oral Q breakfast   feeding supplement (ENSURE ENLIVE)  237 mL Oral BID BM   hydrocerin   Topical BID   insulin aspart  0-5 Units Subcutaneous QHS   insulin aspart  0-9 Units Subcutaneous TID WC   polyethylene glycol  17 g Oral Daily   Rivaroxaban  15 mg Oral BID WC   senna  1 tablet Oral BID   simvastatin  20 mg Oral QHS   tamsulosin  0.4 mg Oral QHS   vitamin B-12  100 mcg Oral Daily   Continuous  Infusions:  sodium chloride      ceFAZolin (ANCEF) IV     PRN Meds:.sodium chloride, acetaminophen **OR** acetaminophen, sodium chloride flush, traMADol  Micro Results Recent Results (from the past 240 hour(s))  Blood Culture (routine x 2)     Status: None (Preliminary result)   Collection Time: 03/28/19 10:54 PM   Specimen: BLOOD  Result Value Ref Range Status   Specimen Description   Final    BLOOD LEFT ANTECUBITAL Performed at [facility], [address] [place], [place] [address]    Special Requests   Final    BOTTLES DRAWN AEROBIC AND ANAEROBIC Blood Culture adequate volume Performed at [facility], [address], [place], [place] [address]    Culture   Final    NO GROWTH 2 DAYS Performed at [facility], [address] [place], [place] [address]    Report Status PENDING  Incomplete  Blood Culture (routine x 2)     Status: None (Preliminary result)   Collection Time: 03/28/19 10:59 PM   Specimen: BLOOD RIGHT HAND  Result Value Ref Range Status   Specimen Description   Final    BLOOD RIGHT HAND Performed at [facility], [place] [address], [place], [place] [address]    Special Requests   Final    BOTTLES DRAWN AEROBIC AND ANAEROBIC Blood Culture adequate volume Performed at [facility] [address] [place], [place] [address]    Culture   Final    NO GROWTH 2 DAYS Performed at [facility] [address] [place], [place] [address]    Report Status PENDING  Incomplete  Urine culture     Status: Abnormal   Collection Time: 03/29/19  1:21 AM   Specimen: In/Out Cath Urine  Result Value Ref Range Status   Specimen Description   Final    IN/OUT CATH URINE Performed at [facility] [address], [place], [place] [address]    Special Requests   Final    NONE Performed at [facility], [place] [address] [place], [place] [address]    Culture (A)   Final    30,000 COLONIES/mL MULTIPLE SPECIES PRESENT, SUGGEST RECOLLECTION   Report Status 03/30/2019 FINAL  Final  SARS CORONAVIRUS 2 (TAT 6-24 HRS) Nasopharyngeal Nasopharyngeal Swab     Status: None   Collection Time: 03/29/19  2:32 AM   Specimen: Nasopharyngeal Swab  Result Value Ref Range Status   SARS Coronavirus 2 NEGATIVE NEGATIVE Final    Comment: (NOTE) SARS-CoV-2 target nucleic acids are NOT DETECTED. The SARS-CoV-2 RNA is generally detectable in upper and lower respiratory specimens during the acute phase of infection. Negative results do not preclude SARS-CoV-2 infection, do not rule out co-infections with other pathogens, and should not be used as the sole basis for treatment or other patient management decisions. Negative results must be combined with clinical observations, patient history, and epidemiological information. The expected result is Negative. Fact Sheet for Patients: SugarRoll.be Fact Sheet for Healthcare Providers: https://www.woods-mathews.com/ This test is not yet approved or cleared by the [place] FDA and  has been authorized for detection and/or diagnosis of SARS-CoV-2 by FDA under an Emergency Use Authorization (EUA). This EUA will remain  in effect (meaning this test can be used) for the duration of the COVID-19 declaration under Section 56 4(b)(1) of the Act, 21 U.S.C. section 360bbb-3(b)(1), unless the authorization is terminated or revoked sooner. Performed at [facility], [place] [address], [place], [place] [address]   MRSA PCR Screening     Status: None   Collection Time: 03/29/19  4:00 AM   Specimen: Nasal Mucosa; Nasopharyngeal  Result Value Ref Range Status   MRSA by PCR NEGATIVE NEGATIVE Final    Comment:        The GeneXpert MRSA Assay (FDA approved for NASAL specimens only), is one component of a comprehensive MRSA colonization surveillance program. It is not intended to  diagnose MRSA infection nor to guide or monitor treatment for MRSA infections. Performed at [facility], [place] [address], [place], [place] [address]     Radiology Reports Ct Angio Chest Pe W Or Wo Contrast  Result Date: 03/30/2019 CLINICAL DATA:  Lower extremity DVT, high probability pulmonary embolism EXAM: CT ANGIOGRAPHY CHEST WITH CONTRAST TECHNIQUE: Multidetector CT imaging of the chest was performed using the standard protocol during bolus administration of intravenous contrast. Multiplanar CT image reconstructions and MIPs were obtained to evaluate the vascular anatomy. CONTRAST:  36mL OMNIPAQUE IOHEXOL 350 MG/ML SOLN COMPARISON:  CT  abdomen 11/21/2018 FINDINGS: Cardiovascular: Heart size normal. No pericardial effusion. Dilated central pulmonary arteries. No central or saddle emboli. Segmental embolus in a right upper lobe pulmonary arterial branch image 100/5. Nonocclusive thrombus in a distal right lower lobe pulmonary arterial branch image 149/5. Motion degrades some images through the left lung base. Fair contrast opacification of the thoracic aorta. No dissection or stenosis. Dilatation of the ascending segment up to 4.2 cm, arch 3.5 cm. Mediastinum/Nodes: No hilar or mediastinal adenopathy. Lungs/Pleura: Trace right pleural effusion. No pneumothorax. Minimal dependent atelectasis posteriorly in the right lower lobe. Lungs otherwise clear. Upper Abdomen: Small low-attenuation hepatic lesions as before, presumably cysts in the absence of a history of primary carcinoma. Partially calcified 2.1 cm stone in the nondilated gallbladder. Bilateral low-attenuation renal lesions stable, probably possibly cysts but incompletely characterized. No acute findings. Musculoskeletal: No chest wall abnormality. No acute or significant osseous findings. Review of the MIP images confirms the above findings. IMPRESSION: 1. POSITIVE for at least 2 right segmental/subsegmental pulmonary emboli.  2. Trace right pleural effusion. 3. Dilated central pulmonary arteries suggesting pulmonary hypertension. 4. Thoracic aortic aneurysm without complicating features. Consider annual imaging followup by CTA or MRA. This recommendation follows 2010 ACCF/AHA/AATS/ACR/ASA/SCA/SCAI/SIR/STS/SVM Guidelines for the Diagnosis and Management of Patients with Thoracic Aortic Disease. Circulation.2010; 121: Z610-R604 Electronically Signed   By: Corlis Leak M.D.   On: 03/30/2019 10:23   Dg Chest Port 1 View  Result Date: 03/28/2019 CLINICAL DATA:  Edema, rales EXAM: PORTABLE CHEST 1 VIEW COMPARISON:  January 10, 2018 FINDINGS: The heart size and mediastinal contours are unchanged. There is a tortuous descending aorta. Elevation of the right hemidiaphragm. The lungs are clear. No focal airspace consolidation or pleural effusion. No acute osseous abnormality. IMPRESSION: No acute cardiopulmonary process. Electronically Signed   By: Jonna Clark M.D.   On: 03/28/2019 23:57   Vas [place] Lower Extremity Venous (dvt)  Result Date: 03/30/2019  Lower Venous Study Indications: Bilateral edema and blistering of lower legs.  Comparison Study: 01/21/18 negative Performing Technologist: Jeb Levering RDMS, RVT  Examination Guidelines: A complete evaluation includes B-mode imaging, spectral Doppler, color Doppler, and power Doppler as needed of all accessible portions of each vessel. Bilateral testing is considered an integral part of a complete examination. Limited examinations for reoccurring indications may be performed as noted.  +---------+---------------+---------+-----------+----------+--------------+  RIGHT     Compressibility Phasicity Spontaneity Properties Thrombus Aging  +---------+---------------+---------+-----------+----------+--------------+  CFV       Full                                                             +---------+---------------+---------+-----------+----------+--------------+  SFJ       Full                                                              +---------+---------------+---------+-----------+----------+--------------+  FV Prox   Full                                                             +---------+---------------+---------+-----------+----------+--------------+  FV Mid    Full                                                             +---------+---------------+---------+-----------+----------+--------------+  FV Distal Full                                                             +---------+---------------+---------+-----------+----------+--------------+  PFV       Full                                                             +---------+---------------+---------+-----------+----------+--------------+  POP                       Yes       Yes                                    +---------+---------------+---------+-----------+----------+--------------+  PTV       Full                                                             +---------+---------------+---------+-----------+----------+--------------+  PERO      Full                                                             +---------+---------------+---------+-----------+----------+--------------+   +--------------+---------------+---------+-----------+----------+--------------+  LEFT           Compressibility Phasicity Spontaneity Properties Thrombus Aging  +--------------+---------------+---------+-----------+----------+--------------+  CFV            None            No        No                                     +--------------+---------------+---------+-----------+----------+--------------+  SFJ            None                                                             +--------------+---------------+---------+-----------+----------+--------------+  FV Prox        Full                                                             +--------------+---------------+---------+-----------+----------+--------------+  FV Mid         Full                                                              +--------------+---------------+---------+-----------+----------+--------------+  FV Distal      Full                                                             +--------------+---------------+---------+-----------+----------+--------------+  PFV            None                                                             +--------------+---------------+---------+-----------+----------+--------------+  POP            Full            Yes       Yes                                    +--------------+---------------+---------+-----------+----------+--------------+  PTV            Full                                                             +--------------+---------------+---------+-----------+----------+--------------+  PERO           Full                                                             +--------------+---------------+---------+-----------+----------+--------------+  Iliac mid-dist Full            Yes       Yes                                    +--------------+---------------+---------+-----------+----------+--------------+     Summary: Right: There is no evidence of deep vein thrombosis in the lower extremity. No cystic structure found in the popliteal fossa. Ultrasound characteristics of enlarged lymph nodes are noted in the groin. Left: Findings consistent with acute deep vein thrombosis involving the left common femoral vein, and left proximal profunda vein. No cystic structure found in the popliteal fossa.  *See table(s) above for measurements and observations. Electronically signed by Fabienne Brunsharles Fields MD on 03/30/2019 at 10:59:56 AM.    Final     Time Spent in minutes  35   Cevin Rubinstein M.D on 03/31/2019 at  11:47 AM  Between 7am to 7pm - Pager - [phone]  After 7pm go to [email or web address] - password [email or web address]  [facility] -  Office  [phone]

## 2019-04-01 LAB — GLUCOSE, CAPILLARY
Glucose-Capillary: 87 mg/dL (ref 70–99)
Glucose-Capillary: 107 mg/dL — ABNORMAL HIGH (ref 70–99)

## 2019-04-01 MED ORDER — ENSURE ENLIVE PO LIQD: 237.0000 mL | Freq: Two times a day (BID) | ORAL | 12 refills | Status: AC

## 2019-04-01 MED ORDER — LISINOPRIL 5 MG PO TABS: 2.5000 mg | ORAL_TABLET | Freq: Every day | ORAL | 4 refills | Status: DC

## 2019-04-01 MED ORDER — FUROSEMIDE 40 MG PO TABS: 20.0000 mg | ORAL_TABLET | Freq: Every day | ORAL | 4 refills | Status: DC

## 2019-04-01 MED ORDER — RIVAROXABAN (XARELTO) VTE STARTER PACK (15 & 20 MG): ORAL_TABLET | ORAL | 0 refills | Status: DC

## 2019-04-01 MED ORDER — HYDROCERIN EX CREA: 1.0000 "application " | TOPICAL_CREAM | Freq: Two times a day (BID) | CUTANEOUS | 0 refills | Status: AC

## 2019-04-01 MED ORDER — DOXYCYCLINE HYCLATE 100 MG PO TABS: 100.0000 mg | ORAL_TABLET | Freq: Two times a day (BID) | ORAL | 0 refills | Status: DC

## 2019-04-01 NOTE — Discharge Summary (Signed)
Physician Discharge Summary  Mark Bullock ZOX:[id number] DOB: [birth year] DOA: 03/28/2019  PCP: Burton Apley, MD  Admit date: 03/28/2019 Discharge date: 04/01/2019  Admitted From: Assisted living Disposition: Assisted living  Recommendations for Outpatient Follow-up:  1. Follow-up with PCP in 1 week. 2. Patient is being discharged on Xarelto for at least 6 months of anticoagulation.  He will need Doppler of the left lower extremity to ensure resolution after completion of anticoagulation.   Home Health: RN and PT Equipment/Devices: None  Discharge Condition: Fair CODE STATUS: Full code Diet recommendation: Heart Healthy / Carb Modified     Discharge Diagnoses:  Principal Problem:   Leg DVT (deep venous thromboembolism), acute, left (HCC)   Active Problems:   Acute pulmonary embolism without acute cor pulmonale (HCC)   Cellulitis of leg, left   Chronic systolic CHF (congestive heart failure) (HCC)   Bilateral lower extremity edema   CKD (chronic kidney disease), stage III (HCC)   Essential hypertension, benign   Type II diabetes mellitus with stage 3 chronic kidney disease (HCC)   Dyslipidemia associated with type 2 diabetes mellitus (HCC)   Vitamin B 12 deficiency   Chronic cerebrovascular accident (CVA)  Brief narrative/HPI 85 year old male resident of assisted living with history of diabetes mellitus, history of stroke with left-sided weakness, hypertension, hyperlipidemia presented with progressive worsening bilateral leg swellings (L >R) of unknown duration with some pain.  Patient on Lasix and as per the facility who told EMS that patient has been noncompliant with his Lasix.  Patient is a poor historian.  Denies any fevers or chills.  Patient noted to have increased redness and warmth over the lower extremities (L >R). In the ED he was afebrile.  Blood pressure soft (99/61 mmHg).  Blood work showed normal WBC, mildly elevated BUN of 24 and normal  creatinine. Chest x-ray without acute findings.  UA unremarkable. Admitted for significant cellulitis requiring IV antibiotics. Patient noted to have left lower extremity DVT followed by CT angiogram showing subsegmental PE.  Hospital course   Principal Problem: Left lower extremity DVT and subsegmental pulmonary embolism Started on oral Xarelto.   Hemodynamically stable. 2D echo showed EF of 35-40% with no right heart strain (previous echo showed EF of 45%) Patient will need at least 6 months of anticoagulation.    Cellulitis of bilateral lower extremity (L >R) with serous weeping Has bilateral leg swellings with what appears chronic skin changes but with skin breakdown.    Switched antibiotics to IV Ancef.  Wound care consult appreciated for linear breaks in skin with serous weeping.  Recommends twice daily cleansing, moisturizer and wrap the legs with Kerlix roll and Ace bandage. I will discharge him on oral doxycycline to complete total 10 days course of antibiotic.   Active Problems: Chronic systolic CHF with bilateral lower extremity edema. Worsened EF on 2D echo (35-40%) without right heart strain, impaired LV relaxation.  Euvolemic. Lasix held due to low blood pressure.  Also on lisinopril at home which is held.    Added back upon discharge with low-dose.  Resume statin. Outpatient follow-up.    History of CVA with residual left-sided weakness Continue Plavix and statin.  Reports ambulating with walker.   PT recommends home health  Hypertension Lisinopril and Lasix held due to soft/low blood pressure.  Currently stable.  Resumed both at lower dose.  BPH Continue Flomax  B12 deficiency Continue supplement   Type 2 diabetes mellitus with CKD stage III Stable with A1c of 5.5.  Diet controlled  Hypokalemia Replenished     Family Communication  : None  Disposition Plan  : Return to ALF with home health RN and PT  Procedures  : Doppler lower  extremity, CT angiogram chest, 2D echo   Discharge Instructions   Allergies as of 04/01/2019   No Known Allergies     Medication List    STOP taking these medications   guaiFENesin 600 MG 12 hr tablet Commonly known as: MUCINEX     TAKE these medications   clopidogrel 75 MG tablet Commonly known as: PLAVIX Take 75 mg by mouth daily.   doxycycline 100 MG tablet Commonly known as: VIBRA-TABS Take 1 tablet (100 mg total) by mouth 2 (two) times daily.   feeding supplement (ENSURE ENLIVE) Liqd Take 237 mLs by mouth 2 (two) times daily between meals.   furosemide 40 MG tablet Commonly known as: LASIX Take 0.5 tablets (20 mg total) by mouth daily. What changed: how much to take   hydrocerin Crea Apply 1 application topically 2 (two) times daily. Bilateral lower legs   lisinopril 5 MG tablet Commonly known as: ZESTRIL Take 0.5 tablets (2.5 mg total) by mouth daily. What changed: how much to take   polyethylene glycol 17 g packet Commonly known as: MIRALAX / GLYCOLAX Take 17 g by mouth daily.   potassium chloride 10 MEQ tablet Commonly known as: K-DUR Take 10 mEq by mouth daily.   Rivaroxaban 15 & 20 MG Tbpk Follow package directions: Take one 15mg  tablet by mouth twice a day. On day 22, switch to one 20mg  tablet once a day. Take with food.   senna 8.6 MG tablet Commonly known as: SENOKOT Take 1 tablet by mouth 2 (two) times daily.   simvastatin 20 MG tablet Commonly known as: ZOCOR Take 20 mg by mouth daily.   tamsulosin 0.4 MG Caps capsule Commonly known as: FLOMAX Take 0.4 mg by mouth at bedtime.   traMADol 50 MG tablet Commonly known as: ULTRAM Take 1 tablet (50 mg total) by mouth every 6 (six) hours as needed. What changed: reasons to take this   vitamin B-12 100 MCG tablet Commonly known as: CYANOCOBALAMIN Take 100 mcg by mouth daily.       Contact information for follow-up providers    Burton Apley, MD Follow up in 1 week(s).   Specialty:  Internal Medicine Contact information: [address] [place] [address] [phone]            Contact information for after-discharge care    Destination    [facility] .   Service: Assisted Living Contact information: [address] [place] [address] [phone]                 No Known Allergies   Procedures/Studies: Ct Angio Chest Pe W Or Wo Contrast  Result Date: 03/30/2019 CLINICAL DATA:  Lower extremity DVT, high probability pulmonary embolism EXAM: CT ANGIOGRAPHY CHEST WITH CONTRAST TECHNIQUE: Multidetector CT imaging of the chest was performed using the standard protocol during bolus administration of intravenous contrast. Multiplanar CT image reconstructions and MIPs were obtained to evaluate the vascular anatomy. CONTRAST:  63mL OMNIPAQUE IOHEXOL 350 MG/ML SOLN COMPARISON:  CT abdomen 11/21/2018 FINDINGS: Cardiovascular: Heart size normal. No pericardial effusion. Dilated central pulmonary arteries. No central or saddle emboli. Segmental embolus in a right upper lobe pulmonary arterial branch image 100/5. Nonocclusive thrombus in a distal right lower lobe pulmonary arterial branch image 149/5. Motion degrades some  images through the left lung base. Fair contrast opacification of the thoracic aorta. No dissection or stenosis. Dilatation of the ascending segment up to 4.2 cm, arch 3.5 cm. Mediastinum/Nodes: No hilar or mediastinal adenopathy. Lungs/Pleura: Trace right pleural effusion. No pneumothorax. Minimal dependent atelectasis posteriorly in the right lower lobe. Lungs otherwise clear. Upper Abdomen: Small low-attenuation hepatic lesions as before, presumably cysts in the absence of a history of primary carcinoma. Partially calcified 2.1 cm stone in the nondilated gallbladder. Bilateral low-attenuation renal lesions stable, probably possibly cysts but incompletely characterized. No acute findings. Musculoskeletal: No chest  wall abnormality. No acute or significant osseous findings. Review of the MIP images confirms the above findings. IMPRESSION: 1. POSITIVE for at least 2 right segmental/subsegmental pulmonary emboli. 2. Trace right pleural effusion. 3. Dilated central pulmonary arteries suggesting pulmonary hypertension. 4. Thoracic aortic aneurysm without complicating features. Consider annual imaging followup by CTA or MRA. This recommendation follows 2010 ACCF/AHA/AATS/ACR/ASA/SCA/SCAI/SIR/STS/SVM Guidelines for the Diagnosis and Management of Patients with Thoracic Aortic Disease. Circulation.2010; 121: A540-J811 Electronically Signed   By: Corlis Leak M.D.   On: 03/30/2019 10:23   Dg Chest Port 1 View  Result Date: 03/28/2019 CLINICAL DATA:  Edema, rales EXAM: PORTABLE CHEST 1 VIEW COMPARISON:  January 10, 2018 FINDINGS: The heart size and mediastinal contours are unchanged. There is a tortuous descending aorta. Elevation of the right hemidiaphragm. The lungs are clear. No focal airspace consolidation or pleural effusion. No acute osseous abnormality. IMPRESSION: No acute cardiopulmonary process. Electronically Signed   By: Jonna Clark M.D.   On: 03/28/2019 23:57   Vas [place] Lower Extremity Venous (dvt)  Result Date: 03/30/2019  Lower Venous Study Indications: Bilateral edema and blistering of lower legs.  Comparison Study: 01/21/18 negative Performing Technologist: Jeb Levering RDMS, RVT  Examination Guidelines: A complete evaluation includes B-mode imaging, spectral Doppler, color Doppler, and power Doppler as needed of all accessible portions of each vessel. Bilateral testing is considered an integral part of a complete examination. Limited examinations for reoccurring indications may be performed as noted.  +---------+---------------+---------+-----------+----------+--------------+ RIGHT    CompressibilityPhasicitySpontaneityPropertiesThrombus Aging  +---------+---------------+---------+-----------+----------+--------------+ CFV      Full                                                        +---------+---------------+---------+-----------+----------+--------------+ SFJ      Full                                                        +---------+---------------+---------+-----------+----------+--------------+ FV Prox  Full                                                        +---------+---------------+---------+-----------+----------+--------------+ FV Mid   Full                                                        +---------+---------------+---------+-----------+----------+--------------+  FV DistalFull                                                        +---------+---------------+---------+-----------+----------+--------------+ PFV      Full                                                        +---------+---------------+---------+-----------+----------+--------------+ POP                     Yes      Yes                                 +---------+---------------+---------+-----------+----------+--------------+ PTV      Full                                                        +---------+---------------+---------+-----------+----------+--------------+ PERO     Full                                                        +---------+---------------+---------+-----------+----------+--------------+   +--------------+---------------+---------+-----------+----------+--------------+ LEFT          CompressibilityPhasicitySpontaneityPropertiesThrombus Aging +--------------+---------------+---------+-----------+----------+--------------+ CFV           None           No       No                                  +--------------+---------------+---------+-----------+----------+--------------+ SFJ           None                                                         +--------------+---------------+---------+-----------+----------+--------------+ FV Prox       Full                                                        +--------------+---------------+---------+-----------+----------+--------------+ FV Mid        Full                                                        +--------------+---------------+---------+-----------+----------+--------------+ FV Distal     Full                                                        +--------------+---------------+---------+-----------+----------+--------------+  PFV           None                                                        +--------------+---------------+---------+-----------+----------+--------------+ POP           Full           Yes      Yes                                 +--------------+---------------+---------+-----------+----------+--------------+ PTV           Full                                                        +--------------+---------------+---------+-----------+----------+--------------+ PERO          Full                                                        +--------------+---------------+---------+-----------+----------+--------------+ Iliac mid-distFull           Yes      Yes                                 +--------------+---------------+---------+-----------+----------+--------------+     Summary: Right: There is no evidence of deep vein thrombosis in the lower extremity. No cystic structure found in the popliteal fossa. Ultrasound characteristics of enlarged lymph nodes are noted in the groin. Left: Findings consistent with acute deep vein thrombosis involving the left common femoral vein, and left proximal profunda vein. No cystic structure found in the popliteal fossa.  *See table(s) above for measurements and observations. Electronically signed by Ruta Hinds MD on 03/30/2019 at 10:59:56 AM.    Final        Subjective: Denies any  shortness of breath or pain in the legs.  Discharge Exam: Vitals:   03/31/19 2114 04/01/19 0615  BP: 103/61 93/74  Pulse: 68 75  Resp: 18 16  Temp: 98.2 F (36.8 C) 98.2 F (36.8 C)  SpO2: 100% 96%   Vitals:   03/31/19 0535 03/31/19 1407 03/31/19 2114 04/01/19 0615  BP: 129/73 106/65 103/61 93/74  Pulse: (!) 58 81 68 75  Resp: 20 20 18 16   Temp: 97.6 F (36.4 C) 97.6 F (36.4 C) 98.2 F (36.8 C) 98.2 F (36.8 C)  TempSrc: Oral  Oral Oral  SpO2: 98% 100% 100% 96%  Weight: 83.6 kg   87.5 kg  Height:        General: Elderly male not in distress HEENT: Moist mucosa, supple neck Chest: Clear bilaterally CVs: Normal S1-S2, no murmurs GI: Soft, nondistended, nontender Musculoskeletal: Warm, Ace wrap over bilateral leg     The results of significant diagnostics from this hospitalization (including imaging, microbiology, ancillary and laboratory) are listed below for reference.     Microbiology: Recent Results (from the  past 240 hour(s))  Blood Culture (routine x 2)     Status: None (Preliminary result)   Collection Time: 03/28/19 10:54 PM   Specimen: BLOOD  Result Value Ref Range Status   Specimen Description   Final    BLOOD LEFT ANTECUBITAL Performed at [facility], [address], [place], [place] [address]    Special Requests   Final    BOTTLES DRAWN AEROBIC AND ANAEROBIC Blood Culture adequate volume Performed at [facility], [address] [place], [place] [address]    Culture   Final    NO GROWTH 3 DAYS Performed at [facility], [address] [place], [place] [address]    Report Status PENDING  Incomplete  Blood Culture (routine x 2)     Status: None (Preliminary result)   Collection Time: 03/28/19 10:59 PM   Specimen: BLOOD RIGHT HAND  Result Value Ref Range Status   Specimen Description   Final    BLOOD RIGHT HAND Performed at [facility], [address] [place], [place] [address]     Special Requests   Final    BOTTLES DRAWN AEROBIC AND ANAEROBIC Blood Culture adequate volume Performed at [facility], [address], [place], [place] [address]    Culture   Final    NO GROWTH 3 DAYS Performed at [facility], [address], [place], [place] [address]    Report Status PENDING  Incomplete  Urine culture     Status: Abnormal   Collection Time: 03/29/19  1:21 AM   Specimen: In/Out Cath Urine  Result Value Ref Range Status   Specimen Description   Final    IN/OUT CATH URINE Performed at [facility], [address] [place], [place] [address]    Special Requests   Final    NONE Performed at [facility], [address] [place], [place] [address]    Culture (A)  Final    30,000 COLONIES/mL MULTIPLE SPECIES PRESENT, SUGGEST RECOLLECTION   Report Status 03/30/2019 FINAL  Final  SARS CORONAVIRUS 2 (TAT 6-24 HRS) Nasopharyngeal Nasopharyngeal Swab     Status: None   Collection Time: 03/29/19  2:32 AM   Specimen: Nasopharyngeal Swab  Result Value Ref Range Status   SARS Coronavirus 2 NEGATIVE NEGATIVE Final    Comment: (NOTE) SARS-CoV-2 target nucleic acids are NOT DETECTED. The SARS-CoV-2 RNA is generally detectable in upper and lower respiratory specimens during the acute phase of infection. Negative results do not preclude SARS-CoV-2 infection, do not rule out co-infections with other pathogens, and should not be used as the sole basis for treatment or other patient management decisions. Negative results must be combined with clinical observations, patient history, and epidemiological information. The expected result is Negative. Fact Sheet for Patients: HairSlick.no Fact Sheet for Healthcare Providers: quierodirigir.com This test is not yet approved or cleared by the [place] FDA and  has been authorized for detection and/or diagnosis of  SARS-CoV-2 by FDA under an Emergency Use Authorization (EUA). This EUA will remain  in effect (meaning this test can be used) for the duration of the COVID-19 declaration under Section 56 4(b)(1) of the Act, 21 U.S.C. section 360bbb-3(b)(1), unless the authorization is terminated or revoked sooner. Performed at [facility], [address], [place], [place] [address]   MRSA PCR Screening     Status: None   Collection Time: 03/29/19  4:00 AM  Specimen: Nasal Mucosa; Nasopharyngeal  Result Value Ref Range Status   MRSA by PCR NEGATIVE NEGATIVE Final    Comment:        The GeneXpert MRSA Assay (FDA approved for NASAL specimens only), is one component of a comprehensive MRSA colonization surveillance program. It is not intended to diagnose MRSA infection nor to guide or monitor treatment for MRSA infections. Performed at [facility], [address] [place], [place] [address]      Labs: BNP (last 3 results) Recent Labs    03/28/19 2249  BNP 88.0   Basic Metabolic Panel: Recent Labs  Lab 03/28/19 2249 03/29/19 0449 03/30/19 0614  NA 139 141 140  K 3.8 3.2* 4.0  CL 104 105 105  CO2 27 28 27   GLUCOSE 81 84 77  BUN 24* 23 17  CREATININE 0.92 0.84 0.83  CALCIUM 8.7* 8.5* 8.5*   Liver Function Tests: Recent Labs  Lab 03/28/19 2249 03/29/19 0449 03/30/19 0614  AST 13* 13* 9*  ALT 9 7 8   ALKPHOS 53 52 46  BILITOT 0.8 1.0 0.6  PROT 6.2* 6.4* 5.4*  ALBUMIN 3.0* 3.0* 2.6*   No results for input(s): LIPASE, AMYLASE in the last 168 hours. No results for input(s): AMMONIA in the last 168 hours. CBC: Recent Labs  Lab 03/28/19 2249 03/29/19 0449 03/30/19 0614 03/31/19 0554  WBC 7.9 7.7 7.5 8.3  NEUTROABS 5.5  --   --   --   HGB 11.4* 11.3* 10.3* 12.4*  HCT 36.2* 36.7* 32.5* 39.9  MCV 89.4 90.0 87.4 89.7  PLT 244 249 220 253   Cardiac Enzymes: No results for input(s): CKTOTAL, CKMB, CKMBINDEX, TROPONINI in the last 168  hours. BNP: Invalid input(s): POCBNP CBG: Recent Labs  Lab 03/31/19 0805 03/31/19 1126 03/31/19 1657 03/31/19 2115 04/01/19 0806  GLUCAP 75 86 111* 135* 87   D-Dimer No results for input(s): DDIMER in the last 72 hours. Hgb A1c No results for input(s): HGBA1C in the last 72 hours. Lipid Profile No results for input(s): CHOL, HDL, LDLCALC, TRIG, CHOLHDL, LDLDIRECT in the last 72 hours. Thyroid function studies No results for input(s): TSH, T4TOTAL, T3FREE, THYROIDAB in the last 72 hours.  Invalid input(s): FREET3 Anemia work up No results for input(s): VITAMINB12, FOLATE, FERRITIN, TIBC, IRON, RETICCTPCT in the last 72 hours. Urinalysis    Component Value Date/Time   COLORURINE YELLOW 03/29/2019 0121   APPEARANCEUR CLEAR 03/29/2019 0121   LABSPEC 1.019 03/29/2019 0121   PHURINE 5.0 03/29/2019 0121   GLUCOSEU NEGATIVE 03/29/2019 0121   HGBUR NEGATIVE 03/29/2019 0121   BILIRUBINUR NEGATIVE 03/29/2019 0121   KETONESUR NEGATIVE 03/29/2019 0121   PROTEINUR NEGATIVE 03/29/2019 0121   UROBILINOGEN 1.0 01/16/2010 0200   NITRITE NEGATIVE 03/29/2019 0121   LEUKOCYTESUR SMALL (A) 03/29/2019 0121   Sepsis Labs Invalid input(s): PROCALCITONIN,  WBC,  LACTICIDVEN Microbiology Recent Results (from the past 240 hour(s))  Blood Culture (routine x 2)     Status: None (Preliminary result)   Collection Time: 03/28/19 10:54 PM   Specimen: BLOOD  Result Value Ref Range Status   Specimen Description   Final    BLOOD LEFT ANTECUBITAL Performed at [facility], [address] [place], [place] [address]    Special Requests   Final    BOTTLES DRAWN AEROBIC AND ANAEROBIC Blood Culture adequate volume Performed at [facility], [address] [place], [place] [address]    Culture   Final  NO GROWTH 3 DAYS Performed at [facility], [address], [place], [place] [address]    Report Status PENDING  Incomplete  Blood Culture (routine x  2)     Status: None (Preliminary result)   Collection Time: 03/28/19 10:59 PM   Specimen: BLOOD RIGHT HAND  Result Value Ref Range Status   Specimen Description   Final    BLOOD RIGHT HAND Performed at [facility], [address] [place], [place] [address]    Special Requests   Final    BOTTLES DRAWN AEROBIC AND ANAEROBIC Blood Culture adequate volume Performed at [facility], [address], [place], [place] [address]    Culture   Final    NO GROWTH 3 DAYS Performed at [facility], [address], [place], [place] [address]    Report Status PENDING  Incomplete  Urine culture     Status: Abnormal   Collection Time: 03/29/19  1:21 AM   Specimen: In/Out Cath Urine  Result Value Ref Range Status   Specimen Description   Final    IN/OUT CATH URINE Performed at [facility], [address], [place], [place] [address]    Special Requests   Final    NONE Performed at [facility], [address], [place], [place] [address]    Culture (A)  Final    30,000 COLONIES/mL MULTIPLE SPECIES PRESENT, SUGGEST RECOLLECTION   Report Status 03/30/2019 FINAL  Final  SARS CORONAVIRUS 2 (TAT 6-24 HRS) Nasopharyngeal Nasopharyngeal Swab     Status: None   Collection Time: 03/29/19  2:32 AM   Specimen: Nasopharyngeal Swab  Result Value Ref Range Status   SARS Coronavirus 2 NEGATIVE NEGATIVE Final    Comment: (NOTE) SARS-CoV-2 target nucleic acids are NOT DETECTED. The SARS-CoV-2 RNA is generally detectable in upper and lower respiratory specimens during the acute phase of infection. Negative results do not preclude SARS-CoV-2 infection, do not rule out co-infections with other pathogens, and should not be used as the sole basis for treatment or other patient management decisions. Negative results must be combined with clinical observations, patient history, and epidemiological information. The expected result is  Negative. Fact Sheet for Patients: HairSlick.no Fact Sheet for Healthcare Providers: quierodirigir.com This test is not yet approved or cleared by the [place] FDA and  has been authorized for detection and/or diagnosis of SARS-CoV-2 by FDA under an Emergency Use Authorization (EUA). This EUA will remain  in effect (meaning this test can be used) for the duration of the COVID-19 declaration under Section 56 4(b)(1) of the Act, 21 U.S.C. section 360bbb-3(b)(1), unless the authorization is terminated or revoked sooner. Performed at [facility], [address] [place], [place] [address]   MRSA PCR Screening     Status: None   Collection Time: 03/29/19  4:00 AM   Specimen: Nasal Mucosa; Nasopharyngeal  Result Value Ref Range Status   MRSA by PCR NEGATIVE NEGATIVE Final    Comment:        The GeneXpert MRSA Assay (FDA approved for NASAL specimens only), is one component of a comprehensive MRSA colonization surveillance program. It is not intended to diagnose MRSA infection nor to guide or monitor treatment for MRSA infections. Performed at [facility], [address] [address] [place], [place] [address]      Time coordinating discharge: 35 minutes  SIGNED:   Eddie North, MD  [facility] 04/01/2019, 11:26 AM Pager  If 7PM-7AM, please contact night-coverage [email or web address] Password [email or web address]

## 2019-04-01 NOTE — Progress Notes (Signed)
Pt d/c to [place] via [place]. Report given to [facility]. D/C instructions and prescriptions sent with pt. IV's removed. No pain. Pt also sent with personal belongings

## 2019-04-01 NOTE — Care Management Important Message (Signed)
Important Message  Patient Details IM Letter given to Nancy Marus RN to present to the Patient Name: Mark Bullock MRN: [id number] Date of Birth: [birth year]   Medicare Important Message Given:  Yes     Kerin Salen 04/01/2019, 9:53 AM

## 2019-04-01 NOTE — NC FL2 (Addendum)
[place] LEVEL OF CARE SCREENING TOOL     IDENTIFICATION  Patient Name: Mark Bullock Birthdate: 16-Feb-1929 Sex: male Admission Date (Current Location): 03/28/2019  [facility] and [place] Number:  Herbalist and Address:  [facility],  [place] [address] [place]      Provider Number: [id number]  Attending Physician Name and Address:  Louellen Molder, MD  Relative Name and Phone Number:       Current Level of Care: SNF Recommended Level of Care: [place] Prior Approval Number:    Date Approved/Denied:   PASRR Number:    Discharge Plan: Other (Comment)(assisted living facility)    Current Diagnoses: Patient Active Problem List   Diagnosis Date Noted  . Chronic systolic CHF (congestive heart failure) ([place]) 03/30/2019  . Leg DVT (deep venous thromboembolism), acute, left ([place]) 03/30/2019  . Acute pulmonary embolism without acute cor pulmonale ([place]) 03/30/2019  . Cellulitis of leg, left 03/29/2019  . Chronic cerebrovascular accident (CVA) 07/07/2017  . Essential hypertension, benign 05/24/2017  . Type II diabetes mellitus with stage 3 chronic kidney disease ([place]) 05/24/2017  . Dyslipidemia associated with type 2 diabetes mellitus ([place]) 05/24/2017  . Vascular dementia without behavioral disturbance ([place]) 05/24/2017  . Vitamin B 12 deficiency 05/24/2017  . Rhabdomyolysis 05/19/2017  . AKI (acute kidney injury) ([place]) 05/18/2017  . Elevated troponin 05/18/2017  . History of completed stroke 05/18/2017  . CKD (chronic kidney disease), stage III ([place]) 05/18/2017  . Physical deconditioning 05/08/2017  . Osteoarthritis 05/08/2017  . Bilateral lower extremity edema   . Abnormal EKG   . Elevated brain natriuretic peptide (BNP) level     Orientation RESPIRATION BLADDER Height & Weight     Self, Time, Situation, Place  Normal Incontinent Weight: 87.5 kg Height:  5\' 8"  (172.7 cm)  BEHAVIORAL SYMPTOMS/MOOD  NEUROLOGICAL BOWEL NUTRITION STATUS      Continent Diet  AMBULATORY STATUS COMMUNICATION OF NEEDS Skin   Supervision Verbally Normal                       Personal Care Assistance Level of Assistance  Bathing, Dressing Bathing Assistance: Limited assistance   Dressing Assistance: Limited assistance     Functional Limitations Info             SPECIAL CARE FACTORS FREQUENCY  PT (By licensed PT)     PT Frequency: 3x week              Contractures Contractures Info: Not present    Additional Factors Info  Code Status Code Status Info: full             Current Medications (04/01/2019):     Discharge Medications: Medication List    STOP taking these medications   guaiFENesin 600 MG 12 hr tablet Commonly known as: MUCINEX     TAKE these medications   clopidogrel 75 MG tablet Commonly known as: PLAVIX Take 75 mg by mouth daily.   doxycycline 100 MG tablet Commonly known as: VIBRA-TABS Take 1 tablet (100 mg total) by mouth 2 (two) times daily.   feeding supplement (ENSURE ENLIVE) Liqd Take 237 mLs by mouth 2 (two) times daily between meals.   furosemide 40 MG tablet Commonly known as: LASIX Take 0.5 tablets (20 mg total) by mouth daily. What changed: how much to take   hydrocerin Crea Apply 1 application topically 2 (two) times daily. Bilateral lower legs   lisinopril 5 MG  tablet Commonly known as: ZESTRIL Take 0.5 tablets (2.5 mg total) by mouth daily. What changed: how much to take   polyethylene glycol 17 g packet Commonly known as: MIRALAX / GLYCOLAX Take 17 g by mouth daily.   potassium chloride 10 MEQ tablet Commonly known as: K-DUR Take 10 mEq by mouth daily.   Rivaroxaban 15 & 20 MG Tbpk Follow package directions: Take one 15mg  tablet by mouth twice a day. On day 22, switch to one 20mg  tablet once a day. Take with food.   senna 8.6 MG tablet Commonly known as: SENOKOT Take 1 tablet by mouth 2 (two) times  daily.   simvastatin 20 MG tablet Commonly known as: ZOCOR Take 20 mg by mouth daily.   tamsulosin 0.4 MG Caps capsule Commonly known as: FLOMAX Take 0.4 mg by mouth at bedtime.   traMADol 50 MG tablet Commonly known as: ULTRAM Take 1 tablet (50 mg total) by mouth every 6 (six) hours as needed. What changed: reasons to take this   vitamin B-12 100 MCG tablet Commonly known as: CYANOCOBALAMIN Take 100 mcg by mouth daily.     Relevant Imaging Results:  Relevant Lab Results:   Additional Information  , RN

## 2019-04-01 NOTE — TOC Progression Note (Signed)
Transition of Care [facility]) - Progression Note    Patient Details  Name: Mark Bullock MRN: [id number] Date of Birth: 1928-11-18  Transition of Care [facility]) CM/SW Contact  Joaquin Courts, RN Phone Number: 04/01/2019, 3:00 PM  Clinical Narrative:  CM spoke with [facility] and informed the facility that patient will be discharged today and will be returning to the facility with a need for HHPT. Updated FL2, HHPT orders, and covid test results sent to facility per request. Patient to be transported by [facility]. CM reached out to patient's daughter to inform her of the discharge, no answer, voicemail left and awaiting return call.      Expected Discharge Plan: Assisted Living Barriers to Discharge: No Barriers Identified  Expected Discharge Plan and Services Expected Discharge Plan: Assisted Living In-house Referral: Clinical Social Work     Living arrangements for the past 2 months: [place] Expected Discharge Date: 04/01/19                                     Social Determinants of Health (SDOH) Interventions    Readmission Risk Interventions No flowsheet data found.

## 2019-04-03 LAB — CULTURE, BLOOD (ROUTINE X 2)
Culture: NO GROWTH
Culture: NO GROWTH
Special Requests: ADEQUATE
Special Requests: ADEQUATE

## 2019-09-19 ENCOUNTER — Emergency Department (HOSPITAL_COMMUNITY): Payer: Medicare Other

## 2019-09-19 ENCOUNTER — Inpatient Hospital Stay (HOSPITAL_COMMUNITY)
Admission: EM | Admit: 2019-09-19 | Discharge: 2019-09-25 | DRG: 602 | Disposition: A | Discharge status: Skilled Nursing Facility | Payer: Medicare Other | Attending: Internal Medicine | Admitting: Internal Medicine

## 2019-09-19 ENCOUNTER — Encounter (HOSPITAL_COMMUNITY): Payer: Self-pay | Admitting: Internal Medicine

## 2019-09-19 DIAGNOSIS — Z79899 Other long term (current) drug therapy: Secondary | ICD-10-CM

## 2019-09-19 DIAGNOSIS — R627 Adult failure to thrive: Secondary | ICD-10-CM | POA: Diagnosis present

## 2019-09-19 DIAGNOSIS — N183 Chronic kidney disease, stage 3 unspecified: Secondary | ICD-10-CM | POA: Diagnosis not present

## 2019-09-19 DIAGNOSIS — E785 Hyperlipidemia, unspecified: Secondary | ICD-10-CM | POA: Diagnosis present

## 2019-09-19 DIAGNOSIS — L03116 Cellulitis of left lower limb: Secondary | ICD-10-CM | POA: Diagnosis present

## 2019-09-19 DIAGNOSIS — Z7189 Other specified counseling: Secondary | ICD-10-CM | POA: Diagnosis not present

## 2019-09-19 DIAGNOSIS — I5022 Chronic systolic (congestive) heart failure: Secondary | ICD-10-CM | POA: Diagnosis present

## 2019-09-19 DIAGNOSIS — D631 Anemia in chronic kidney disease: Secondary | ICD-10-CM | POA: Diagnosis present

## 2019-09-19 DIAGNOSIS — K5900 Constipation, unspecified: Secondary | ICD-10-CM | POA: Diagnosis present

## 2019-09-19 DIAGNOSIS — Z8249 Family history of ischemic heart disease and other diseases of the circulatory system: Secondary | ICD-10-CM

## 2019-09-19 DIAGNOSIS — F0150 Vascular dementia without behavioral disturbance: Secondary | ICD-10-CM | POA: Diagnosis present

## 2019-09-19 DIAGNOSIS — Z86718 Personal history of other venous thrombosis and embolism: Secondary | ICD-10-CM

## 2019-09-19 DIAGNOSIS — E538 Deficiency of other specified B group vitamins: Secondary | ICD-10-CM | POA: Diagnosis present

## 2019-09-19 DIAGNOSIS — I878 Other specified disorders of veins: Secondary | ICD-10-CM | POA: Diagnosis present

## 2019-09-19 DIAGNOSIS — I5043 Acute on chronic combined systolic (congestive) and diastolic (congestive) heart failure: Secondary | ICD-10-CM | POA: Diagnosis present

## 2019-09-19 DIAGNOSIS — E1122 Type 2 diabetes mellitus with diabetic chronic kidney disease: Secondary | ICD-10-CM | POA: Diagnosis present

## 2019-09-19 DIAGNOSIS — I82402 Acute embolism and thrombosis of unspecified deep veins of left lower extremity: Secondary | ICD-10-CM | POA: Diagnosis not present

## 2019-09-19 DIAGNOSIS — L03115 Cellulitis of right lower limb: Secondary | ICD-10-CM | POA: Diagnosis present

## 2019-09-19 DIAGNOSIS — I82409 Acute embolism and thrombosis of unspecified deep veins of unspecified lower extremity: Secondary | ICD-10-CM | POA: Diagnosis present

## 2019-09-19 DIAGNOSIS — Z20822 Contact with and (suspected) exposure to covid-19: Secondary | ICD-10-CM | POA: Diagnosis present

## 2019-09-19 DIAGNOSIS — R609 Edema, unspecified: Secondary | ICD-10-CM

## 2019-09-19 DIAGNOSIS — N400 Enlarged prostate without lower urinary tract symptoms: Secondary | ICD-10-CM | POA: Diagnosis present

## 2019-09-19 DIAGNOSIS — Z7901 Long term (current) use of anticoagulants: Secondary | ICD-10-CM

## 2019-09-19 DIAGNOSIS — I130 Hypertensive heart and chronic kidney disease with heart failure and stage 1 through stage 4 chronic kidney disease, or unspecified chronic kidney disease: Secondary | ICD-10-CM | POA: Diagnosis present

## 2019-09-19 DIAGNOSIS — Z9114 Patient's other noncompliance with medication regimen: Secondary | ICD-10-CM | POA: Diagnosis not present

## 2019-09-19 DIAGNOSIS — Z86711 Personal history of pulmonary embolism: Secondary | ICD-10-CM

## 2019-09-19 DIAGNOSIS — N1831 Chronic kidney disease, stage 3a: Secondary | ICD-10-CM | POA: Diagnosis present

## 2019-09-19 DIAGNOSIS — R52 Pain, unspecified: Secondary | ICD-10-CM | POA: Diagnosis not present

## 2019-09-19 DIAGNOSIS — L03119 Cellulitis of unspecified part of limb: Secondary | ICD-10-CM | POA: Diagnosis not present

## 2019-09-19 DIAGNOSIS — L0390 Cellulitis, unspecified: Secondary | ICD-10-CM | POA: Diagnosis not present

## 2019-09-19 DIAGNOSIS — Z515 Encounter for palliative care: Secondary | ICD-10-CM | POA: Diagnosis present

## 2019-09-19 DIAGNOSIS — R531 Weakness: Secondary | ICD-10-CM | POA: Diagnosis present

## 2019-09-19 DIAGNOSIS — I69354 Hemiplegia and hemiparesis following cerebral infarction affecting left non-dominant side: Secondary | ICD-10-CM | POA: Diagnosis not present

## 2019-09-19 DIAGNOSIS — E119 Type 2 diabetes mellitus without complications: Secondary | ICD-10-CM | POA: Diagnosis not present

## 2019-09-19 DIAGNOSIS — I5023 Acute on chronic systolic (congestive) heart failure: Secondary | ICD-10-CM | POA: Diagnosis not present

## 2019-09-19 DIAGNOSIS — Z7902 Long term (current) use of antithrombotics/antiplatelets: Secondary | ICD-10-CM

## 2019-09-19 LAB — COMPREHENSIVE METABOLIC PANEL
ALT: 28 U/L (ref 0–44)
AST: 14 U/L — ABNORMAL LOW (ref 15–41)
Albumin: 3.2 g/dL — ABNORMAL LOW (ref 3.5–5.0)
Alkaline Phosphatase: 52 U/L (ref 38–126)
Anion gap: 9 (ref 5–15)
BUN: 17 mg/dL (ref 8–23)
CO2: 29 mmol/L (ref 22–32)
Calcium: 9 mg/dL (ref 8.9–10.3)
Chloride: 101 mmol/L (ref 98–111)
Creatinine, Ser: 1.21 mg/dL (ref 0.61–1.24)
GFR calc Af Amer: >60 mL/min (ref >60)
GFR calc non Af Amer: 53 mL/min — ABNORMAL LOW (ref >60)
Glucose, Bld: 129 mg/dL — ABNORMAL HIGH (ref 70–99)
Potassium: 3.8 mmol/L (ref 3.5–5.1)
Sodium: 139 mmol/L (ref 135–145)
Total Bilirubin: 2.3 mg/dL — ABNORMAL HIGH (ref 0.3–1.2)
Total Protein: 6.6 g/dL (ref 6.5–8.1)

## 2019-09-19 LAB — URINALYSIS, ROUTINE W REFLEX MICROSCOPIC
Bilirubin Urine: NEGATIVE
Glucose, UA: NEGATIVE mg/dL
Ketones, ur: NEGATIVE mg/dL
Leukocytes,Ua: NEGATIVE
Nitrite: NEGATIVE
Protein, ur: NEGATIVE mg/dL
RBC / HPF: >50 RBC/hpf — ABNORMAL HIGH (ref 0–5)
Specific Gravity, Urine: 1.011 (ref 1.005–1.030)
pH: 6 (ref 5.0–8.0)

## 2019-09-19 LAB — CBC WITH DIFFERENTIAL/PLATELET
Abs Immature Granulocytes: 0.02 10*3/uL (ref 0.00–0.07)
Basophils Absolute: 0 10*3/uL (ref 0.0–0.1)
Basophils Relative: 1 %
Eosinophils Absolute: 0 10*3/uL (ref 0.0–0.5)
Eosinophils Relative: 0 %
HCT: 36.7 % — ABNORMAL LOW (ref 39.0–52.0)
Hemoglobin: 11.5 g/dL — ABNORMAL LOW (ref 13.0–17.0)
Immature Granulocytes: 0 %
Lymphocytes Relative: 7 %
Lymphs Abs: 0.6 10*3/uL — ABNORMAL LOW (ref 0.7–4.0)
MCH: 28.2 pg (ref 26.0–34.0)
MCHC: 31.3 g/dL (ref 30.0–36.0)
MCV: 90 fL (ref 80.0–100.0)
Monocytes Absolute: 0.7 10*3/uL (ref 0.1–1.0)
Monocytes Relative: 9 %
Neutro Abs: 7 10*3/uL (ref 1.7–7.7)
Neutrophils Relative %: 83 %
Platelets: 220 10*3/uL (ref 150–400)
RBC: 4.08 MIL/uL — ABNORMAL LOW (ref 4.22–5.81)
RDW: 16.9 % — ABNORMAL HIGH (ref 11.5–15.5)
WBC: 8.4 10*3/uL (ref 4.0–10.5)
nRBC: 0 % (ref 0.0–0.2)

## 2019-09-19 LAB — GLUCOSE, CAPILLARY: Glucose-Capillary: 90 mg/dL (ref 70–99)

## 2019-09-19 LAB — LACTIC ACID, PLASMA: Lactic Acid, Venous: 1.9 mmol/L (ref 0.5–1.9)

## 2019-09-19 LAB — SEDIMENTATION RATE: Sed Rate: 26 mm/hr — ABNORMAL HIGH (ref 0–16)

## 2019-09-19 LAB — BRAIN NATRIURETIC PEPTIDE: B Natriuretic Peptide: 69 pg/mL (ref 0.0–100.0)

## 2019-09-19 MED ORDER — ACETAMINOPHEN 650 MG RE SUPP: 650.0000 mg | Freq: Four times a day (QID) | RECTAL | Status: DC | PRN

## 2019-09-19 MED ORDER — PIPERACILLIN-TAZOBACTAM 3.375 G IVPB 30 MIN
3.3750 g | Freq: Once | INTRAVENOUS | Status: AC
  Administered 2019-09-19: 19:00:00 3.375 g via INTRAVENOUS
  Filled 2019-09-19: qty 50

## 2019-09-19 MED ORDER — FUROSEMIDE 10 MG/ML IJ SOLN
20.0000 mg | Freq: Every day | INTRAMUSCULAR | Status: DC
  Administered 2019-09-20: 10:00:00 20 mg via INTRAVENOUS
  Filled 2019-09-19: qty 2

## 2019-09-19 MED ORDER — VITAMIN B-12 100 MCG PO TABS
100.0000 ug | ORAL_TABLET | Freq: Every day | ORAL | Status: DC
  Administered 2019-09-20 – 2019-09-25 (×5): 100 ug via ORAL
  Filled 2019-09-19 (×6): qty 1

## 2019-09-19 MED ORDER — CLOPIDOGREL BISULFATE 75 MG PO TABS
75.0000 mg | ORAL_TABLET | Freq: Every day | ORAL | Status: DC
  Administered 2019-09-20 – 2019-09-25 (×5): 75 mg via ORAL
  Filled 2019-09-19 (×5): qty 1

## 2019-09-19 MED ORDER — FUROSEMIDE 10 MG/ML IJ SOLN
20.0000 mg | Freq: Once | INTRAMUSCULAR | Status: AC
  Administered 2019-09-19: 21:00:00 20 mg via INTRAVENOUS
  Filled 2019-09-19: qty 4

## 2019-09-19 MED ORDER — ENSURE ENLIVE PO LIQD
237.0000 mL | Freq: Two times a day (BID) | ORAL | Status: DC
  Administered 2019-09-21 – 2019-09-25 (×3): 237 mL via ORAL

## 2019-09-19 MED ORDER — ONDANSETRON HCL 4 MG PO TABS
4.0000 mg | ORAL_TABLET | Freq: Four times a day (QID) | ORAL | Status: DC | PRN
  Filled 2019-09-19: qty 1

## 2019-09-19 MED ORDER — LISINOPRIL 5 MG PO TABS
2.5000 mg | ORAL_TABLET | Freq: Every day | ORAL | Status: DC
  Filled 2019-09-19: qty 1

## 2019-09-19 MED ORDER — ONDANSETRON HCL 4 MG/2ML IJ SOLN
4.0000 mg | Freq: Four times a day (QID) | INTRAMUSCULAR | Status: DC | PRN
  Administered 2019-09-24: 11:00:00 4 mg via INTRAVENOUS
  Filled 2019-09-19: qty 2

## 2019-09-19 MED ORDER — INSULIN ASPART 100 UNIT/ML ~~LOC~~ SOLN
0.0000 [IU] | Freq: Three times a day (TID) | SUBCUTANEOUS | Status: DC
  Administered 2019-09-23: 12:00:00 1 [IU] via SUBCUTANEOUS

## 2019-09-19 MED ORDER — TAMSULOSIN HCL 0.4 MG PO CAPS
0.4000 mg | ORAL_CAPSULE | Freq: Every day | ORAL | Status: DC
  Administered 2019-09-19 – 2019-09-24 (×3): 0.4 mg via ORAL
  Filled 2019-09-19 (×6): qty 1

## 2019-09-19 MED ORDER — ACETAMINOPHEN 325 MG PO TABS: 650.0000 mg | ORAL_TABLET | Freq: Four times a day (QID) | ORAL | Status: DC | PRN

## 2019-09-19 MED ORDER — VANCOMYCIN HCL 1500 MG/300ML IV SOLN
1500.0000 mg | Freq: Once | INTRAVENOUS | Status: AC
  Administered 2019-09-19: 1500 mg via INTRAVENOUS
  Filled 2019-09-19: qty 300

## 2019-09-19 MED ORDER — APIXABAN 5 MG PO TABS
5.0000 mg | ORAL_TABLET | Freq: Two times a day (BID) | ORAL | Status: DC
  Administered 2019-09-19 – 2019-09-25 (×9): 5 mg via ORAL
  Filled 2019-09-19 (×11): qty 1

## 2019-09-19 MED ORDER — SIMVASTATIN 10 MG PO TABS
20.0000 mg | ORAL_TABLET | Freq: Every day | ORAL | Status: DC
  Administered 2019-09-20 – 2019-09-25 (×5): 20 mg via ORAL
  Filled 2019-09-19 (×5): qty 2

## 2019-09-19 MED ORDER — SENNA 8.6 MG PO TABS
1.0000 | ORAL_TABLET | Freq: Two times a day (BID) | ORAL | Status: DC
  Administered 2019-09-19 – 2019-09-23 (×6): 8.6 mg via ORAL
  Filled 2019-09-19 (×8): qty 1

## 2019-09-19 MED ORDER — PIPERACILLIN-TAZOBACTAM 3.375 G IVPB
3.3750 g | Freq: Three times a day (TID) | INTRAVENOUS | Status: DC
  Administered 2019-09-20 – 2019-09-22 (×7): 3.375 g via INTRAVENOUS
  Filled 2019-09-19 (×9): qty 50

## 2019-09-19 MED ORDER — POLYETHYLENE GLYCOL 3350 17 G PO PACK
17.0000 g | PACK | Freq: Every day | ORAL | Status: DC
  Administered 2019-09-20 – 2019-09-25 (×4): 17 g via ORAL
  Filled 2019-09-19 (×5): qty 1

## 2019-09-19 MED ORDER — TRAMADOL HCL 50 MG PO TABS
50.0000 mg | ORAL_TABLET | Freq: Four times a day (QID) | ORAL | Status: DC | PRN
  Administered 2019-09-19 – 2019-09-20 (×2): 50 mg via ORAL
  Filled 2019-09-19 (×3): qty 1

## 2019-09-19 MED ORDER — POTASSIUM CHLORIDE CRYS ER 10 MEQ PO TBCR
10.0000 meq | EXTENDED_RELEASE_TABLET | Freq: Every day | ORAL | Status: DC
  Administered 2019-09-20 – 2019-09-25 (×5): 10 meq via ORAL
  Filled 2019-09-19 (×5): qty 1

## 2019-09-19 NOTE — H&P (Addendum)
History and Physical    Mark Bullock ELF:[id number] DOB: 1929/07/09 DOA: 09/19/2019  PCP: Burton Apley, MD  Patient coming from: [facility].  Chief Complaint: Increasing lower extremity edema and skin break.  HPI: Mark Bullock is a 86 y.o. male with history of pulmonary embolism DVT, chronic systolic heart failure last EF measured was 35 to 40%, diabetes mellitus type 2, stroke with left-sided weakness hypertension BPH B12 deficiency was brought to the ER after patient was found to have increasing swelling of the both lower extremity extending up to the thigh and scrotum with increasing skin breakdown concerning for cellulitis.  Per report patient has been not very cooperative with taking his medication.  Is not known how long he has not been taking his medicines.  ED Course: In the ER patient was afebrile and not hypoxic with chest x-ray showing some nonspecific findings.  On exam patient has significant lower extremity edema both lower extremities extending up to the thighs and there is significant skin breakdown in the both extremities pitting in the left side with a mid shin down involving the foot that is skin changes.  Patient is complaining of pain in the foot area.  Labs show hemoglobin 11.5 BNP 69 lactic acid 1.9 complete metabolic panel shows albumin 3.2.  Patient was started on empiric antibiotics and Lasix 20 mg IV admitted for further management.  Note that patient not very cooperative and not allowing good examination.  Review of Systems: As per HPI, rest all negative.   Past Medical History:  Diagnosis Date  . AKI (acute kidney injury) (HCC) 05/18/2017  . Diabetes mellitus without complication (HCC)   . Hyperlipidemia   . Hypertension   . Left-sided weakness   . Stroke [facility])     Past Surgical History:  Procedure Laterality Date  . APPENDECTOMY    . KNEE SURGERY       reports that he has never smoked. He has never used  smokeless tobacco. He reports that he does not drink alcohol or use drugs.  No Known Allergies  Family History  Family history unknown: Yes    Prior to Admission medications   Medication Sig Start Date End Date Taking? Authorizing Provider  apixaban (ELIQUIS) 5 MG TABS tablet Take 5 mg by mouth 2 (two) times daily.    [provider]  clopidogrel (PLAVIX) 75 MG tablet Take 75 mg by mouth daily. 01/22/15   [provider]  doxycycline (VIBRA-TABS) 100 MG tablet Take 1 tablet (100 mg total) by mouth 2 (two) times daily. 04/01/19   Dhungel, Nishant, MD  feeding supplement, ENSURE ENLIVE, (ENSURE ENLIVE) LIQD Take 237 mLs by mouth 2 (two) times daily between meals. 04/01/19   Dhungel, Theda [place], MD  furosemide (LASIX) 40 MG tablet Take 0.5 tablets (20 mg total) by mouth daily. 04/01/19   Dhungel, Nishant, MD  hydrocerin (EUCERIN) CREA Apply 1 application topically 2 (two) times daily. Bilateral lower legs 04/01/19   Dhungel, Nishant, MD  lisinopril (ZESTRIL) 5 MG tablet Take 0.5 tablets (2.5 mg total) by mouth daily. 04/01/19   Dhungel, Nishant, MD  polyethylene glycol (MIRALAX / GLYCOLAX) 17 g packet Take 17 g by mouth daily.    [provider]  potassium chloride (K-DUR) 10 MEQ tablet Take 10 mEq by mouth daily.  12/20/17   [provider]  Rivaroxaban 15 & 20 MG TBPK Follow package directions: Take one 15mg  tablet by mouth twice a day. On day 22, switch to  one 20mg  tablet once a day. Take with food. 04/01/19   Dhungel, 04/03/19, MD  senna (SENOKOT) 8.6 MG tablet Take 1 tablet by mouth 2 (two) times daily.    [provider]  simvastatin (ZOCOR) 20 MG tablet Take 20 mg by mouth daily. 01/22/15   [provider]  tamsulosin (FLOMAX) 0.4 MG CAPS capsule Take 0.4 mg by mouth at bedtime.    [provider]  traMADol (ULTRAM) 50 MG tablet Take 1 tablet (50 mg total) by mouth every 6 (six) hours as needed. Patient taking differently: Take 50 mg by  mouth every 6 (six) hours as needed for moderate pain or severe pain.  01/22/18   01/24/18, MD  vitamin B-12 (CYANOCOBALAMIN) 100 MCG tablet Take 100 mcg by mouth daily.    [provider]    Physical Exam: Constitutional: Moderately built and nourished. Vitals:   09/19/19 1845 09/19/19 1915 09/19/19 2000 09/19/19 2121  BP: 107/65 105/63 112/68 117/82  Pulse: 80 74 78 72  Resp: (!) 25 (!) 23 20 20   Temp:    99.1 F (37.3 C)  TempSrc:    Oral  SpO2: 98% 98% 100% 100%   Eyes: Anicteric no pallor. ENMT: No discharge from the ears eyes nose or mouth. Neck: No muscle.  No neck rigidity. Respiratory: No rhonchi or crepitations. Cardiovascular: S1-S2 heard. Abdomen: Soft nontender bowel sound present. Musculoskeletal: Bilateral lower extremity edema extending up to thighs. Skin: Bilateral lower extremity skin below the shin area looks quite macerated with chronic changes and admitted looks very dark.  Erythematous also. Neurologic: Alert awake oriented to name and place.  Moves all extremities. Psychiatric: Oriented to name and place.   Labs on Admission: I have personally reviewed following labs and imaging studies  CBC: Recent Labs  Lab 09/19/19 1749  WBC 8.4  NEUTROABS 7.0  HGB 11.5*  HCT 36.7*  MCV 90.0  PLT 220   Basic Metabolic Panel: Recent Labs  Lab 09/19/19 1749  NA 139  K 3.8  CL 101  CO2 29  GLUCOSE 129*  BUN 17  CREATININE 1.21  CALCIUM 9.0   GFR: CrCl cannot be calculated (Unknown ideal weight.). Liver Function Tests: Recent Labs  Lab 09/19/19 1749  AST 14*  ALT 28  ALKPHOS 52  BILITOT 2.3*  PROT 6.6  ALBUMIN 3.2*   No results for input(s): LIPASE, AMYLASE in the last 168 hours. No results for input(s): AMMONIA in the last 168 hours. Coagulation Profile: No results for input(s): INR, PROTIME in the last 168 hours. Cardiac Enzymes: No results for input(s): CKTOTAL, CKMB, CKMBINDEX, TROPONINI in the last 168 hours. BNP (last  3 results) No results for input(s): PROBNP in the last 8760 hours. HbA1C: No results for input(s): HGBA1C in the last 72 hours. CBG: Recent Labs  Lab 09/19/19 2158  GLUCAP 90   Lipid Profile: No results for input(s): CHOL, HDL, LDLCALC, TRIG, CHOLHDL, LDLDIRECT in the last 72 hours. Thyroid Function Tests: No results for input(s): TSH, T4TOTAL, FREET4, T3FREE, THYROIDAB in the last 72 hours. Anemia Panel: No results for input(s): VITAMINB12, FOLATE, FERRITIN, TIBC, IRON, RETICCTPCT in the last 72 hours. Urine analysis:    Component Value Date/Time   COLORURINE YELLOW 09/19/2019 1749   APPEARANCEUR HAZY (A) 09/19/2019 1749   LABSPEC 1.011 09/19/2019 1749   PHURINE 6.0 09/19/2019 1749   GLUCOSEU NEGATIVE 09/19/2019 1749   HGBUR LARGE (A) 09/19/2019 1749   BILIRUBINUR NEGATIVE 09/19/2019 1749   KETONESUR NEGATIVE 09/19/2019 1749  PROTEINUR NEGATIVE 09/19/2019 1749   UROBILINOGEN 1.0 01/16/2010 0200   NITRITE NEGATIVE 09/19/2019 1749   LEUKOCYTESUR NEGATIVE 09/19/2019 1749   Sepsis Labs: @LABRCNTIP (procalcitonin:4,lacticidven:4) )No results found for this or any previous visit (from the past 240 hour(s)).   Radiological Exams on Admission: DG Chest Port 1 View  Result Date: 09/19/2019 CLINICAL DATA:  Dyspnea. Leg swelling. EXAM: PORTABLE CHEST 1 VIEW COMPARISON:  Radiograph 03/28/2019. CT 03/30/2019 FINDINGS: Chronic elevation of right hemidiaphragm. Possible small right pleural effusion. Unchanged heart size and mediastinal contours. No pulmonary edema, focal airspace disease, or pneumothorax. Chronic change of both shoulders. No acute osseous abnormalities are seen. IMPRESSION: Chronic elevation of right hemidiaphragm with small right pleural effusion. Electronically Signed   By: Keith Rake M.D.   On: 09/19/2019 18:35   DG Foot 2 Views Left  Result Date: 09/19/2019 CLINICAL DATA:  Dyspnea. Patient has bilateral foot necrosis per technologist note. EXAM: LEFT FOOT - 2  VIEW COMPARISON:  None. FINDINGS: AP views are limited by positioning, patient had difficulty tolerating the exam. Hammertoe deformity of the digits limits assessment. No obvious bony destruction or periosteal reaction. Small plantar calcaneal spur and Achilles tendon enthesophyte. Soft tissue edema is most prominent about the dorsum of the foot. No soft tissue air or radiopaque foreign body. IMPRESSION: Soft tissue edema, most prominent about the dorsum of the foot. No soft tissue air. No radiographic evidence of osteomyelitis, however exam is limited by positioning. Electronically Signed   By: Keith Rake M.D.   On: 09/19/2019 18:37     Assessment/Plan Principal Problem:   Cellulitis of both lower extremities Active Problems:   CKD (chronic kidney disease), stage III   Type II diabetes mellitus with stage 3 chronic kidney disease (HCC)   Vascular dementia without behavioral disturbance (HCC)   Chronic systolic CHF (congestive heart failure) (HCC)   Leg DVT (deep venous thromboembolism), acute, left (HCC)   Cellulitis    1. Bilateral lower remedy cellulitis more on the left side for which patient is on empiric antibiotics.  Will get ABI.  Wound team consult and also MRI of the left foot. 2. Acute on chronic systolic heart failure last EF measured was 30 to 35% in 2020.  Presently on low-dose Lasix.  Closely monitor intake output.  Patient is also on lisinopril. 3. Diabetes mellitus type 2 we will keep patient on sliding scale coverage. 4. History of DVT PE on apixaban. 5. Chronic kidney disease stage III creatinine appears to be at baseline. 6. Hyperlipidemia on statins. 7. Chronic anemia could be from renal disease follow CBC.  Hemoglobin appears to be at baseline.  Patient is also on B12 supplements.  Given that patient has worsening edema of the lower extremities with cellulitis which is will need close monitoring for any further deterioration and inpatient status.   DVT  prophylaxis: Apixaban. Code Status: Full code. Family Communication: We will need to discuss with family. Disposition Plan: To be determined. Consults called: Since patient has been refusing care at the nursing facility will get palliative care. Admission status: Inpatient.   Rise Patience MD [facility] Pager [phone].  If 7PM-7AM, please contact night-coverage [email or web address] Password [facility]  09/19/2019, 10:16 PM

## 2019-09-19 NOTE — Progress Notes (Signed)
A consult was received from an ED physician for zosyn per pharmacy dosing.  The patient's profile has been reviewed for ht/wt/allergies/indication/available labs.    A one time order has been placed for zosyn 3.375 gm IV x 1 dose.    Further antibiotics/pharmacy consults should be ordered by admitting physician if indicated.                       Thank you, Herby Abraham, Pharm.D [phone] 09/19/2019 5:57 PM

## 2019-09-19 NOTE — ED Notes (Addendum)
Called pt's daughter for him using portable phone.  Will check back to retrieve phone.  Also advised pt that urine sample is needed and provided him a urinal.

## 2019-09-19 NOTE — ED Provider Notes (Signed)
[facility]-EMERGENCY DEPT Provider Note   CSN: [id number] Arrival date & time: 09/19/19  1715     History Chief Complaint  Patient presents with  . Necrotic Foot    Bilateral foot necrosis    Mark Bullock is a 86 y.o. male.  86 year old male with prior medical history as detailed below presents for evaluation of swelling of his left leg with associated pain.  Patient reports worsening edema of both lower extremities.  He reports increased erythema and pain to the left foot.  He denies fever associated with same.  He denies chest pain or shortness of breath.  He reports full compliance with his regular medications.  Of note, patient's staff at [facility] report that the patient has been noncompliant with medications.  He reportedly has refused assistance in care.  Patient denies this.  The history is provided by the patient and medical records.  Illness Location:  Left foot pain and swelling  Severity:  Moderate Onset quality:  Gradual Duration: uncertain  Timing:  Constant Progression:  Worsening Chronicity:  New Associated symptoms: no fever        Past Medical History:  Diagnosis Date  . AKI (acute kidney injury) (HCC) 05/18/2017  . Diabetes mellitus without complication (HCC)   . Hyperlipidemia   . Hypertension   . Left-sided weakness   . Stroke [facility])     Patient Active Problem List   Diagnosis Date Noted  . Chronic systolic CHF (congestive heart failure) (HCC) 03/30/2019  . Leg DVT (deep venous thromboembolism), acute, left (HCC) 03/30/2019  . Acute pulmonary embolism without acute cor pulmonale (HCC) 03/30/2019  . Cellulitis of leg, left 03/29/2019  . Chronic cerebrovascular accident (CVA) 07/07/2017  . Essential hypertension, benign 05/24/2017  . Type II diabetes mellitus with stage 3 chronic kidney disease (HCC) 05/24/2017  . Dyslipidemia associated with type 2 diabetes mellitus (HCC) 05/24/2017  . Vascular dementia without  behavioral disturbance (HCC) 05/24/2017  . Vitamin B 12 deficiency 05/24/2017  . Rhabdomyolysis 05/19/2017  . AKI (acute kidney injury) (HCC) 05/18/2017  . Elevated troponin 05/18/2017  . History of completed stroke 05/18/2017  . CKD (chronic kidney disease), stage III 05/18/2017  . Physical deconditioning 05/08/2017  . Osteoarthritis 05/08/2017  . Bilateral lower extremity edema   . Abnormal EKG   . Elevated brain natriuretic peptide (BNP) level     Past Surgical History:  Procedure Laterality Date  . APPENDECTOMY    . KNEE SURGERY         Family History  Family history unknown: Yes    Social History   Tobacco Use  . Smoking status: Never Smoker  . Smokeless tobacco: Never Used  Substance Use Topics  . Alcohol use: No  . Drug use: No    Home Medications Prior to Admission medications   Medication Sig Start Date End Date Taking? Authorizing Provider  clopidogrel (PLAVIX) 75 MG tablet Take 75 mg by mouth daily. 01/22/15   [provider]  doxycycline (VIBRA-TABS) 100 MG tablet Take 1 tablet (100 mg total) by mouth 2 (two) times daily. 04/01/19   Dhungel, Nishant, MD  feeding supplement, ENSURE ENLIVE, (ENSURE ENLIVE) LIQD Take 237 mLs by mouth 2 (two) times daily between meals. 04/01/19   Dhungel, Theda Belfast, MD  furosemide (LASIX) 40 MG tablet Take 0.5 tablets (20 mg total) by mouth daily. 04/01/19   Dhungel, Nishant, MD  hydrocerin (EUCERIN) CREA Apply 1 application topically 2 (two) times daily. Bilateral lower legs 04/01/19  Dhungel, Nishant, MD  lisinopril (ZESTRIL) 5 MG tablet Take 0.5 tablets (2.5 mg total) by mouth daily. 04/01/19   Dhungel, Nishant, MD  polyethylene glycol (MIRALAX / GLYCOLAX) 17 g packet Take 17 g by mouth daily.    [provider]  potassium chloride (K-DUR) 10 MEQ tablet Take 10 mEq by mouth daily.  12/20/17   [provider]  Rivaroxaban 15 & 20 MG TBPK Follow package directions: Take one 15mg  tablet by mouth twice a day.  On day 22, switch to one 20mg  tablet once a day. Take with food. 04/01/19   Dhungel, , MD  senna (SENOKOT) 8.6 MG tablet Take 1 tablet by mouth 2 (two) times daily.    [provider]  simvastatin (ZOCOR) 20 MG tablet Take 20 mg by mouth daily. 01/22/15   [provider]  tamsulosin (FLOMAX) 0.4 MG CAPS capsule Take 0.4 mg by mouth at bedtime.    [provider]  traMADol (ULTRAM) 50 MG tablet Take 1 tablet (50 mg total) by mouth every 6 (six) hours as needed. Patient taking differently: Take 50 mg by mouth every 6 (six) hours as needed for moderate pain or severe pain.  01/22/18   01/24/15, MD  vitamin B-12 (CYANOCOBALAMIN) 100 MCG tablet Take 100 mcg by mouth daily.    [provider]    Allergies    Patient has no known allergies.  Review of Systems   Review of Systems  Constitutional: Negative for fever.  All other systems reviewed and are negative.   Physical Exam Updated Vital Signs SpO2 99%   Physical Exam Vitals and nursing note reviewed.  Constitutional:      General: He is not in acute distress.    Appearance: He is well-developed.  HENT:     Head: Normocephalic and atraumatic.  Eyes:     Conjunctiva/sclera: Conjunctivae normal.     Pupils: Pupils are equal, round, and reactive to light.  Cardiovascular:     Rate and Rhythm: Normal rate and regular rhythm.     Heart sounds: Normal heart sounds.  Pulmonary:     Effort: Pulmonary effort is normal. No respiratory distress.     Breath sounds: Normal breath sounds.  Abdominal:     General: There is no distension.     Palpations: Abdomen is soft.     Tenderness: There is no abdominal tenderness.  Musculoskeletal:        General: No deformity. Normal range of motion.     Cervical back: Normal range of motion and neck supple.  Skin:    General: Skin is warm.     Comments: Brawny edema noted to both lower extremities.  Left foot is diffusely erythematous with secretions  and foul odor.   Neurological:     Mental Status: He is alert and oriented to person, place, and time.     ED Results / Procedures / Treatments   Labs (all labs ordered are listed, but only abnormal results are displayed) Labs Reviewed  COMPREHENSIVE METABOLIC PANEL - Abnormal; Notable for the following components:      Result Value   Glucose, Bld 129 (*)    Albumin 3.2 (*)    AST 14 (*)    Total Bilirubin 2.3 (*)    GFR calc non Af Amer 53 (*)    All other components within normal limits  CBC WITH DIFFERENTIAL/PLATELET - Abnormal; Notable for the following components:   RBC 4.08 (*)    Hemoglobin 11.5 (*)  HCT 36.7 (*)    RDW 16.9 (*)    Lymphs Abs 0.6 (*)    All other components within normal limits  CULTURE, BLOOD (ROUTINE X 2)  CULTURE, BLOOD (ROUTINE X 2)  SARS CORONAVIRUS 2 (TAT 6-24 HRS)  LACTIC ACID, PLASMA  BRAIN NATRIURETIC PEPTIDE  LACTIC ACID, PLASMA  URINALYSIS, ROUTINE W REFLEX MICROSCOPIC  SEDIMENTATION RATE    EKG None  Radiology DG Chest [place] 1 View  Result Date: 09/19/2019 CLINICAL DATA:  Dyspnea. Leg swelling. EXAM: PORTABLE CHEST 1 VIEW COMPARISON:  Radiograph 03/28/2019. CT 03/30/2019 FINDINGS: Chronic elevation of right hemidiaphragm. Possible small right pleural effusion. Unchanged heart size and mediastinal contours. No pulmonary edema, focal airspace disease, or pneumothorax. Chronic change of both shoulders. No acute osseous abnormalities are seen. IMPRESSION: Chronic elevation of right hemidiaphragm with small right pleural effusion. Electronically Signed   By: Keith Rake M.D.   On: 09/19/2019 18:35   DG Foot 2 Views Left  Result Date: 09/19/2019 CLINICAL DATA:  Dyspnea. Patient has bilateral foot necrosis per technologist note. EXAM: LEFT FOOT - 2 VIEW COMPARISON:  None. FINDINGS: AP views are limited by positioning, patient had difficulty tolerating the exam. Hammertoe deformity of the digits limits assessment. No obvious bony  destruction or periosteal reaction. Small plantar calcaneal spur and Achilles tendon enthesophyte. Soft tissue edema is most prominent about the dorsum of the foot. No soft tissue air or radiopaque foreign body. IMPRESSION: Soft tissue edema, most prominent about the dorsum of the foot. No soft tissue air. No radiographic evidence of osteomyelitis, however exam is limited by positioning. Electronically Signed   By: Keith Rake M.D.   On: 09/19/2019 18:37    Procedures Procedures (including critical care time)  Medications Ordered in ED Medications - No data to display  ED Course  I have reviewed the triage vital signs and the nursing notes.  Pertinent labs & imaging results that were available during my care of the patient were reviewed by me and considered in my medical decision making (see chart for details).    MDM Rules/Calculators/A&P                      MDM  Screen complete  Socrates Cahoon Febo was evaluated in Emergency Department on 09/19/2019 for the symptoms described in the history of present illness. He was evaluated in the context of the global COVID-19 pandemic, which necessitated consideration that the patient might be at risk for infection with the SARS-CoV-2 virus that causes COVID-19. Institutional protocols and algorithms that pertain to the evaluation of patients at risk for COVID-19 are in a state of rapid change based on information released by regulatory bodies including the CDC and federal and state organizations. These policies and algorithms were followed during the patient's care in the ED.  Patient is presenting for evaluation of increased edema to both lower extremities and likely left foot cellulitis.  Patient will be admitted for IV antibiotics and diuresis.  Patient's condition may be secondary to reported possible nonmedical noncompliance.  Hospitalist service is aware of case and evaluate for admission.   Final Clinical Impression(s) / ED  Diagnoses Final diagnoses:  Cellulitis of foot  Edema, unspecified type    Rx / DC Orders ED Discharge Orders    None       Valarie Merino, MD 09/19/19 2007

## 2019-09-19 NOTE — ED Triage Notes (Signed)
Arrived by EMS from [place] [facility]. EMS reports patient has bilateral foot necrosis, bilateral leg swelling up to thighs, and scrotal swelling. Facility reports patient has been refusing care and medications.

## 2019-09-20 ENCOUNTER — Inpatient Hospital Stay (HOSPITAL_COMMUNITY): Payer: Medicare Other

## 2019-09-20 ENCOUNTER — Other Ambulatory Visit: Payer: Self-pay

## 2019-09-20 DIAGNOSIS — R52 Pain, unspecified: Secondary | ICD-10-CM

## 2019-09-20 DIAGNOSIS — I5022 Chronic systolic (congestive) heart failure: Secondary | ICD-10-CM

## 2019-09-20 DIAGNOSIS — N1831 Chronic kidney disease, stage 3a: Secondary | ICD-10-CM

## 2019-09-20 LAB — GLUCOSE, CAPILLARY
Glucose-Capillary: 106 mg/dL — ABNORMAL HIGH (ref 70–99)
Glucose-Capillary: 117 mg/dL — ABNORMAL HIGH (ref 70–99)
Glucose-Capillary: 95 mg/dL (ref 70–99)
Glucose-Capillary: 175 mg/dL — ABNORMAL HIGH (ref 70–99)

## 2019-09-20 LAB — CBC
HCT: 33 % — ABNORMAL LOW (ref 39.0–52.0)
Hemoglobin: 10.4 g/dL — ABNORMAL LOW (ref 13.0–17.0)
MCH: 28.2 pg (ref 26.0–34.0)
MCHC: 31.5 g/dL (ref 30.0–36.0)
MCV: 89.4 fL (ref 80.0–100.0)
Platelets: 194 10*3/uL (ref 150–400)
RBC: 3.69 MIL/uL — ABNORMAL LOW (ref 4.22–5.81)
RDW: 16.8 % — ABNORMAL HIGH (ref 11.5–15.5)
WBC: 6.9 10*3/uL (ref 4.0–10.5)
nRBC: 0 % (ref 0.0–0.2)

## 2019-09-20 LAB — ECHOCARDIOGRAM COMPLETE: Weight: 2970.04 oz

## 2019-09-20 LAB — BASIC METABOLIC PANEL
Anion gap: 7 (ref 5–15)
BUN: 17 mg/dL (ref 8–23)
CO2: 26 mmol/L (ref 22–32)
Calcium: 8.4 mg/dL — ABNORMAL LOW (ref 8.9–10.3)
Chloride: 104 mmol/L (ref 98–111)
Creatinine, Ser: 1.01 mg/dL (ref 0.61–1.24)
GFR calc Af Amer: >60 mL/min (ref >60)
GFR calc non Af Amer: >60 mL/min (ref >60)
Glucose, Bld: 136 mg/dL — ABNORMAL HIGH (ref 70–99)
Potassium: 3.8 mmol/L (ref 3.5–5.1)
Sodium: 137 mmol/L (ref 135–145)

## 2019-09-20 LAB — SARS CORONAVIRUS 2 (TAT 6-24 HRS): SARS Coronavirus 2: NEGATIVE

## 2019-09-20 MED ORDER — VANCOMYCIN HCL 1250 MG/250ML IV SOLN
1250.0000 mg | INTRAVENOUS | Status: DC
  Administered 2019-09-20 – 2019-09-21 (×2): 1250 mg via INTRAVENOUS
  Filled 2019-09-20 (×2): qty 250

## 2019-09-20 MED ORDER — FUROSEMIDE 10 MG/ML IJ SOLN
20.0000 mg | Freq: Two times a day (BID) | INTRAMUSCULAR | Status: DC
  Administered 2019-09-21 – 2019-09-24 (×7): 20 mg via INTRAVENOUS
  Filled 2019-09-20 (×8): qty 2

## 2019-09-20 NOTE — Progress Notes (Signed)
[facility]                                                                              Patient Demographics  Mark Bullock, is a 86 y.o. male, DOB - 21-Aug-1928, VEL:[id number]  Admit date - 09/19/2019   Admitting Physician Eduard Clos, MD  Outpatient Primary MD for the patient is Burton Apley, MD  Outpatient specialists:   LOS - 1  days   Medical records reviewed and are as summarized below:    Chief Complaint  Patient presents with  . Necrotic Foot    Bilateral foot necrosis       Brief summary   Patient is a 86 year old male with history of PE, DVT, chronic systolic CHF, EF 35 to 40%, diabetes mellitus type 2, history of CVA with left-sided weakness, hypertension, BPH, B12 deficiency presented to ED after found to have increasing swelling of the both lower extremities extending up to the thigh and scrotum, increasing skin breakdown concerning for cellulitis.  Per report, patient has not been very cooperative with taking his medications.  He is from [facility]. Patient was admitted for acute on chronicCHF exacerbation, cellulitis of the lower extremities   Assessment & Plan    Principal Problem:   Cellulitis of both lower extremities -Patient appears to have some superimposed bacterial infection on chronic venous stasis and skin breakdown. -MRI of the left foot pending, wound care consulted, will also check ABI -Patient placed on IV vancomycin and Zosyn (history of uncontrolled diabetes mellitus), will narrow antibiotics in 24 hours  Active Problems: Acute on chronic combined systolic and diastolic CHF exacerbation -Previous 2D echo 03/2019 had shown EF of 35 to 40%, impaired relaxation LV diastolic filling, noncompliance with Lasix -Placed on IV Lasix 20 mg every 12 hours today, follow renal function closely -Continue strict I's and O's and daily weights, negative balance of 1.3 L -On lisinopril, will hold to  allow diuresis  Mild acute on CKD (chronic kidney disease), stage IIIa -Baseline 0.8, presented with creatinine of 1.2 -Continue diuresis, follow renal function closely     Type II diabetes mellitus with stage 3 chronic kidney disease (HCC) -CBGs currently controlled -Hemoglobin A1c 5.5 in 03/2019, will repeat -Continue sliding scale insulin  Constipation -Received enema, continue MiraLAX daily  BPH -Continue Flomax  History of DVT (deep venous thromboembolism), PE -Continue apixaban  Hyperlipidemia Continue statin  Anemia of chronic disease likely due to CKD -H&H currently at baseline   Code Status: Full CODE STATUS DVT Prophylaxis: Apixaban Family Communication: Discussed all imaging results, lab results, explained to the patient    Disposition Plan: Patient from independent living facility.  Anticipated discharge to ALF, pending PT evaluation, currently on IV antibiotics, IV diuresis precludes safe discharge back to facility   Time Spent in minutes 35 minutes  Procedures:  None  Consultants:   None  Antimicrobials:   Anti-infectives (From admission, onward)   Start     Dose/Rate Route Frequency Ordered Stop   09/20/19 2200  vancomycin (VANCOREADY) IVPB 1250 mg/250 mL     1,250 mg 166.7 mL/hr over 90 Minutes Intravenous Every 24  hours 09/20/19 0404     09/20/19 0000  piperacillin-tazobactam (ZOSYN) IVPB 3.375 g     3.375 g 12.5 mL/hr over 240 Minutes Intravenous Every 8 hours 09/19/19 2234     09/19/19 2245  vancomycin (VANCOREADY) IVPB 1500 mg/300 mL     1,500 mg 150 mL/hr over 120 Minutes Intravenous  Once 09/19/19 2234 09/20/19 0137   09/19/19 1800  piperacillin-tazobactam (ZOSYN) IVPB 3.375 g     3.375 g 100 mL/hr over 30 Minutes Intravenous  Once 09/19/19 1753 09/19/19 2048          Medications  Scheduled Meds: . apixaban  5 mg Oral BID  . clopidogrel  75 mg Oral Daily  . feeding supplement (ENSURE ENLIVE)  237 mL Oral BID BM  .  furosemide  20 mg Intravenous Daily  . insulin aspart  0-9 Units Subcutaneous TID WC  . lisinopril  2.5 mg Oral Daily  . polyethylene glycol  17 g Oral Daily  . potassium chloride  10 mEq Oral Daily  . senna  1 tablet Oral BID  . simvastatin  20 mg Oral Daily  . tamsulosin  0.4 mg Oral QHS  . vitamin B-12  100 mcg Oral Daily   Continuous Infusions: . piperacillin-tazobactam (ZOSYN)  IV 3.375 g (09/20/19 0722)  . vancomycin     PRN Meds:.acetaminophen **OR** acetaminophen, ondansetron **OR** ondansetron (ZOFRAN) IV, traMADol      Subjective:   [name] was seen and examined today.  Denies any specific complaints, difficult to obtain review of system from the patient, has some underlying dementia.  Denies any pain.  Patient denies dizziness, chest pain, shortness of breath, abdominal pain. + Constipation, still significant lower extremity edema.  No visible shortness of breath  Objective:   Vitals:   09/19/19 2000 09/19/19 2121 09/19/19 2300 09/20/19 0601  BP: 112/68 117/82  (!) 92/57  Pulse: 78 72  69  Resp: 20 20  19   Temp:  99.1 F (37.3 C)  98.5 F (36.9 C)  TempSrc:  Oral  Oral  SpO2: 100% 100%  99%  Weight:   84.1 kg 84.2 kg    Intake/Output Summary (Last 24 hours) at 09/20/2019 1012 Last data filed at 09/20/2019 6834 Gross per 24 hour  Intake --  Output 1300 ml  Net -1300 ml     Wt Readings from Last 3 Encounters:  09/20/19 84.2 kg  04/01/19 87.5 kg  01/18/18 103.4 kg     Exam  General: Alert and oriented x self and place  Cardiovascular: S1 S2 auscultated, no murmurs, RRR  Respiratory: Clear to auscultation bilaterally, no wheezing, rales or rhonchi  Gastrointestinal: Soft, nontender, nondistended, + bowel sounds  Ext: 2+ pedal edema bilaterally extending up to thighs  Neuro: Moving all 4 extremities  Musculoskeletal: No digital cyanosis, clubbing  Skin: Bilateral lower extremities with chronic venous stasis changes, erythematous,  skin breakdown  Psych: Dementia   Data Reviewed:  I have personally reviewed following labs and imaging studies  Micro Results Recent Results (from the past 240 hour(s))  SARS CORONAVIRUS 2 (TAT 6-24 HRS) Nasopharyngeal Nasopharyngeal Swab     Status: None   Collection Time: 09/19/19  5:50 PM   Specimen: Nasopharyngeal Swab  Result Value Ref Range Status   SARS Coronavirus 2 NEGATIVE NEGATIVE Final    Comment: (NOTE) SARS-CoV-2 target nucleic acids are NOT DETECTED. The SARS-CoV-2 RNA is generally detectable in upper and lower respiratory specimens during the acute phase of infection. Negative results do  not preclude SARS-CoV-2 infection, do not rule out co-infections with other pathogens, and should not be used as the sole basis for treatment or other patient management decisions. Negative results must be combined with clinical observations, patient history, and epidemiological information. The expected result is Negative. Fact Sheet for Patients: HairSlick.no Fact Sheet for Healthcare Providers: quierodirigir.com This test is not yet approved or cleared by the [place] FDA and  has been authorized for detection and/or diagnosis of SARS-CoV-2 by FDA under an Emergency Use Authorization (EUA). This EUA will remain  in effect (meaning this test can be used) for the duration of the COVID-19 declaration under Section 56 4(b)(1) of the Act, 21 U.S.C. section 360bbb-3(b)(1), unless the authorization is terminated or revoked sooner. Performed at [facility], [address] [place], [place] [address]     Radiology Reports DG Chest [place] 1 View  Result Date: 09/19/2019 CLINICAL DATA:  Dyspnea. Leg swelling. EXAM: PORTABLE CHEST 1 VIEW COMPARISON:  Radiograph 03/28/2019. CT 03/30/2019 FINDINGS: Chronic elevation of right hemidiaphragm. Possible small right pleural effusion. Unchanged heart size and mediastinal  contours. No pulmonary edema, focal airspace disease, or pneumothorax. Chronic change of both shoulders. No acute osseous abnormalities are seen. IMPRESSION: Chronic elevation of right hemidiaphragm with small right pleural effusion. Electronically Signed   By: Narda Rutherford M.D.   On: 09/19/2019 18:35   DG Foot 2 Views Left  Result Date: 09/19/2019 CLINICAL DATA:  Dyspnea. Patient has bilateral foot necrosis per technologist note. EXAM: LEFT FOOT - 2 VIEW COMPARISON:  None. FINDINGS: AP views are limited by positioning, patient had difficulty tolerating the exam. Hammertoe deformity of the digits limits assessment. No obvious bony destruction or periosteal reaction. Small plantar calcaneal spur and Achilles tendon enthesophyte. Soft tissue edema is most prominent about the dorsum of the foot. No soft tissue air or radiopaque foreign body. IMPRESSION: Soft tissue edema, most prominent about the dorsum of the foot. No soft tissue air. No radiographic evidence of osteomyelitis, however exam is limited by positioning. Electronically Signed   By: Narda Rutherford M.D.   On: 09/19/2019 18:37    Lab Data:  CBC: Recent Labs  Lab 09/19/19 1749 09/20/19 0432  WBC 8.4 6.9  NEUTROABS 7.0  --   HGB 11.5* 10.4*  HCT 36.7* 33.0*  MCV 90.0 89.4  PLT 220 194   Basic Metabolic Panel: Recent Labs  Lab 09/19/19 1749 09/20/19 0432  NA 139 137  K 3.8 3.8  CL 101 104  CO2 29 26  GLUCOSE 129* 136*  BUN 17 17  CREATININE 1.21 1.01  CALCIUM 9.0 8.4*   GFR: Estimated Creatinine Clearance: 53.4 mL/min (by C-G formula based on SCr of 1.01 mg/dL). Liver Function Tests: Recent Labs  Lab 09/19/19 1749  AST 14*  ALT 28  ALKPHOS 52  BILITOT 2.3*  PROT 6.6  ALBUMIN 3.2*   No results for input(s): LIPASE, AMYLASE in the last 168 hours. No results for input(s): AMMONIA in the last 168 hours. Coagulation Profile: No results for input(s): INR, PROTIME in the last 168 hours. Cardiac Enzymes: No  results for input(s): CKTOTAL, CKMB, CKMBINDEX, TROPONINI in the last 168 hours. BNP (last 3 results) No results for input(s): PROBNP in the last 8760 hours. HbA1C: No results for input(s): HGBA1C in the last 72 hours. CBG: Recent Labs  Lab 09/19/19 2158 09/20/19 0759  GLUCAP 90 106*   Lipid Profile: No results for input(s): CHOL, HDL, LDLCALC, TRIG, CHOLHDL, LDLDIRECT in the last  72 hours. Thyroid Function Tests: No results for input(s): TSH, T4TOTAL, FREET4, T3FREE, THYROIDAB in the last 72 hours. Anemia Panel: No results for input(s): VITAMINB12, FOLATE, FERRITIN, TIBC, IRON, RETICCTPCT in the last 72 hours. Urine analysis:    Component Value Date/Time   COLORURINE YELLOW 09/19/2019 1749   APPEARANCEUR HAZY (A) 09/19/2019 1749   LABSPEC 1.011 09/19/2019 1749   PHURINE 6.0 09/19/2019 1749   GLUCOSEU NEGATIVE 09/19/2019 1749   HGBUR LARGE (A) 09/19/2019 1749   BILIRUBINUR NEGATIVE 09/19/2019 1749   KETONESUR NEGATIVE 09/19/2019 1749   PROTEINUR NEGATIVE 09/19/2019 1749   UROBILINOGEN 1.0 01/16/2010 0200   NITRITE NEGATIVE 09/19/2019 1749   LEUKOCYTESUR NEGATIVE 09/19/2019 1749     Ariyel Jeangilles M.D. [facility] 09/20/2019, 10:12 AM   Call night coverage person covering after 7pm

## 2019-09-20 NOTE — Progress Notes (Signed)
Echocardiogram 2D Echocardiogram has been performed.  Mark Bullock 09/20/2019, 2:57 PM

## 2019-09-20 NOTE — Progress Notes (Signed)
Pharmacy Antibiotic Note  Mark Bullock is a 86 y.o. male admitted on 09/19/2019 with Cellulitis.  Pharmacy has been consulted for Vancomycin and Zosyn dosing.  Plan: Zosyn 3.375 gm IV x 1, then Zosyn 3.375g IV Q8H infused over 4hrs.   Vancomycin 1.5gm iv x1, then Vancomycin 1250 mg IV Q 24 hrs. Goal AUC 400-550. Expected AUC: 539 SCr used: 1.21   Weight: 185 lb 6.5 oz (84.1 kg)  Temp (24hrs), Avg:98.9 F (37.2 C), Min:98.6 F (37 C), Max:99.1 F (37.3 C)  Recent Labs  Lab 09/19/19 1749  WBC 8.4  CREATININE 1.21  LATICACIDVEN 1.9    Estimated Creatinine Clearance: 44.6 mL/min (by C-G formula based on SCr of 1.21 mg/dL).    No Known Allergies  Antimicrobials this admission: Vancomycin 09/20/2019 >> Zosyn 09/20/2019 >>   Dose adjustments this admission: -  Microbiology results: -  Thank you for allowing pharmacy to be a part of this patient's care.  Mark Bullock 09/20/2019 4:00 AM

## 2019-09-20 NOTE — Progress Notes (Signed)
Pt request enema states has trouble passing gas and passing BM, request enema. This RN paged. Dr. Modena Nunnery to inform.

## 2019-09-20 NOTE — Progress Notes (Signed)
ABI has been completed.   Preliminary results in CV Proc.   Blanch Media 09/20/2019 1:43 PM

## 2019-09-21 DIAGNOSIS — N183 Chronic kidney disease, stage 3 unspecified: Secondary | ICD-10-CM

## 2019-09-21 DIAGNOSIS — Z515 Encounter for palliative care: Secondary | ICD-10-CM

## 2019-09-21 DIAGNOSIS — L03119 Cellulitis of unspecified part of limb: Secondary | ICD-10-CM

## 2019-09-21 DIAGNOSIS — F0150 Vascular dementia without behavioral disturbance: Secondary | ICD-10-CM

## 2019-09-21 LAB — GLUCOSE, CAPILLARY
Glucose-Capillary: 77 mg/dL (ref 70–99)
Glucose-Capillary: 89 mg/dL (ref 70–99)
Glucose-Capillary: 92 mg/dL (ref 70–99)
Glucose-Capillary: 126 mg/dL — ABNORMAL HIGH (ref 70–99)

## 2019-09-21 LAB — CBC
HCT: 33.6 % — ABNORMAL LOW (ref 39.0–52.0)
Hemoglobin: 10.5 g/dL — ABNORMAL LOW (ref 13.0–17.0)
MCH: 27.5 pg (ref 26.0–34.0)
MCHC: 31.3 g/dL (ref 30.0–36.0)
MCV: 88 fL (ref 80.0–100.0)
Platelets: 191 10*3/uL (ref 150–400)
RBC: 3.82 MIL/uL — ABNORMAL LOW (ref 4.22–5.81)
RDW: 17 % — ABNORMAL HIGH (ref 11.5–15.5)
WBC: 5.9 10*3/uL (ref 4.0–10.5)
nRBC: 0 % (ref 0.0–0.2)

## 2019-09-21 LAB — HEMOGLOBIN A1C
Hgb A1c MFr Bld: 5.3 % (ref 4.8–5.6)
Mean Plasma Glucose: 105.41 mg/dL

## 2019-09-21 LAB — BASIC METABOLIC PANEL
Anion gap: 9 (ref 5–15)
BUN: 14 mg/dL (ref 8–23)
CO2: 26 mmol/L (ref 22–32)
Calcium: 8.4 mg/dL — ABNORMAL LOW (ref 8.9–10.3)
Chloride: 103 mmol/L (ref 98–111)
Creatinine, Ser: 1.07 mg/dL (ref 0.61–1.24)
GFR calc Af Amer: >60 mL/min (ref >60)
GFR calc non Af Amer: >60 mL/min (ref >60)
Glucose, Bld: 71 mg/dL (ref 70–99)
Potassium: 3.5 mmol/L (ref 3.5–5.1)
Sodium: 138 mmol/L (ref 135–145)

## 2019-09-21 MED ORDER — LIP MEDEX EX OINT
TOPICAL_OINTMENT | CUTANEOUS | Status: AC
  Filled 2019-09-21: qty 7

## 2019-09-21 NOTE — Progress Notes (Signed)
[facility]                                                                              Patient Demographics  Mark GaribaldiHenry Bullock, is a 86 y.o. male, DOB - 08-21-1928, UEA:[id number]  Admit date - 09/19/2019   Admitting Physician Eduard ClosArshad N Kakrakandy, MD  Outpatient Primary MD for the patient is [name], [name], MD  Outpatient specialists:   LOS - 2  days   Medical records reviewed and are as summarized below:    Chief Complaint  Patient presents with  . Necrotic Foot    Bilateral foot necrosis       Brief summary   Patient is a 86 year old male with history of PE, DVT, chronic systolic CHF, EF 35 to 40%, diabetes mellitus type 2, history of CVA with left-sided weakness, hypertension, BPH, B12 deficiency presented to ED after found to have increasing swelling of the both lower extremities extending up to the thigh and scrotum, increasing skin breakdown concerning for cellulitis.  Per report, patient has not been very cooperative with taking his medications.  He is from [facility]. Patient was admitted for acute on chronicCHF exacerbation, cellulitis of the lower extremities   Assessment & Plan    Principal Problem:   Cellulitis of both lower extremities -Patient appears to have some superimposed bacterial infection on chronic venous stasis and skin breakdown. -MRI of the left foot pending, ABIs within normal range -Continue IV antibiotics, diuresis, improving -Wound care consult pending, will benefit from Unna boots, will defer to wound care  Active Problems: Acute on chronic combined systolic and diastolic CHF exacerbation -Previous 2D echo 03/2019 had shown EF of 35 to 40%, impaired relaxation LV diastolic filling, noncompliance with Lasix -Continue Lasix 20 mg IV every 12 hours (outpatient on 20 mg daily) -Continue strict I's and O's and daily weights, negative balance of 1.1 L -Hold lisinopril, to allow diuresis  Mild acute  on CKD (chronic kidney disease), stage IIIa -Baseline 0.8, presented with creatinine of 1.2 -Creatinine currently stable, follow closely with diuresis     Type II diabetes mellitus with stage 3 chronic kidney disease (HCC) -CBGs currently controlled -Hemoglobin A1c 5.3, continue sliding scale insulin  Constipation -Received enema on 3/13, continue MiraLAX daily  BPH -Continue Flomax  History of DVT (deep venous thromboembolism), PE -Continue apixaban  Hyperlipidemia Continue statin  Anemia of chronic disease likely due to CKD -H&H currently at baseline  Dementia  -Currently no acute agitation or delirium, mental status improving from yesterday -Will benefit from outpatient palliative care to follow. -Initiate palliative medicine recommendations, need goals of care   Code Status: Full CODE STATUS DVT Prophylaxis: Apixaban Family Communication: Discussed all imaging results, lab results, explained to the patient    Disposition Plan: Patient from independent living facility.  Anticipated discharge to ALF, pending PT evaluation, currently on IV antibiotics, IV diuresis precludes safe discharge back to facility   Time Spent in minutes 25 minutes  Procedures:  None  Consultants:   Palliative medicine  Antimicrobials:   Anti-infectives (From admission, onward)   Start     Dose/Rate Route Frequency Ordered Stop  09/20/19 2200  vancomycin (VANCOREADY) IVPB 1250 mg/250 mL     1,250 mg 166.7 mL/hr over 90 Minutes Intravenous Every 24 hours 09/20/19 0404     09/20/19 0000  piperacillin-tazobactam (ZOSYN) IVPB 3.375 g     3.375 g 12.5 mL/hr over 240 Minutes Intravenous Every 8 hours 09/19/19 2234     09/19/19 2245  vancomycin (VANCOREADY) IVPB 1500 mg/300 mL     1,500 mg 150 mL/hr over 120 Minutes Intravenous  Once 09/19/19 2234 09/20/19 0137   09/19/19 1800  piperacillin-tazobactam (ZOSYN) IVPB 3.375 g     3.375 g 100 mL/hr over 30 Minutes Intravenous  Once 09/19/19  1753 09/19/19 2048         Medications  Scheduled Meds: . apixaban  5 mg Oral BID  . clopidogrel  75 mg Oral Daily  . feeding supplement (ENSURE ENLIVE)  237 mL Oral BID BM  . furosemide  20 mg Intravenous Q12H  . insulin aspart  0-9 Units Subcutaneous TID WC  . lip balm      . polyethylene glycol  17 g Oral Daily  . potassium chloride  10 mEq Oral Daily  . senna  1 tablet Oral BID  . simvastatin  20 mg Oral Daily  . tamsulosin  0.4 mg Oral QHS  . vitamin B-12  100 mcg Oral Daily   Continuous Infusions: . piperacillin-tazobactam (ZOSYN)  IV 3.375 g (09/21/19 0151)  . vancomycin 1,250 mg (09/20/19 2353)   PRN Meds:.acetaminophen **OR** acetaminophen, ondansetron **OR** ondansetron (ZOFRAN) IV, traMADol      Subjective:   Mark Bullock was seen and examined today. Has dementia but much more alert, pleasant and oriented today. Denies any pain. Lower extremity edema is improving. Patient denies dizziness, chest pain, abdominal pain. No acute shortness of breath.  Objective:   Vitals:   09/20/19 0601 09/20/19 1418 09/20/19 2038 09/21/19 0532  BP: (!) 92/57 (!) 101/56 (!) 99/58 110/70  Pulse: 69 73 70 73  Resp: Temp: 98.5 F (36.9 C) 98.3 F (36.8 C) 98.6 F (37 C) 98.2 F (36.8 C)  TempSrc: Oral Oral Oral Oral  SpO2: 99% 95% 96% 97%  Weight: 84.2 kg   85.1 kg    Intake/Output Summary (Last 24 hours) at 09/21/2019 1328 Last data filed at 09/21/2019 0936 Gross per 24 hour  Intake 974.09 ml  Output 801 ml  Net 173.09 ml     Wt Readings from Last 3 Encounters:  09/21/19 85.1 kg  04/01/19 87.5 kg  01/18/18 103.4 kg    Physical Exam  General: Alert and oriented x self and place, NAD  Cardiovascular: S1 S2 clear, RRR. 2+ pedal edema b/l  Respiratory: Decreased breath sound at the bases  Gastrointestinal: Soft, nontender, nondistended, NBS  Ext: 2+ pedal edema bilaterally  Neuro: no new deficits  Musculoskeletal: No cyanosis,  clubbing  Skin: Lower extremity edema with chronic venous stasis changes and skin breakdown, erythema, see pic below  Psych: Dementia but alert and oriented to self and place, pleasant        Data Reviewed:  I have personally reviewed following labs and imaging studies  Micro Results Recent Results (from the past 240 hour(s))  Culture, blood (routine x 2)     Status: None (Preliminary result)   Collection Time: 09/19/19  5:49 PM   Specimen: BLOOD  Result Value Ref Range Status   Specimen Description   Final    BLOOD LEFT ASSIST CONTROL Performed at [facility], [address], [place], [place] [address]    Special Requests   Final    BOTTLES DRAWN AEROBIC AND ANAEROBIC Blood Culture adequate volume Performed at [facility], [address] [place], [place] [address]    Culture   Final    NO GROWTH < 24 HOURS Performed at [facility], [address] [place], [place] [address]    Report Status PENDING  Incomplete  SARS CORONAVIRUS 2 (TAT 6-24 HRS) Nasopharyngeal Nasopharyngeal Swab     Status: None   Collection Time: 09/19/19  5:50 PM   Specimen: Nasopharyngeal Swab  Result Value Ref Range Status   SARS Coronavirus 2 NEGATIVE NEGATIVE Final    Comment: (NOTE) SARS-CoV-2 target nucleic acids are NOT DETECTED. The SARS-CoV-2 RNA is generally detectable in upper and lower respiratory specimens during the acute phase of infection. Negative results do not preclude SARS-CoV-2 infection, do not rule out co-infections with other pathogens, and should not be used as the sole basis for treatment or other patient management decisions. Negative results must be combined with clinical observations, patient history, and epidemiological information. The expected result is Negative. Fact Sheet for Patients: HairSlick.no Fact Sheet for Healthcare Providers: quierodirigir.com This test  is not yet approved or cleared by the [place] FDA and  has been authorized for detection and/or diagnosis of SARS-CoV-2 by FDA under an Emergency Use Authorization (EUA). This EUA will remain  in effect (meaning this test can be used) for the duration of the COVID-19 declaration under Section 56 4(b)(1) of the Act, 21 U.S.C. section 360bbb-3(b)(1), unless the authorization is terminated or revoked sooner. Performed at [facility], [address], [place], [place] [address]   Culture, blood (routine x 2)     Status: None (Preliminary result)   Collection Time: 09/19/19  5:54 PM   Specimen: BLOOD  Result Value Ref Range Status   Specimen Description   Final    BLOOD RIGHT ASSIST CONTROL Performed at [facility], [address], [place], [place] [address]    Special Requests   Final    BOTTLES DRAWN AEROBIC AND ANAEROBIC Blood Culture adequate volume Performed at [facility], [address], [place], [place] [address]    Culture   Final    NO GROWTH < 24 HOURS Performed at [facility], [address], [place], [place] [address]    Report Status PENDING  Incomplete    Radiology Reports DG Chest Port 1 View  Result Date: 09/19/2019 CLINICAL DATA:  Dyspnea. Leg swelling. EXAM: PORTABLE CHEST 1 VIEW COMPARISON:  Radiograph 03/28/2019. CT 03/30/2019 FINDINGS: Chronic elevation of right hemidiaphragm. Possible small right pleural effusion. Unchanged heart size and mediastinal contours. No pulmonary edema, focal airspace disease, or pneumothorax. Chronic change of both shoulders. No acute osseous abnormalities are seen. IMPRESSION: Chronic elevation of right hemidiaphragm with small right pleural effusion. Electronically Signed   By: Narda Rutherford M.D.   On: 09/19/2019 18:35   DG Foot 2 Views Left  Result Date: 09/19/2019 CLINICAL DATA:  Dyspnea. Patient has bilateral foot necrosis per technologist note. EXAM: LEFT FOOT - 2 VIEW  COMPARISON:  None. FINDINGS: AP views are limited by positioning, patient had difficulty tolerating the exam. Hammertoe deformity of the digits limits assessment. No obvious bony destruction or periosteal reaction. Small plantar calcaneal spur and Achilles tendon enthesophyte. Soft tissue edema is most prominent about the dorsum of the foot. No soft  tissue air or radiopaque foreign body. IMPRESSION: Soft tissue edema, most prominent about the dorsum of the foot. No soft tissue air. No radiographic evidence of osteomyelitis, however exam is limited by positioning. Electronically Signed   By: Narda Rutherford M.D.   On: 09/19/2019 18:37   VAS [place] ABI WITH/WO TBI  Result Date: 09/20/2019 LOWER EXTREMITY DOPPLER STUDY Indications: Rest pain. High Risk Factors: Hypertension, hyperlipidemia, Diabetes.  Comparison Study: no prior Performing Technologist: Blanch Media RVS  Examination Guidelines: A complete evaluation includes at minimum, Doppler waveform signals and systolic blood pressure reading at the level of bilateral brachial, anterior tibial, and posterior tibial arteries, when vessel segments are accessible. Bilateral testing is considered an integral part of a complete examination. Photoelectric Plethysmograph (PPG) waveforms and toe systolic pressure readings are included as required and additional duplex testing as needed. Limited examinations for reoccurring indications may be performed as noted.  ABI Findings: +---------+------------------+-----+---------+-------------------------+ Right    Rt Pressure (mmHg)IndexWaveform Comment                   +---------+------------------+-----+---------+-------------------------+ Brachial 95                     triphasic                          +---------+------------------+-----+---------+-------------------------+ PTA      118               1.05 triphasic                           +---------+------------------+-----+---------+-------------------------+ DP       102               0.91 triphasic                          +---------+------------------+-----+---------+-------------------------+ Great Toe                                unable to put toe cuff on +---------+------------------+-----+---------+-------------------------+ +---------+------------------+-----+---------+-------------------------+ Left     Lt Pressure (mmHg)IndexWaveform Comment                   +---------+------------------+-----+---------+-------------------------+ Brachial 112                    triphasic                          +---------+------------------+-----+---------+-------------------------+ PTA      111               0.99 triphasic                          +---------+------------------+-----+---------+-------------------------+ DP       94                0.84 triphasic                          +---------+------------------+-----+---------+-------------------------+ Great Toe                                unable to put toe cuff on +---------+------------------+-----+---------+-------------------------+ +-------+-----------+-----------+------------+------------+ ABI/TBIToday's ABIToday's TBIPrevious ABIPrevious TBI +-------+-----------+-----------+------------+------------+ Right  1.05                                           +-------+-----------+-----------+------------+------------+ Left   0.99                                           +-------+-----------+-----------+------------+------------+  Summary: Right: Resting right ankle-brachial index is within normal range. No evidence of significant right lower extremity arterial disease. Left: Resting left ankle-brachial index is within normal range. No evidence of significant left lower extremity arterial disease.  *See table(s) above for measurements and observations.  Electronically signed by Coral Else MD on 09/20/2019 at 7:05:05 PM.    Final    ECHOCARDIOGRAM COMPLETE  Result Date: 09/20/2019    ECHOCARDIOGRAM REPORT   Patient Name:   Mark Bullock Straw Date of Exam: 09/20/2019 Medical Rec #:  [id number]           Height:       68.0 in Accession #:    [id number]          Weight:       185.6 lb Date of Birth:  [birth year]           BSA:          1.980 m Patient Age:    86 years            BP:           92/57 mmHg Patient Gender: M                   HR:           71 bpm. Exam Location:  Inpatient Procedure: 2D Echo, Color Doppler and Cardiac Doppler Indications:    I50.9* Heart failure (unspecified)  History:        Patient has prior history of Echocardiogram examinations, most                 recent 03/30/2019. CHF; Risk Factors:Hypertension, Diabetes and                 Dyslipidemia.  Sonographer:    Irving Burton Senior RDCS Referring Phys: [id number] Rinnah Peppel K Liane Tribbey IMPRESSIONS  1. Left ventricular ejection fraction, by estimation, is 35 to 40%. The left ventricle has moderately decreased function. The left ventricle demonstrates global hypokinesis. Left ventricular diastolic parameters are consistent with Grade I diastolic dysfunction (impaired relaxation).  2. Right ventricular systolic function is moderately reduced. The right ventricular size is moderately enlarged. There is normal pulmonary artery systolic pressure. The estimated right ventricular systolic pressure is 29.3 mmHg.  3. The mitral valve is normal in structure. No evidence of mitral valve regurgitation. No evidence of mitral stenosis.  4. The aortic valve is abnormal. Aortic valve regurgitation is not visualized. No aortic stenosis is present.  5. The inferior vena cava is normal in size with <50% respiratory variability, suggesting right atrial pressure of 8 mmHg. Comparison(s): A prior study was performed on 03/30/2019. No significant change from prior study. Prior images reviewed side by side. FINDINGS  Left Ventricle: Left ventricular ejection  fraction, by estimation, is 35 to 40%. The left ventricle has moderately decreased function. The left ventricle demonstrates global hypokinesis. The left ventricular internal cavity size was normal in size.  There is no left ventricular hypertrophy. Left ventricular diastolic parameters are consistent with Grade I diastolic dysfunction (impaired relaxation). Right Ventricle: The right ventricular size is moderately enlarged. No increase in right ventricular wall thickness. Right ventricular systolic function is moderately reduced. There is normal pulmonary artery systolic pressure. The tricuspid regurgitant velocity is 2.31 m/s, and with an assumed right atrial pressure of 8 mmHg, the estimated right ventricular systolic pressure is 29.3 mmHg. Left Atrium: Left atrial size was normal in size. Right Atrium: Right atrial size was normal in size. Pericardium: There is no evidence of pericardial effusion. Mitral Valve: The mitral valve is normal in structure. Normal mobility of the mitral valve leaflets. No evidence of mitral valve regurgitation. No evidence of mitral valve stenosis. Tricuspid Valve: The tricuspid valve is normal in structure. Tricuspid valve regurgitation is trivial. No evidence of tricuspid stenosis. Aortic Valve: The aortic valve is abnormal. . There is mild thickening and mild calcification of the aortic valve. Aortic valve regurgitation is not visualized. No aortic stenosis is present. Mild aortic valve annular calcification. There is mild thickening of the aortic valve. There is mild calcification of the aortic valve. Pulmonic Valve: The pulmonic valve was normal in structure. Pulmonic valve regurgitation is trivial. No evidence of pulmonic stenosis. Aorta: The aortic root is normal in size and structure. Venous: The inferior vena cava is normal in size with less than 50% respiratory variability, suggesting right atrial pressure of 8 mmHg. IAS/Shunts: No atrial level shunt detected by color flow  Doppler.  LEFT VENTRICLE PLAX 2D LVIDd:         4.40 cm     Diastology LVIDs:         2.70 cm     LV e' medial: 8.27 cm/s LV PW:         1.00 cm LV IVS:        0.90 cm LVOT diam:     2.10 cm LV SV:         53 LV SV Index:   27 LVOT Area:     3.46 cm  LV Volumes (MOD) LV vol d, MOD A2C: 85.2 ml LV vol d, MOD A4C: 97.8 ml LV vol s, MOD A2C: 42.9 ml LV vol s, MOD A4C: 52.7 ml LV SV MOD A2C:     42.3 ml LV SV MOD A4C:     97.8 ml LV SV MOD BP:      45.4 ml RIGHT VENTRICLE RV S prime:     13.20 cm/s TAPSE (M-mode): 1.8 cm LEFT ATRIUM             Index       RIGHT ATRIUM           Index LA diam:        2.90 cm 1.46 cm/m  RA Area:     19.30 cm LA Vol (A2C):   56.0 ml 28.28 ml/m RA Volume:   56.10 ml  28.33 ml/m LA Vol (A4C):   25.4 ml 12.83 ml/m LA Biplane Vol: 42.1 ml 21.26 ml/m  AORTIC VALVE LVOT Vmax:   88.10 cm/s LVOT Vmean:  62.700 cm/s LVOT VTI:    0.152 m  AORTA Ao Root diam: 3.40 cm TRICUSPID VALVE TR Peak grad:   21.3 mmHg TR Vmax:        231.00 cm/s  SHUNTS Systemic VTI:  0.15 m Systemic Diam: 2.10 cm Weston Brass MD Electronically signed by Weston Brass MD Signature Date/Time: 09/20/2019/9:46:43 PM  Final     Lab Data:  CBC: Recent Labs  Lab 09/19/19 1749 09/20/19 0432 09/21/19 0557  WBC 8.4 6.9 5.9  NEUTROABS 7.0  --   --   HGB 11.5* 10.4* 10.5*  HCT 36.7* 33.0* 33.6*  MCV 90.0 89.4 88.0  PLT 220 194 785   Basic Metabolic Panel: Recent Labs  Lab 09/19/19 1749 09/20/19 0432 09/21/19 0557  NA 139 137 138  K 3.8 3.8 3.5  CL 101 104 103  CO2 29 26 26   GLUCOSE 129* 136* 71  BUN 17 17 14   CREATININE 1.21 1.01 1.07  CALCIUM 9.0 8.4* 8.4*   GFR: Estimated Creatinine Clearance: 50.7 mL/min (by C-G formula based on SCr of 1.07 mg/dL). Liver Function Tests: Recent Labs  Lab 09/19/19 1749  AST 14*  ALT 28  ALKPHOS 52  BILITOT 2.3*  PROT 6.6  ALBUMIN 3.2*   No results for input(s): LIPASE, AMYLASE in the last 168 hours. No results for input(s): AMMONIA in the last  168 hours. Coagulation Profile: No results for input(s): INR, PROTIME in the last 168 hours. Cardiac Enzymes: No results for input(s): CKTOTAL, CKMB, CKMBINDEX, TROPONINI in the last 168 hours. BNP (last 3 results) No results for input(s): PROBNP in the last 8760 hours. HbA1C: Recent Labs    09/21/19 0557  HGBA1C 5.3   CBG: Recent Labs  Lab 09/20/19 1129 09/20/19 1602 09/20/19 2115 09/21/19 0756 09/21/19 1132  GLUCAP 117* 95 175* 77 89   Lipid Profile: No results for input(s): CHOL, HDL, LDLCALC, TRIG, CHOLHDL, LDLDIRECT in the last 72 hours. Thyroid Function Tests: No results for input(s): TSH, T4TOTAL, FREET4, T3FREE, THYROIDAB in the last 72 hours. Anemia Panel: No results for input(s): VITAMINB12, FOLATE, FERRITIN, TIBC, IRON, RETICCTPCT in the last 72 hours. Urine analysis:    Component Value Date/Time   COLORURINE YELLOW 09/19/2019 1749   APPEARANCEUR HAZY (A) 09/19/2019 1749   LABSPEC 1.011 09/19/2019 1749   PHURINE 6.0 09/19/2019 1749   GLUCOSEU NEGATIVE 09/19/2019 1749   HGBUR LARGE (A) 09/19/2019 1749   BILIRUBINUR NEGATIVE 09/19/2019 1749   KETONESUR NEGATIVE 09/19/2019 1749   PROTEINUR NEGATIVE 09/19/2019 1749   UROBILINOGEN 1.0 01/16/2010 0200   NITRITE NEGATIVE 09/19/2019 1749   LEUKOCYTESUR NEGATIVE 09/19/2019 1749     Bailley Guilford M.D. [facility] 09/21/2019, 1:28 PM   Call night coverage person covering after 7pm
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
## 2019-09-21 NOTE — Progress Notes (Signed)
Patient refused all PO medications tonight after RN asked if he preferred meds to be crushed (pill crusher at bedside).  Patient became very irritated and stated that he doesn't like meds crushed and he doesn't want to be forced to take anything he doesn't want.  Patient education provided about scheduled medications.  RN offered to give meds without crushing meds and providing patient with ice water instead.  Patient refused to respond to questions about taking meds without being crushed. RN offered to bring back scheduled meds at a better time, patient refused to respond.  RN offered scheduled medications x2. RN will continue to monitor patient. Patient call bell within reach with bed in lowest position and bed alarm in use.

## 2019-09-22 ENCOUNTER — Inpatient Hospital Stay (HOSPITAL_COMMUNITY): Payer: Medicare Other

## 2019-09-22 DIAGNOSIS — Z7189 Other specified counseling: Secondary | ICD-10-CM

## 2019-09-22 LAB — BASIC METABOLIC PANEL
Anion gap: 8 (ref 5–15)
BUN: 16 mg/dL (ref 8–23)
CO2: 27 mmol/L (ref 22–32)
Calcium: 8.4 mg/dL — ABNORMAL LOW (ref 8.9–10.3)
Chloride: 100 mmol/L (ref 98–111)
Creatinine, Ser: 0.98 mg/dL (ref 0.61–1.24)
GFR calc Af Amer: >60 mL/min (ref >60)
GFR calc non Af Amer: >60 mL/min (ref >60)
Glucose, Bld: 94 mg/dL (ref 70–99)
Potassium: 3.7 mmol/L (ref 3.5–5.1)
Sodium: 135 mmol/L (ref 135–145)

## 2019-09-22 LAB — GLUCOSE, CAPILLARY
Glucose-Capillary: 88 mg/dL (ref 70–99)
Glucose-Capillary: 106 mg/dL — ABNORMAL HIGH (ref 70–99)
Glucose-Capillary: 133 mg/dL — ABNORMAL HIGH (ref 70–99)
Glucose-Capillary: 110 mg/dL — ABNORMAL HIGH (ref 70–99)

## 2019-09-22 LAB — CBC
HCT: 35 % — ABNORMAL LOW (ref 39.0–52.0)
Hemoglobin: 11 g/dL — ABNORMAL LOW (ref 13.0–17.0)
MCH: 27.7 pg (ref 26.0–34.0)
MCHC: 31.4 g/dL (ref 30.0–36.0)
MCV: 88.2 fL (ref 80.0–100.0)
Platelets: 206 10*3/uL (ref 150–400)
RBC: 3.97 MIL/uL — ABNORMAL LOW (ref 4.22–5.81)
RDW: 16.9 % — ABNORMAL HIGH (ref 11.5–15.5)
WBC: 7.5 10*3/uL (ref 4.0–10.5)
nRBC: 0 % (ref 0.0–0.2)

## 2019-09-22 MED ORDER — CEFAZOLIN SODIUM-DEXTROSE 2-4 GM/100ML-% IV SOLN: 2.0000 g | Freq: Three times a day (TID) | INTRAVENOUS | Status: DC

## 2019-09-22 MED ORDER — LORAZEPAM 2 MG/ML IJ SOLN
1.0000 mg | Freq: Once | INTRAMUSCULAR | Status: AC
  Administered 2019-09-22: 13:00:00 1 mg via INTRAVENOUS
  Filled 2019-09-22: qty 1

## 2019-09-22 MED ORDER — CEFAZOLIN SODIUM-DEXTROSE 2-4 GM/100ML-% IV SOLN
2.0000 g | Freq: Three times a day (TID) | INTRAVENOUS | Status: DC
  Administered 2019-09-22 – 2019-09-25 (×8): 2 g via INTRAVENOUS
  Filled 2019-09-22 (×10): qty 100

## 2019-09-22 NOTE — Progress Notes (Signed)
Daily Progress Note   Patient Name: Mark Bullock       Date: 09/22/2019 DOB: [birth year]  Age: 86 y.o. MRN#: [id number] Attending Physician: Mendel [facility], MD Primary Care Physician: Lorene Dy, MD Admit Date: 09/19/2019  Reason for Consultation/Follow-up: Establishing goals of care and Psychosocial/spiritual support  Subjective: Had 50 minutes of face to face interaction with Mark Bullock.  Afterward I spoke with his daughter Jeanett Schlein on the phone for 15 minutes.  While I enjoy Mark Bullock he is difficult to speak with at length as he frequently changes topics and is often untrusting of what I am saying.  I attempted to discuss his health with him.  I stated he has diabetes, a degree of heart failure and kidney failure.  He was surprised and wanted to know how I knew these things - what proof did I have?  I encouraged him to appoint one of his children to help make medical decisions for him in the event that he can not make them for himself.  He mentioned that the children as a group have designated [place]. He began to talk to me about a financial transaction with a Mr. Lolly Mustache of [employer] that he was very unhappy about.  He said he did not read the paper he was asked to sign and now he is in trouble.  - For this reason I encouraged him to designate one of his children to be his durable POA.  He told me he already had that done and it was [place].  I explained that he needed to have a signed and notarized legal document naming a particular person to be his POA - then Jeanett Schlein could help him.  I attempted to talk him about code status.  Mark Bullock indicated that if his heart stopped he would want to be resuscitated.  "I want to live to be as old as [place]".  I  supported that decision and asked - if we are successful in resuscitating you then you will go to the ICU on life support.  If you do well then you will graduate off.  If you do not do well then we will have to talk with your children about what to do.  Mark Bullock stated that his children already know what to do.    (Of  note he stated that the orange juice in front of him was "as old as [place]".  Thus he is using the phrase to mean old rather than using it in a literal sense.)  We talked about many things including - how to use his cell phone, [employer], his buying a birthday present for a movie star - are just a few examples.  After our conversation I spoke with Para March and relayed the highlights of our conversation.  Para March understands the importance of having HCPOA and DPOA in place.  She is attempted to do those things but states her father is of a very independent mind.    I expressed that I believe her father likely has dementia and that this will continue to worsen.  I question if Mark Bullock has capacity to make his medical and financial decisions.  I encouraged her to seek neurological or psychiatric assistance outpatient to further support her father.    Assessment: Patient's cellulitis is medically improving.  I fear he has dementia and his ability to make decisions is deteriorating.   Patient Profile/HPI:  86 y.o. male  with past medical history of CHF (EF 33 - 40%), CKD 3A, DM, CVA, DVT/PE who was admitted on 09/19/2019 with cellulitis of the LLE with bilateral lower extremity swelling and scrotal swelling.  Of note the patient has had multiple admissions for similar symptoms.  While in the hospital he has at times refused meds and refused care.    Length of Stay: 3  Current Medications: Scheduled Meds:  . apixaban  5 mg Oral BID  . clopidogrel  75 mg Oral Daily  . feeding supplement (ENSURE ENLIVE)  237 mL Oral BID BM  . furosemide  20 mg Intravenous Q12H  .  insulin aspart  0-9 Units Subcutaneous TID WC  . LORazepam  1 mg Intravenous Once  . polyethylene glycol  17 g Oral Daily  . potassium chloride  10 mEq Oral Daily  . senna  1 tablet Oral BID  . simvastatin  20 mg Oral Daily  . tamsulosin  0.4 mg Oral QHS  . vitamin B-12  100 mcg Oral Daily    Continuous Infusions: .  ceFAZolin (ANCEF) IV      PRN Meds: acetaminophen **OR** acetaminophen, ondansetron **OR** ondansetron (ZOFRAN) IV, traMADol  Physical Exam      Elderly, well developed male, awake, alert, interactive, NAD  Vital Signs: BP (!) 96/56 (BP Location: Left Arm)   Pulse 69   Temp 98.6 F (37 C) (Oral)   Resp 19   Wt 84.2 kg   SpO2 95%   BMI 28.22 kg/m  SpO2: SpO2: 95 % O2 Device: O2 Device: Room Air O2 Flow Rate:    Intake/output summary:   Intake/Output Summary (Last 24 hours) at 09/22/2019 1223 Last data filed at 09/22/2019 0540 Gross per 24 hour  Intake --  Output 1600 ml  Net -1600 ml   LBM: Last BM Date: 09/21/19 Baseline Weight: Weight: 84.1 kg Most recent weight: Weight: 84.2 kg       Palliative Assessment/Data: 40%      Patient Active Problem List   Diagnosis Date Noted  . Palliative care encounter   . Cellulitis of both lower extremities 09/19/2019  . Cellulitis 09/19/2019  . Chronic systolic CHF (congestive heart failure) (HCC) 03/30/2019  . Leg DVT (deep venous thromboembolism), acute, left (H[facility]) 03/30/2019  . Acute pulmonary embolism without acute cor pulmonale (HCC) 03/30/2019  . Cellulitis of leg, left 03/29/2019  .  Chronic cerebrovascular accident (CVA) 07/07/2017  . Essential hypertension, benign 05/24/2017  . Type II diabetes mellitus with stage 3 chronic kidney disease (HCC) 05/24/2017  . Dyslipidemia associated with type 2 diabetes mellitus (HCC) 05/24/2017  . Vascular dementia without behavioral disturbance ([facility]CC) 05/24/2017  . Vitamin B 12 deficiency 05/24/2017  . Rhabdomyolysis 05/19/2017  . AKI (acute kidney injury) ([facility])  05/18/2017  . Elevated troponin 05/18/2017  . History of completed stroke 05/18/2017  . CKD (chronic kidney disease), stage III 05/18/2017  . Physical deconditioning 05/08/2017  . Osteoarthritis 05/08/2017  . Bilateral lower extremity edema   . Abnormal EKG   . Elevated brain natriuretic peptide (BNP) level     Palliative Care Plan    Recommendations/Plan: Palliative Care to follow outpatient at [facility].  Discussed with patient and daughter who are agreeable. Full code, full scope Family advised to obtain durable POA, medical POA Patient would likely benefit from outpatient neurology and psychiatric follow up as I believe he has progressive dementia  Goals of Care and Additional Recommendations: Limitations on Scope of Treatment: Full Scope Treatment  Code Status:  Full code  Prognosis:  Unable to determine   Discharge Planning: [facility] with Palliative to follow.  Care plan was discussed with patient and family.  Thank you for allowing the Palliative Medicine Team to assist in the care of this patient.  Total time spent:  65 min. Time in 10:00 Time out 11:15     Greater than 50%  of this time was spent counseling and coordinating care related to the above assessment and plan.  Norvel Richards, PA-C Palliative Medicine  Please contact Palliative MedicineTeam phone at [phone] for questions and concerns between 7 am - 7 pm.   Please see [facility] for individual provider pager numbers.

## 2019-09-22 NOTE — Progress Notes (Signed)
[facility]                                                                              Patient Demographics  Mark Bullock, is a 86 y.o. male, DOB - February 19, 1929, ZOX:[id number]  Admit date - 09/19/2019   Admitting Physician Eduard Clos, MD  Outpatient Primary MD for the patient is Burton Apley, MD  Outpatient specialists:   LOS - 3  days   Medical records reviewed and are as summarized below:    Chief Complaint  Patient presents with  . Necrotic Foot    Bilateral foot necrosis       Brief summary   Patient is a 86 year old male with history of PE, DVT, chronic systolic CHF, EF 35 to 40%, diabetes mellitus type 2, history of CVA with left-sided weakness, hypertension, BPH, B12 deficiency presented to ED after found to have increasing swelling of the both lower extremities extending up to the thigh and scrotum, increasing skin breakdown concerning for cellulitis.  Per report, patient has not been very cooperative with taking his medications.  He is from [facility] independent [facility] facility. Patient was admitted for acute on chronicCHF exacerbation, cellulitis of the lower extremities   Assessment & Plan    Principal Problem:   Cellulitis of both lower extremities -Patient appears to have some superimposed bacterial infection on chronic venous stasis and skin breakdown. -ABIs in the normal range, MRI of the left foot pending today -Cellulitis improving, narrow IV antibiotics, vancomycin and Zosyn to IV cefazolin -Continue IV Lasix, edema improving  -Appreciate wound care recommendations  Active Problems: Acute on chronic combined systolic and diastolic CHF exacerbation -Previous 2D echo 03/2019 had shown EF of 35 to 40%, impaired relaxation LV diastolic filling, noncompliance with Lasix -Continue IV Lasix 20 mg every 12 hours, (outpatient on 20 mg daily) -Negative balance of 2.4 L.,  Holding lisinopril to allow diuresis -Creatinine  currently stable, transition to oral Lasix in next 24 to 48 hours  Mild acute on CKD (chronic kidney disease), stage IIIa -Baseline 0.8, presented with creatinine of 1.2 -Creatinine improving with diuresis,     Type II diabetes mellitus with stage 3 chronic kidney disease (HCC) -CBGs currently controlled -Hemoglobin A1c 5.3, continue sliding scale insulin  Constipation -Received enema on 3/13, continue MiraLAX daily  BPH -Continue Flomax  History of DVT (deep venous thromboembolism), PE -Continue apixaban  Hyperlipidemia Continue statin  Anemia of chronic disease likely due to CKDIII -H&H currently at baseline  Dementia  -Currently no acute agitation or delirium, mental status improving from yesterday -Will benefit from outpatient palliative care to follow. -Appreciate palliative medicine goals of care meeting and recommendations.  Code Status: Full CODE STATUS DVT Prophylaxis: Apixaban Family Communication: Discussed all imaging results, lab results, explained to the patient    Disposition Plan: Patient from independent living facility.  Anticipated discharge to ALF, pending PT, OT evaluation.  Currently on IV diuresis and IV antibiotics, likely DC in 24 to 48 hours   Time Spent in minutes 25 minutes  Procedures:  None  Consultants:   Palliative medicine  Antimicrobials:   Anti-infectives (From admission, onward)  Start     Dose/Rate Route Frequency Ordered Stop   09/22/19 2000  ceFAZolin (ANCEF) IVPB 2g/100 mL premix  Status:  Discontinued     2 g 200 mL/hr over 30 Minutes Intravenous Every 8 hours 09/22/19 1150 09/22/19 1153   09/22/19 2000  ceFAZolin (ANCEF) IVPB 2g/100 mL premix     2 g 200 mL/hr over 30 Minutes Intravenous Every 8 hours 09/22/19 1153     09/20/19 2200  vancomycin (VANCOREADY) IVPB 1250 mg/250 mL  Status:  Discontinued     1,250 mg 166.7 mL/hr over 90 Minutes Intravenous Every 24 hours 09/20/19 0404 09/22/19 1143   09/20/19 0000   piperacillin-tazobactam (ZOSYN) IVPB 3.375 g  Status:  Discontinued     3.375 g 12.5 mL/hr over 240 Minutes Intravenous Every 8 hours 09/19/19 2234 09/22/19 1142   09/19/19 2245  vancomycin (VANCOREADY) IVPB 1500 mg/300 mL     1,500 mg 150 mL/hr over 120 Minutes Intravenous  Once 09/19/19 2234 09/20/19 0137   09/19/19 1800  piperacillin-tazobactam (ZOSYN) IVPB 3.375 g     3.375 g 100 mL/hr over 30 Minutes Intravenous  Once 09/19/19 1753 09/19/19 2048         Medications  Scheduled Meds: . apixaban  5 mg Oral BID  . clopidogrel  75 mg Oral Daily  . feeding supplement (ENSURE ENLIVE)  237 mL Oral BID BM  . furosemide  20 mg Intravenous Q12H  . insulin aspart  0-9 Units Subcutaneous TID WC  . polyethylene glycol  17 g Oral Daily  . potassium chloride  10 mEq Oral Daily  . senna  1 tablet Oral BID  . simvastatin  20 mg Oral Daily  . tamsulosin  0.4 mg Oral QHS  . vitamin B-12  100 mcg Oral Daily   Continuous Infusions: .  ceFAZolin (ANCEF) IV     PRN Meds:.acetaminophen **OR** acetaminophen, ondansetron **OR** ondansetron (ZOFRAN) IV, traMADol      Subjective:   Mark Bullock was seen and examined today.  Much more alert and pleasant today.  Overnight no acute issues.  Lower extremity swelling and redness is improving.  No chest pain or shortness of breath.  No dizziness, abdominal pain, nausea vomiting or diarrhea.  No fevers   Objective:   Vitals:   09/21/19 1405 09/21/19 2042 09/22/19 0537 09/22/19 1358  BP: 107/72 91/64 (!) 96/56 136/85  Pulse: 68 79 69 75  Resp: 20 20 19 18   Temp: 98.3 F (36.8 C) 98.8 F (37.1 C) 98.6 F (37 C) 98.3 F (36.8 C)  TempSrc: Oral Oral Oral Oral  SpO2: 100% 94% 95% 98%  Weight:   84.2 kg     Intake/Output Summary (Last 24 hours) at 09/22/2019 1414 Last data filed at 09/22/2019 1000 Gross per 24 hour  Intake 240 ml  Output 1600 ml  Net -1360 ml     Wt Readings from Last 3 Encounters:  09/22/19 84.2 kg  04/01/19  87.5 kg  01/18/18 103.4 kg   Physical Exam  General: Alert and oriented x 3, somewhat tangential  Cardiovascular: S1 S2 clear, RRR.  1-2+ pedal edema b/l  Respiratory: CTAB, no wheezing, rales or rhonchi  Gastrointestinal: Soft, nontender, nondistended, NBS  Ext: 1-2+ pedal edema bilaterally, improving  Neuro: no new deficits  Musculoskeletal: No cyanosis, clubbing  Skin: Lower extremity edema with chronic venous stasis changes, skin breakdown  Psych: Has underlying dementia, somewhat tangential, pleasant         Data Reviewed:  I  have personally reviewed following labs and imaging studies  Micro Results Recent Results (from the past 240 hour(s))  Culture, blood (routine x 2)     Status: None (Preliminary result)   Collection Time: 09/19/19  5:49 PM   Specimen: BLOOD  Result Value Ref Range Status   Specimen Description BLOOD LEFT ASSIST CONTROL  Final   Special Requests   Final    BOTTLES DRAWN AEROBIC AND ANAEROBIC Blood Culture adequate volume Performed at [facility] [address], [place], [place] [address]    Culture NO GROWTH 3 DAYS  Final   Report Status PENDING  Incomplete  SARS CORONAVIRUS 2 (TAT 6-24 HRS) Nasopharyngeal Nasopharyngeal Swab     Status: None   Collection Time: 09/19/19  5:50 PM   Specimen: Nasopharyngeal Swab  Result Value Ref Range Status   SARS Coronavirus 2 NEGATIVE NEGATIVE Final    Comment: (NOTE) SARS-CoV-2 target nucleic acids are NOT DETECTED. The SARS-CoV-2 RNA is generally detectable in upper and lower respiratory specimens during the acute phase of infection. Negative results do not preclude SARS-CoV-2 infection, do not rule out co-infections with other pathogens, and should not be used as the sole basis for treatment or other patient management decisions. Negative results must be combined with clinical observations, patient history, and epidemiological information. The expected result is  Negative. Fact Sheet for Patients: SugarRoll.be Fact Sheet for Healthcare Providers: https://www.woods-mathews.com/ This test is not yet approved or cleared by the [place] FDA and  has been authorized for detection and/or diagnosis of SARS-CoV-2 by FDA under an Emergency Use Authorization (EUA). This EUA will remain  in effect (meaning this test can be used) for the duration of the COVID-19 declaration under Section 56 4(b)(1) of the Act, 21 U.S.C. section 360bbb-3(b)(1), unless the authorization is terminated or revoked sooner. Performed at [facility], [place] [address], [place], [place] [address]   Culture, blood (routine x 2)     Status: None (Preliminary result)   Collection Time: 09/19/19  5:54 PM   Specimen: BLOOD  Result Value Ref Range Status   Specimen Description BLOOD RIGHT ASSIST CONTROL  Final   Special Requests   Final    BOTTLES DRAWN AEROBIC AND ANAEROBIC Blood Culture adequate volume Performed at [facility] [address], [place],  [address]    Culture NO GROWTH 3 DAYS  Final   Report Status PENDING  Incomplete    Radiology Reports DG Chest Port 1 View  Result Date: 09/19/2019 CLINICAL DATA:  Dyspnea. Leg swelling. EXAM: PORTABLE CHEST 1 VIEW COMPARISON:  Radiograph 03/28/2019. CT 03/30/2019 FINDINGS: Chronic elevation of right hemidiaphragm. Possible small right pleural effusion. Unchanged heart size and mediastinal contours. No pulmonary edema, focal airspace disease, or pneumothorax. Chronic change of both shoulders. No acute osseous abnormalities are seen. IMPRESSION: Chronic elevation of right hemidiaphragm with small right pleural effusion. Electronically Signed   By: Keith Rake M.D.   On: 09/19/2019 18:35   DG Foot 2 Views Left  Result Date: 09/19/2019 CLINICAL DATA:  Dyspnea. Patient has bilateral foot necrosis per technologist note. EXAM: LEFT FOOT - 2 VIEW COMPARISON:   None. FINDINGS: AP views are limited by positioning, patient had difficulty tolerating the exam. Hammertoe deformity of the digits limits assessment. No obvious bony destruction or periosteal reaction. Small plantar calcaneal spur and Achilles tendon enthesophyte. Soft tissue edema is most prominent about the dorsum of the foot. No soft tissue air or radiopaque foreign body. IMPRESSION:  Soft tissue edema, most prominent about the dorsum of the foot. No soft tissue air. No radiographic evidence of osteomyelitis, however exam is limited by positioning. Electronically Signed   By: Narda Rutherford M.D.   On: 09/19/2019 18:37   VAS [place] ABI WITH/WO TBI  Result Date: 09/20/2019 LOWER EXTREMITY DOPPLER STUDY Indications: Rest pain. High Risk Factors: Hypertension, hyperlipidemia, Diabetes.  Comparison Study: no prior Performing Technologist: Blanch Media RVS  Examination Guidelines: A complete evaluation includes at minimum, Doppler waveform signals and systolic blood pressure reading at the level of bilateral brachial, anterior tibial, and posterior tibial arteries, when vessel segments are accessible. Bilateral testing is considered an integral part of a complete examination. Photoelectric Plethysmograph (PPG) waveforms and toe systolic pressure readings are included as required and additional duplex testing as needed. Limited examinations for reoccurring indications may be performed as noted.  ABI Findings: +---------+------------------+-----+---------+-------------------------+ Right    Rt Pressure (mmHg)IndexWaveform Comment                   +---------+------------------+-----+---------+-------------------------+ Brachial 95                     triphasic                          +---------+------------------+-----+---------+-------------------------+ PTA      118               1.05 triphasic                          +---------+------------------+-----+---------+-------------------------+ DP        102               0.91 triphasic                          +---------+------------------+-----+---------+-------------------------+ Great Toe                                unable to put toe cuff on +---------+------------------+-----+---------+-------------------------+ +---------+------------------+-----+---------+-------------------------+ Left     Lt Pressure (mmHg)IndexWaveform Comment                   +---------+------------------+-----+---------+-------------------------+ Brachial 112                    triphasic                          +---------+------------------+-----+---------+-------------------------+ PTA      111               0.99 triphasic                          +---------+------------------+-----+---------+-------------------------+ DP       94                0.84 triphasic                          +---------+------------------+-----+---------+-------------------------+ Great Toe                                unable to put toe cuff on +---------+------------------+-----+---------+-------------------------+ +-------+-----------+-----------+------------+------------+ ABI/TBIToday's ABIToday's TBIPrevious ABIPrevious TBI +-------+-----------+-----------+------------+------------+ Right  1.05                                           +-------+-----------+-----------+------------+------------+  Left   0.99                                           +-------+-----------+-----------+------------+------------+  Summary: Right: Resting right ankle-brachial index is within normal range. No evidence of significant right lower extremity arterial disease. Left: Resting left ankle-brachial index is within normal range. No evidence of significant left lower extremity arterial disease.  *See table(s) above for measurements and observations.  Electronically signed by Coral ElseVance Brabham MD on 09/20/2019 at 7:05:05 PM.    Final    ECHOCARDIOGRAM  COMPLETE  Result Date: 09/20/2019    ECHOCARDIOGRAM REPORT   Patient Name:   Mark Bullock Date of Exam: 09/20/2019 Medical Rec #:  [id number]           Height:       68.0 in Accession #:    [id number]          Weight:       185.6 lb Date of Birth:  05/04/1929           BSA:          1.980 m Patient Age:    89 years            BP:           92/57 mmHg Patient Gender: M                   HR:           71 bpm. Exam Location:  Inpatient Procedure: 2D Echo, Color Doppler and Cardiac Doppler Indications:    I50.9* Heart failure (unspecified)  History:        Patient has prior history of Echocardiogram examinations, most                 recent 03/30/2019. CHF; Risk Factors:Hypertension, Diabetes and                 Dyslipidemia.  Sonographer:    Irving BurtonEmily Senior RDCS Referring Phys: [id number] Antanasia Kaczynski K Kapena Hamme IMPRESSIONS  1. Left ventricular ejection fraction, by estimation, is 35 to 40%. The left ventricle has moderately decreased function. The left ventricle demonstrates global hypokinesis. Left ventricular diastolic parameters are consistent with Grade I diastolic dysfunction (impaired relaxation).  2. Right ventricular systolic function is moderately reduced. The right ventricular size is moderately enlarged. There is normal pulmonary artery systolic pressure. The estimated right ventricular systolic pressure is 29.3 mmHg.  3. The mitral valve is normal in structure. No evidence of mitral valve regurgitation. No evidence of mitral stenosis.  4. The aortic valve is abnormal. Aortic valve regurgitation is not visualized. No aortic stenosis is present.  5. The inferior vena cava is normal in size with <50% respiratory variability, suggesting right atrial pressure of 8 mmHg. Comparison(s): A prior study was performed on 03/30/2019. No significant change from prior study. Prior images reviewed side by side. FINDINGS  Left Ventricle: Left ventricular ejection fraction, by estimation, is 35 to 40%. The left ventricle has moderately  decreased function. The left ventricle demonstrates global hypokinesis. The left ventricular internal cavity size was normal in size. There is no left ventricular hypertrophy. Left ventricular diastolic parameters are consistent with Grade I diastolic dysfunction (impaired relaxation). Right Ventricle: The right ventricular size is moderately enlarged. No increase in right ventricular wall thickness. Right ventricular systolic function is moderately reduced. There is  normal pulmonary artery systolic pressure. The tricuspid regurgitant velocity is 2.31 m/s, and with an assumed right atrial pressure of 8 mmHg, the estimated right ventricular systolic pressure is 29.3 mmHg. Left Atrium: Left atrial size was normal in size. Right Atrium: Right atrial size was normal in size. Pericardium: There is no evidence of pericardial effusion. Mitral Valve: The mitral valve is normal in structure. Normal mobility of the mitral valve leaflets. No evidence of mitral valve regurgitation. No evidence of mitral valve stenosis. Tricuspid Valve: The tricuspid valve is normal in structure. Tricuspid valve regurgitation is trivial. No evidence of tricuspid stenosis. Aortic Valve: The aortic valve is abnormal. . There is mild thickening and mild calcification of the aortic valve. Aortic valve regurgitation is not visualized. No aortic stenosis is present. Mild aortic valve annular calcification. There is mild thickening of the aortic valve. There is mild calcification of the aortic valve. Pulmonic Valve: The pulmonic valve was normal in structure. Pulmonic valve regurgitation is trivial. No evidence of pulmonic stenosis. Aorta: The aortic root is normal in size and structure. Venous: The inferior vena cava is normal in size with less than 50% respiratory variability, suggesting right atrial pressure of 8 mmHg. IAS/Shunts: No atrial level shunt detected by color flow Doppler.  LEFT VENTRICLE PLAX 2D LVIDd:         4.40 cm     Diastology  LVIDs:         2.70 cm     LV e' medial: 8.27 cm/s LV PW:         1.00 cm LV IVS:        0.90 cm LVOT diam:     2.10 cm LV SV:         53 LV SV Index:   27 LVOT Area:     3.46 cm  LV Volumes (MOD) LV vol d, MOD A2C: 85.2 ml LV vol d, MOD A4C: 97.8 ml LV vol s, MOD A2C: 42.9 ml LV vol s, MOD A4C: 52.7 ml LV SV MOD A2C:     42.3 ml LV SV MOD A4C:     97.8 ml LV SV MOD BP:      45.4 ml RIGHT VENTRICLE RV S prime:     13.20 cm/s TAPSE (M-mode): 1.8 cm LEFT ATRIUM             Index       RIGHT ATRIUM           Index LA diam:        2.90 cm 1.46 cm/m  RA Area:     19.30 cm LA Vol (A2C):   56.0 ml 28.28 ml/m RA Volume:   56.10 ml  28.33 ml/m LA Vol (A4C):   25.4 ml 12.83 ml/m LA Biplane Vol: 42.1 ml 21.26 ml/m  AORTIC VALVE LVOT Vmax:   88.10 cm/s LVOT Vmean:  62.700 cm/s LVOT VTI:    0.152 m  AORTA Ao Root diam: 3.40 cm TRICUSPID VALVE TR Peak grad:   21.3 mmHg TR Vmax:        231.00 cm/s  SHUNTS Systemic VTI:  0.15 m Systemic Diam: 2.10 cm Weston Brass MD Electronically signed by Weston Brass MD Signature Date/Time: 09/20/2019/9:46:43 PM    Final     Lab Data:  CBC: Recent Labs  Lab 09/19/19 1749 09/20/19 0432 09/21/19 0557 09/22/19 0536  WBC 8.4 6.9 5.9 7.5  NEUTROABS 7.0  --   --   --   HGB 11.5* 10.4*  10.5* 11.0*  HCT 36.7* 33.0* 33.6* 35.0*  MCV 90.0 89.4 88.0 88.2  PLT 220 194 191 206   Basic Metabolic Panel: Recent Labs  Lab 09/19/19 1749 09/20/19 0432 09/21/19 0557 09/22/19 0536  NA 139 137 138 135  K 3.8 3.8 3.5 3.7  CL 101 104 103 100  CO2 GLUCOSE 129* 136* 71 94  BUN CREATININE 1.21 1.01 1.07 0.98  CALCIUM 9.0 8.4* 8.4* 8.4*   GFR: Estimated Creatinine Clearance: 55.1 mL/min (by C-G formula based on SCr of 0.98 mg/dL). Liver Function Tests: Recent Labs  Lab 09/19/19 1749  AST 14*  ALT 28  ALKPHOS 52  BILITOT 2.3*  PROT 6.6  ALBUMIN 3.2*   No results for input(s): LIPASE, AMYLASE in the last 168 hours. No results for input(s):  AMMONIA in the last 168 hours. Coagulation Profile: No results for input(s): INR, PROTIME in the last 168 hours. Cardiac Enzymes: No results for input(s): CKTOTAL, CKMB, CKMBINDEX, TROPONINI in the last 168 hours. BNP (last 3 results) No results for input(s): PROBNP in the last 8760 hours. HbA1C: Recent Labs    09/21/19 0557  HGBA1C 5.3   CBG: Recent Labs  Lab 09/21/19 1132 09/21/19 1522 09/21/19 2121 09/22/19 0753 09/22/19 1157  GLUCAP 89 92 126* 88 106*   Lipid Profile: No results for input(s): CHOL, HDL, LDLCALC, TRIG, CHOLHDL, LDLDIRECT in the last 72 hours. Thyroid Function Tests: No results for input(s): TSH, T4TOTAL, FREET4, T3FREE, THYROIDAB in the last 72 hours. Anemia Panel: No results for input(s): VITAMINB12, FOLATE, FERRITIN, TIBC, IRON, RETICCTPCT in the last 72 hours. Urine analysis:    Component Value Date/Time   COLORURINE YELLOW 09/19/2019 1749   APPEARANCEUR HAZY (A) 09/19/2019 1749   LABSPEC 1.011 09/19/2019 1749   PHURINE 6.0 09/19/2019 1749   GLUCOSEU NEGATIVE 09/19/2019 1749   HGBUR LARGE (A) 09/19/2019 1749   BILIRUBINUR NEGATIVE 09/19/2019 1749   KETONESUR NEGATIVE 09/19/2019 1749   PROTEINUR NEGATIVE 09/19/2019 1749   UROBILINOGEN 1.0 01/16/2010 0200   NITRITE NEGATIVE 09/19/2019 1749   LEUKOCYTESUR NEGATIVE 09/19/2019 1749     Benen Weida M.D. [facility] 09/22/2019, 2:14 PM   Call night coverage person covering after 7pm

## 2019-09-22 NOTE — Care Management Important Message (Signed)
Important Message  Patient Details IM Letter given to Sharol Roussel RN Case Manager to present to the Patient Name: Mark Bullock MRN: [id number] Date of Birth: 02/23/1929   Medicare Important Message Given:  Yes     Caren Macadam 09/22/2019, 11:37 AM

## 2019-09-22 NOTE — Consult Note (Signed)
WOC Nurse Consult Note: Reason for Consult: Consult requested for bilat legs.  Pt is familiar to [facility] team from previous admission.  He has chronic edema to bilat legs and recently developed cellulitis, erythremia, and weeping to legs, according to progress notes.   Wound type: Bilat legs with generalized edema, no further erythemia Right leg is dry peeling skin, no open wounds or drainage Left leg with mod amt tan drainage, patchy areas of full thickness wounds scattered across anterior and posterior calf; yellow and moist.  Affected area is approx 4X4X.2cm Pressure Injury POA: Yes/No/NA Dressing procedure/placement/frequency: Topical treatment orders provided for bedside nurses to perform daily as follows to decrease edema and promote drying and healing: Bedside nurse: please change bilat leg dressings Q day as follows:  Wash right leg, pat dry, then apply lotion.  Apply kerlex in a spiral fashion, beginning just behind toes, to below knee, then ace wrap in the same manner.  Wash left leg, pat dry, then apply lotion.  Apply xeroform gauze to open areaskerlex in a spiral fashion, beginning just behind toes, to below knee, then ace wrap in the same manner. Please re-consult if further assistance is needed.  Thank-you,  Cammie Mcgee MSN, RN, CWOCN, [place], CNS [phone]

## 2019-09-23 DIAGNOSIS — I5023 Acute on chronic systolic (congestive) heart failure: Secondary | ICD-10-CM

## 2019-09-23 DIAGNOSIS — E119 Type 2 diabetes mellitus without complications: Secondary | ICD-10-CM

## 2019-09-23 DIAGNOSIS — R627 Adult failure to thrive: Secondary | ICD-10-CM

## 2019-09-23 LAB — GLUCOSE, CAPILLARY
Glucose-Capillary: 95 mg/dL (ref 70–99)
Glucose-Capillary: 133 mg/dL — ABNORMAL HIGH (ref 70–99)
Glucose-Capillary: 105 mg/dL — ABNORMAL HIGH (ref 70–99)
Glucose-Capillary: 110 mg/dL — ABNORMAL HIGH (ref 70–99)

## 2019-09-23 LAB — CBC
HCT: 39.2 % (ref 39.0–52.0)
Hemoglobin: 12.3 g/dL — ABNORMAL LOW (ref 13.0–17.0)
MCH: 28.3 pg (ref 26.0–34.0)
MCHC: 31.4 g/dL (ref 30.0–36.0)
MCV: 90.1 fL (ref 80.0–100.0)
Platelets: 202 10*3/uL (ref 150–400)
RBC: 4.35 MIL/uL (ref 4.22–5.81)
RDW: 16.9 % — ABNORMAL HIGH (ref 11.5–15.5)
WBC: 6.7 10*3/uL (ref 4.0–10.5)
nRBC: 0 % (ref 0.0–0.2)

## 2019-09-23 LAB — BASIC METABOLIC PANEL
Anion gap: 10 (ref 5–15)
BUN: 12 mg/dL (ref 8–23)
CO2: 30 mmol/L (ref 22–32)
Calcium: 8.9 mg/dL (ref 8.9–10.3)
Chloride: 99 mmol/L (ref 98–111)
Creatinine, Ser: 1.16 mg/dL (ref 0.61–1.24)
GFR calc Af Amer: >60 mL/min (ref >60)
GFR calc non Af Amer: 56 mL/min — ABNORMAL LOW (ref >60)
Glucose, Bld: 104 mg/dL — ABNORMAL HIGH (ref 70–99)
Potassium: 3.9 mmol/L (ref 3.5–5.1)
Sodium: 139 mmol/L (ref 135–145)

## 2019-09-23 MED ORDER — SENNOSIDES-DOCUSATE SODIUM 8.6-50 MG PO TABS
1.0000 | ORAL_TABLET | Freq: Two times a day (BID) | ORAL | Status: DC
  Administered 2019-09-24 – 2019-09-25 (×2): 1 via ORAL
  Filled 2019-09-23 (×3): qty 1

## 2019-09-23 NOTE — [place] FL2 (Signed)
[place] MEDICAID FL2 LEVEL OF CARE SCREENING TOOL     IDENTIFICATION  Patient Name: Mark Bullock Birthdate: 06/10/1929 Sex: male Admission Date (Current Location): 09/19/2019  [facility] and [place] Number:  Producer, television/film/video and Address:  [facility],  [address] [place], [place] [address]      Provider Number: [id number]  Attending Physician Name and Address:  Albertine Grates, MD  Relative Name and Phone Number:  Blair Hailey, daughter, [phone]    Current Level of Care: Hospital Recommended Level of Care: Skilled Nursing Facility Prior Approval Number:    Date Approved/Denied:   PASRR Number: [id number]  Discharge Plan: SNF    Current Diagnoses: Patient Active Problem List   Diagnosis Date Noted  . Goals of care, counseling/discussion   . Palliative care encounter   . Cellulitis of both lower extremities 09/19/2019  . Cellulitis 09/19/2019  . Chronic systolic CHF (congestive heart failure) (HCC) 03/30/2019  . Leg DVT (deep venous thromboembolism), acute, left (HCC) 03/30/2019  . Acute pulmonary embolism without acute cor pulmonale (HCC) 03/30/2019  . Cellulitis of leg, left 03/29/2019  . Chronic cerebrovascular accident (CVA) 07/07/2017  . Essential hypertension, benign 05/24/2017  . Type II diabetes mellitus with stage 3 chronic kidney disease (HCC) 05/24/2017  . Dyslipidemia associated with type 2 diabetes mellitus (HCC) 05/24/2017  . Vascular dementia without behavioral disturbance (HCC) 05/24/2017  . Vitamin B 12 deficiency 05/24/2017  . Rhabdomyolysis 05/19/2017  . AKI (acute kidney injury) (HCC) 05/18/2017  . Elevated troponin 05/18/2017  . History of completed stroke 05/18/2017  . CKD (chronic kidney disease), stage III 05/18/2017  . Physical deconditioning 05/08/2017  . Osteoarthritis 05/08/2017  . Bilateral lower extremity edema   . Abnormal EKG   . Elevated brain natriuretic peptide (BNP) level     Orientation RESPIRATION  BLADDER Height & Weight     Self, Place  Normal External catheter Weight: 77.1 kg Height:     BEHAVIORAL SYMPTOMS/MOOD NEUROLOGICAL BOWEL NUTRITION STATUS  (none) (none) Continent Diet(see d/c sum mary)  AMBULATORY STATUS COMMUNICATION OF NEEDS Skin   Extensive Assist Verbally Normal                       Personal Care Assistance Level of Assistance  Bathing, Feeding, Dressing Bathing Assistance: Maximum assistance Feeding assistance: Independent Dressing Assistance: Limited assistance     Functional Limitations Info  Sight, Hearing, Speech Sight Info: Adequate Hearing Info: Adequate Speech Info: Adequate    SPECIAL CARE FACTORS FREQUENCY  PT (By licensed PT), OT (By licensed OT)     PT Frequency: 5X/W OT Frequency: 5X/W            Contractures Contractures Info: Not present    Additional Factors Info  Code Status, Allergies Code Status Info: full Allergies Info: NKA           Current Medications (09/23/2019):  This is the current hospital active medication list Current Facility-Administered Medications  Medication Dose Route Frequency Provider Last Rate Last Admin  . acetaminophen (TYLENOL) tablet 650 mg  650 mg Oral Q6H PRN Eduard Clos, MD       Or  . acetaminophen (TYLENOL) suppository 650 mg  650 mg Rectal Q6H PRN Eduard Clos, MD      . apixaban Everlene Balls) tablet 5 mg  5 mg Oral BID Eduard Clos, MD   5 mg at 09/23/19 1038  . ceFAZolin (ANCEF) IVPB 2g/100 mL premix  2  g Intravenous Q8H Rai, Ripudeep Raliegh Ip, MD   Stopped at 09/23/19 1526  . clopidogrel (PLAVIX) tablet 75 mg  75 mg Oral Daily Rise Patience, MD   75 mg at 09/23/19 1037  . feeding supplement (ENSURE ENLIVE) (ENSURE ENLIVE) liquid 237 mL  237 mL Oral BID BM Rise Patience, MD   237 mL at 09/23/19 1036  . furosemide (LASIX) injection 20 mg  20 mg Intravenous Q12H Rai, Ripudeep K, MD   20 mg at 09/23/19 0815  . insulin aspart (novoLOG) injection 0-9 Units  0-9  Units Subcutaneous TID WC Rise Patience, MD   1 Units at 09/23/19 1159  . ondansetron (ZOFRAN) tablet 4 mg  4 mg Oral Q6H PRN Rise Patience, MD       Or  . ondansetron [facility]) injection 4 mg  4 mg Intravenous Q6H PRN Rise Patience, MD      . polyethylene glycol (MIRALAX / GLYCOLAX) packet 17 g  17 g Oral Daily Rise Patience, MD   17 g at 09/23/19 1036  . potassium chloride (KLOR-CON) CR tablet 10 mEq  10 mEq Oral Daily Rise Patience, MD   10 mEq at 09/23/19 1037  . senna-docusate (Senokot-S) tablet 1 tablet  1 tablet Oral BID Florencia Reasons, MD      . simvastatin (ZOCOR) tablet 20 mg  20 mg Oral Daily Rise Patience, MD   20 mg at 09/23/19 1037  . tamsulosin (FLOMAX) capsule 0.4 mg  0.4 mg Oral QHS Rise Patience, MD   0.4 mg at 09/22/19 2146  . traMADol (ULTRAM) tablet 50 mg  50 mg Oral Q6H PRN Rise Patience, MD   50 mg at 09/20/19 0717  . vitamin B-12 (CYANOCOBALAMIN) tablet 100 mcg  100 mcg Oral Daily Rise Patience, MD   100 mcg at 09/23/19 1037     Discharge Medications: Please see discharge summary for a list of discharge medications.  Relevant Imaging Results:  Relevant Lab Results:   Additional Information [id number]  Trish Mage, [place]

## 2019-09-23 NOTE — TOC Progression Note (Signed)
Transition of Care [facility]) - Progression Note    Patient Details  Name: Mark Bullock MRN: [id number] Date of Birth: [birth year]  [facility]) CM/SW Contact  Ida Rogue, [place] Phone Number: 09/23/2019, 2:29 PM  Clinical Narrative:   Spoke to daughter who states that if patient is determined to return to [place], that is likely the only plan that he will cooperate with. Confirmed with Chip Boer that he is, in fact, living in ALF, and that they use [facility] for PT,OT.  They will need order for these services at d/c.  TOC will continue to follow during the course of hospitalization.     Expected Discharge Plan: Home w Home Health Services Barriers to Discharge: No Barriers Identified  Expected Discharge Plan and Services Expected Discharge Plan: Home w Home Health Services   Discharge Planning Services: CM Consult   Living arrangements for the past 2 months: Independent Living Facility                                       Social Determinants of Health (SDOH) Interventions    Readmission Risk Interventions No flowsheet data found.

## 2019-09-23 NOTE — TOC Initial Note (Signed)
Transition of Care [facility]) - Initial/Assessment Note    Patient Details  Name: Mark Bullock MRN: [id number] Date of Birth: 1928-09-18  Transition of Care [facility]) CM/SW Contact:    Ida Rogue, LCSW Phone Number: 09/23/2019, 11:20 AM  Clinical Narrative:   Patient here for cellulitis of both legs was seen in response to PT recommendation of SNF.  Mark Bullock is a resident at [facility] in an apartment.  He states he is worried about his belongings as there is not a lock on his door, and would prefer to return there.  He is open to [facility] PT, understands that he would be getting it less at [facility] than if he were to go to rehab.  Gave permission to talk to his daughter about disposition.  Left a message for [place] per his instructions.  TOC will continue to follow during the course of hospitalization.                Expected Discharge Plan: [facility] PT v SNF) Barriers to Discharge: Other (comment)(Dispostional plan unidentified currently)   Patient Goals and CMS Choice        Expected Discharge Plan and Services Expected Discharge Plan: (HH PT v SNF)   Discharge Planning Services: CM Consult   Living arrangements for the past 2 months: Independent Living Facility                                      Prior Living Arrangements/Services Living arrangements for the past 2 months: Independent Living Facility Lives with:: Facility Resident Patient language and need for interpreter reviewed:: Yes Do you feel safe going back to the place where you live?: Yes      Need for Family Participation in Patient Care: Yes (Comment) Care giver support system in place?: Yes (comment)   Criminal Activity/Legal Involvement Pertinent to Current Situation/Hospitalization: No - Comment as needed  Activities of Daily Living Home Assistive Devices/Equipment: None ADL Screening (condition at time of admission) Patient's cognitive ability adequate to safely complete daily activities?:  Yes Is the patient deaf or have difficulty hearing?: No Does the patient have difficulty seeing, even when wearing glasses/contacts?: No Does the patient have difficulty concentrating, remembering, or making decisions?: No Patient able to express need for assistance with ADLs?: Yes Does the patient have difficulty dressing or bathing?: No Independently performs ADLs?: No Communication: Independent Dressing (OT): Needs assistance Is this a change from baseline?: Pre-admission baseline Grooming: Independent Feeding: Independent Bathing: Independent Toileting: Independent In/Out Bed: Independent Walks in Home: Independent Does the patient have difficulty walking or climbing stairs?: Yes Weakness of Legs: None Weakness of Arms/Hands: None  Permission Sought/Granted Permission sought to share information with : Family Supports Permission granted to share information with : Yes, Verbal Permission Granted  Share Information with NAME: Blair Hailey     Permission granted to share info w Relationship: daughter     Emotional Assessment Appearance:: Appears stated age Attitude/Demeanor/Rapport: Engaged Affect (typically observed): Appropriate Orientation: : Oriented to Self, Oriented to Place, Oriented to Situation Alcohol / Substance Use: Not Applicable Psych Involvement: No (comment)  Admission diagnosis:  Cellulitis [L03.90] Cellulitis of foot [L03.119] Edema, unspecified type [R60.9] Patient Active Problem List   Diagnosis Date Noted  . Goals of care, counseling/discussion   . Palliative care encounter   . Cellulitis of both lower extremities 09/19/2019  . Cellulitis 09/19/2019  . Chronic systolic CHF (congestive  heart failure) ([place]) 03/30/2019  . Leg DVT (deep venous thromboembolism), acute, left ([place]) 03/30/2019  . Acute pulmonary embolism without acute cor pulmonale ([place]) 03/30/2019  . Cellulitis of leg, left 03/29/2019  . Chronic cerebrovascular accident (CVA)  07/07/2017  . Essential hypertension, benign 05/24/2017  . Type II diabetes mellitus with stage 3 chronic kidney disease ([place]) 05/24/2017  . Dyslipidemia associated with type 2 diabetes mellitus ([place]) 05/24/2017  . Vascular dementia without behavioral disturbance ([place]) 05/24/2017  . Vitamin B 12 deficiency 05/24/2017  . Rhabdomyolysis 05/19/2017  . AKI (acute kidney injury) ([place]) 05/18/2017  . Elevated troponin 05/18/2017  . History of completed stroke 05/18/2017  . CKD (chronic kidney disease), stage III 05/18/2017  . Physical deconditioning 05/08/2017  . Osteoarthritis 05/08/2017  . Bilateral lower extremity edema   . Abnormal EKG   . Elevated brain natriuretic peptide (BNP) level    PCP:  Lorene Dy, MD Pharmacy:   [id number] [address], [place] [address] Phone: [phone] Fax: [phone]     Social Determinants of Health (SDOH) Interventions    Readmission Risk Interventions No flowsheet data found.

## 2019-09-23 NOTE — Evaluation (Signed)
Physical Therapy Evaluation Patient Details Name: Mark Bullock MRN: [id number] DOB: January 27, 1929 Today's Date: 09/23/2019   History of Present Illness  86 year old male admitted with cellulitis of both LEs, and some necrosis; PMH very complex, but does include PE, DVT, EF 35-40%, CVA, DM2, left sided weakness , BPH, B12 deficiency.- see chart for additional lists  Clinical Impression  Patient presents with flat affect, but does tend to joke. He does easily get offended. He lives in [facility], and relates that he uses RW, and "sometimes two walking sticks"; He indicates that he "is fairly independent but cannot easily put his shoes and socks on due to toenails being so long and feet painful". In his apt, has walk in shower with seat, grab bars, and commode is elevated. He is unsteady and has reduced safety awareness as well as reduced insight into his medical situation. Should benefit from PT to further address optimal functional outcomes. At end of session- having ambulated bed to [facility] chair, he was left in chair, with LEs elevated, LEs over 2 pillows to further elevate, remote/call light, telephone in reach, covers on lap, chair alarm activated, and BS table over his lap.    Follow Up Recommendations SNF    Equipment Recommendations  (has RW and "walking sticks " at home)    Recommendations for Other Services       Precautions / Restrictions Precautions Precautions: Fall Precaution Comments: He has reduced safety awareness. Hindered by feet/ankles edematous and painful- some necrosis. Restrictions Weight Bearing Restrictions: No      Mobility  Bed Mobility Overal bed mobility: Needs Assistance Bed Mobility: Rolling;Sidelying to Sit;Supine to Sit;Sit to Supine;Sit to Sidelying Rolling: Min guard;Min assist;+2 for physical assistance Sidelying to sit: Min guard;Min assist;+2 for physical assistance Supine to sit: Min guard;Min assist;+2 for physical  assistance Sit to supine: Min guard;Min assist;+2 for physical assistance Sit to sidelying: Min guard;Min assist;+2 for physical assistance    Transfers Overall transfer level: Needs assistance Equipment used: Rolling walker (2 wheeled)(Assist of 2) Transfers: Sit to/from Stand Sit to Stand: Min guard;Min assist;+2 physical assistance            Ambulation/Gait Ambulation/Gait assistance: Min guard;Min assist;+2 physical assistance Gait Distance (Feet): 8 Feet(bed to BS chair with mod/max cues for safety) Assistive device: Rolling walker (2 wheeled) Gait Pattern/deviations: Wide base of support;Shuffle   Gait velocity interpretation: 1.31 - 2.62 ft/sec, indicative of limited community Insurance account manager [employer] (Stroke Patients Only)       Balance Overall balance assessment: Needs assistance   Sitting balance-Leahy Scale: Fair(posterior lean) Sitting balance - Comments: F, F+ Postural control: Posterior lean Standing balance support: Bilateral upper extremity supported Standing balance-Leahy Scale: Fair                               Pertinent Vitals/Pain Pain Assessment: 0-10 Pain Score: 5  Pain Location: both feet- states his toenails are so long and he has asked nursing about this- edema in both LEs and some necrosis per chart Pain Descriptors / Indicators: Aching;Burning;Constant;Pressure;Heaviness    Home Living Family/patient expects to be discharged to:: Other (Comment)                 Additional Comments: Lives in [facility]- apt type setting. States his daughter assists with  any necessary driving- groceries (he likes [employer] fast food), and appts    Prior Function Level of Independence: Independent with assistive device(s)         Comments: Has 2 canes at home, and a RW     Hand Dominance   Dominant Hand: Right    Extremity/Trunk Assessment         Lower Extremity Assessment Lower Extremity Assessment: Generalized weakness    Cervical / Trunk Assessment Cervical / Trunk Assessment: Kyphotic  Communication   Communication: No difficulties  Cognition Arousal/Alertness: Awake/alert Behavior During Therapy: WFL for tasks assessed/performed;Flat affect(but does like to tell jokes- has reduced insight into his medical situation and safety awareness) Overall Cognitive Status: Within Functional Limits for tasks assessed                                 General Comments: He is unsteady- hindered due to edema in both LEs, feet/ ankles and pain- required cues for use of RW- and yet he says he uses "walking sticks (2) at home" as well as a RW      General Comments General comments (skin integrity, edema, etc.): Monitor closely for skin and joint integrity- LE edematous as well as feet /ankles- per chart, some necrosis-    Exercises Other Exercises Other Exercises: reminded to do ankle pumps and will bring printed sheet for ex to instruct in  next visit.   Assessment/Plan    PT Assessment Patient needs continued PT services  PT Problem List Decreased strength;Decreased activity tolerance;Decreased mobility;Decreased safety awareness       PT Treatment Interventions Gait training;DME instruction;Functional mobility training;Therapeutic activities;Therapeutic exercise;Patient/family education    PT Goals (Current goals can be found in the Care Plan section)  Acute Rehab PT Goals Patient Stated Goal: Just to get well and do something about my toenails- they are uncomfortable, and I cannot even put my socks on myself because of them being so long. PT Goal Formulation: With patient Time For Goal Achievement: 10/07/19 Potential to Achieve Goals: Good    Frequency Min 2X/week   Barriers to discharge        Co-evaluation PT/OT/SLP Co-Evaluation/Treatment: Yes Reason for Co-Treatment: Complexity of the patient's  impairments (multi-system involvement);For patient/therapist safety PT goals addressed during session: Mobility/safety with mobility;Proper use of DME         AM-PAC PT "6 Clicks" Mobility  Outcome Measure Help needed turning from your back to your side while in a flat bed without using bedrails?: A Little Help needed moving from lying on your back to sitting on the side of a flat bed without using bedrails?: A Little Help needed moving to and from a bed to a chair (including a wheelchair)?: A Little Help needed standing up from a chair using your arms (e.g., wheelchair or bedside chair)?: A Lot Help needed to walk in hospital room?: A Lot Help needed climbing 3-5 steps with a railing? : A Lot 6 Click Score: 15    End of Session   Activity Tolerance: Patient limited by pain Patient left: in chair;with call bell/phone within reach;with chair alarm set   PT Visit Diagnosis: Unsteadiness on feet (R26.81);Other abnormalities of gait and mobility (R26.89);Muscle weakness (generalized) (M62.81)    Time: 4782-9562 PT Time Calculation (min) (ACUTE ONLY): 34 min   Charges: 1 x visit    Eval (Mod level)  One unit therapeutic activity              Harriett Rush, PT # [phone] CGV cell   Ephriam Jenkins 09/23/2019, 11:26 AM

## 2019-09-23 NOTE — Evaluation (Signed)
Occupational Therapy Evaluation Patient Details Name: Mark Bullock MRN: [id number] DOB: 06-28-1929 Today's Date: 09/23/2019    History of Present Illness 86 year old male admitted with cellulitis of both LEs, and some necrosis; PMH very complex, but does include PE, DVT, EF 35-40%, CVA, DM2, left sided weakness , BPH, B12 deficiency.- see chart for additional lists   Clinical Impression   Pt admitted with the above diagnoses and presents with below problem list. Pt will benefit from continued acute OT to address the below listed deficits and maximize independence with basic ADLs prior to d/c to venue below. PTA pt was living in ILF, used cane for mobility. Pt currently +2 min A for stand pivot transfers and LB ADLs.      Follow Up Recommendations  SNF    Equipment Recommendations  Other (comment)(defer to next venue)    Recommendations for Other Services       Precautions / Restrictions Precautions Precautions: Fall Precaution Comments: He has reduced safety awareness. Hindered by feet/ankles edematous and painful- some necrosis. Restrictions Weight Bearing Restrictions: No      Mobility Bed Mobility Overal bed mobility: Needs Assistance Bed Mobility: Rolling;Sidelying to Sit;Supine to Sit;Sit to Supine;Sit to Sidelying Rolling: Min guard;Min assist;+2 for physical assistance Sidelying to sit: Min guard;Min assist;+2 for physical assistance Supine to sit: Min guard;Min assist;+2 for physical assistance Sit to supine: Min guard;Min assist;+2 for physical assistance Sit to sidelying: Min guard;Min assist;+2 for physical assistance    Transfers Overall transfer level: Needs assistance Equipment used: Rolling walker (2 wheeled) Transfers: Sit to/from [employer] Sit to Stand: Min assist;+2 physical assistance;Min guard Stand pivot transfers: Min assist;+2 physical assistance       General transfer comment: steadying assist of 2, assist to stabilize  rw     Balance Overall balance assessment: Needs assistance   Sitting balance-Leahy Scale: Fair Sitting balance - Comments: F, F+ Postural control: Posterior lean Standing balance support: Bilateral upper extremity supported Standing balance-Leahy Scale: Poor Standing balance comment: BUE support needed in static stand                           ADL either performed or assessed with clinical judgement   ADL Overall ADL's : Needs assistance/impaired Eating/Feeding: Set up;Sitting   Grooming: Set up;Sitting   Upper Body Bathing: Minimal assistance;Sitting   Lower Body Bathing: Moderate assistance;+2 for physical assistance;+2 for safety/equipment;Sit to/from stand   Upper Body Dressing : Minimal assistance;Sitting   Lower Body Dressing: Moderate assistance;+2 for physical assistance;+2 for safety/equipment;Sit to/from stand   Toilet Transfer: Minimal assistance;+2 for physical assistance;Stand-pivot;BSC;RW Toilet Transfer Details (indicate cue type and reason): simulated with EOB>recliner Toileting- Clothing Manipulation and Hygiene: +2 for safety/equipment;+2 for physical assistance;Minimal assistance         General ADL Comments: Pt completed bed mobility and stand pivot transfer EOB>rehab. min A +2 to steady and to stabilize walker as pt has tendency towards posterior lean     Vision         Perception     Praxis      Pertinent Vitals/Pain Pain Assessment: 0-10 Pain Score: 5  Pain Location: both feet- states his toenails are so long and he has asked nursing about this- edema in both LEs and some necrosis per chart Pain Descriptors / Indicators: Aching;Burning;Constant;Pressure;Heaviness Pain Intervention(s): Monitored during session;Repositioned;Limited activity within patient's tolerance     Hand Dominance Right   Extremity/Trunk Assessment Upper Extremity Assessment  Upper Extremity Assessment: Generalized weakness   Lower Extremity  Assessment Lower Extremity Assessment: Defer to PT evaluation   Cervical / Trunk Assessment Cervical / Trunk Assessment: Kyphotic   Communication Communication Communication: No difficulties   Cognition Arousal/Alertness: Awake/alert Behavior During Therapy: Flat affect(but does like to tell jokes- has reduced insight into his me) Overall Cognitive Status: No family/caregiver present to determine baseline cognitive functioning                                 General Comments: He is unsteady- hindered due to edema in both LEs, feet/ ankles and pain- required cues for use of RW- and yet he says he uses "walking sticks (2) at home" as well as a RW   General Comments  Monitor closely for skin and joint integrity- LE edematous as well as feet /ankles- per chart, some necrosis-    Exercises Other Exercises Other Exercises: reminded to do ankle pumps and will bring printed sheet for ex to instruct in  next visit.   Shoulder Instructions      Home Living Family/patient expects to be discharged to:: Other (Comment)                                 Additional Comments: Lives in [facility]- apt type setting. States his daughter assists with any necessary driving- groceries (he likes [employer] fast food), and appts      Prior Functioning/Environment Level of Independence: Independent with assistive device(s)        Comments: Has 2 canes at home, and a RW        OT Problem List: Decreased strength;Decreased activity tolerance;Impaired balance (sitting and/or standing);Decreased cognition;Decreased safety awareness;Decreased knowledge of use of DME or AE;Decreased knowledge of precautions;Pain;Increased edema      OT Treatment/Interventions: Self-care/ADL training;Therapeutic exercise;DME and/or AE instruction;Therapeutic activities;Patient/family education;Balance training    OT Goals(Current goals can be found in the care plan section)  Acute Rehab OT Goals Patient Stated Goal: Just to get well and do something about my toenails- they are uncomfortable, and I cannot even put my socks on myself because of them being so long. OT Goal Formulation: With patient Time For Goal Achievement: 10/07/19 Potential to Achieve Goals: Good ADL Goals Pt Will Perform Grooming: with modified independence;sitting Pt Will Perform Lower Body Bathing: with modified independence;sit to/from stand Pt Will Perform Lower Body Dressing: with modified independence;sit to/from stand Pt Will Transfer to Toilet: with min guard assist;ambulating Pt Will Perform Toileting - Clothing Manipulation and hygiene: with min guard assist;sit to/from stand Pt Will Perform Tub/Shower Transfer: with min guard assist;ambulating;rolling walker;shower seat  OT Frequency: Min 2X/week   Barriers to D/C:            Co-evaluation PT/OT/SLP Co-Evaluation/Treatment: Yes Reason for Co-Treatment: Complexity of the patient's impairments (multi-system involvement);For patient/therapist safety PT goals addressed during session: Mobility/safety with mobility;Proper use of DME OT goals addressed during session: Proper use of Adaptive equipment and DME      AM-PAC OT "6 Clicks" Daily Activity     Outcome Measure Help from another person eating meals?: None Help from another person taking care of personal grooming?: None Help from another person toileting, which includes using toliet, bedpan, or urinal?: A Lot Help from another person bathing (including washing, rinsing, drying)?: A Lot Help from another person to put on and  taking off regular upper body clothing?: A Little Help from another person to put on and taking off regular lower body clothing?: A Lot 6 Click Score: 17   End of Session Equipment Utilized During Treatment: Gait belt;Rolling walker Nurse Communication: Mobility status(spoke with NT)  Activity Tolerance: Patient tolerated treatment well;Patient  limited by pain Patient left: in chair;with chair alarm set;with call bell/phone within reach  OT Visit Diagnosis: Unsteadiness on feet (R26.81)                Time: 1657-9038 OT Time Calculation (min): 28 min Charges:  OT General Charges $OT Visit: 1 Visit OT Evaluation $OT Eval Moderate Complexity: [place], OT Acute Rehabilitation Services Pager: [phone] Office: [phone]   Hortencia Pilar 09/23/2019, 2:08 PM

## 2019-09-23 NOTE — Progress Notes (Signed)
PROGRESS NOTE  Mark Bullock YYF:[id number] DOB: 05-13-1929 DOA: 09/19/2019 PCP: Lorene Dy, MD Brief history:  Patient is a 85 year old male with history of PE, DVT, chronic systolic CHF, EF 35 to 56%, diabetes mellitus type 2, history of CVA with left-sided weakness, hypertension, BPH, B12 deficiency presented to ED after found to have increasing swelling of the both lower extremities extending up to the thigh and scrotum, increasing skin breakdown concerning for cellulitis.  Per report, patient has not been very cooperative with taking his medications.  He is from [facility] independent [facility] facility. Patient was admitted for acute on chronicCHF exacerbation, cellulitis of the lower extremities   HPI/Recap of past 24 hours:  Remains very weak, denies leg pain, no fever Complaining of being constipated 1.7 L urine output documented last 24 hours, creatinine stable  Assessment/Plan: Principal Problem:   Cellulitis of both lower extremities Active Problems:   CKD (chronic kidney disease), stage III   Type II diabetes mellitus with stage 3 chronic kidney disease (HCC)   Vascular dementia without behavioral disturbance (HCC)   Chronic systolic CHF (congestive heart failure) (HCC)   Leg DVT (deep venous thromboembolism), acute, left (HCC)   Cellulitis   Palliative care encounter   Goals of care, counseling/discussion   Principal Problem:   Cellulitis of both lower extremities -Patient appears to have some superimposed bacterial infection on chronic venous stasis and skin breakdown. -ABIs in the normal range, MRI of the left foot no osteomyelitis, does show" Severe dorsal foot soft tissue swelling as can be seen with chronic venous insufficiency and/or cellulitis." -Cellulitis improving, narrow IV antibiotics, vancomycin and Zosyn to IV cefazolin -Continue IV Lasix, edema improving , no leukocytosis, no fever, blood culture no growth -Appreciate wound care  recommendations, Unna boots applied on March 15, elevate legs  Active Problems: Acute on chronic combined systolic and diastolic CHF exacerbation -Previous 2D echo 03/2019 had shown EF of 35 to 40%, impaired relaxation LV diastolic filling, noncompliance with Lasix -Continue IV Lasix 20 mg every 12 hours, (outpatient on 20 mg daily) -Negative balance of 3.4 L.,  Holding lisinopril to allow diuresis -Creatinine currently stable, transition to oral Lasix in next 24 to 48 hours  Mild acute on CKD (chronic kidney disease), stage IIIa -Baseline 0.8, presented with creatinine of 1.2 -Creatinine improving with diuresis, -Monitor BMP, renal dosing meds     Type II diabetes mellitus with stage 3 chronic kidney disease ([place]), diet controlled -CBGs currently controlled -Hemoglobin A1c 5.3, continue sliding scale insulin  Constipation -Received enema on 3/13, continue MiraLAX daily, add on Senokot  BPH -Continue Flomax  History of DVT (deep venous thromboembolism), PE -Continue apixaban  Hyperlipidemia Continue statin  Anemia of chronic disease likely due to CKDIII -H&H currently at baseline  Dementia  -Currently no acute agitation or delirium, mental status improving from yesterday per previous documentation, today on March 16 he appears oriented x3 -Will benefit from outpatient palliative care to follow. -Appreciate palliative medicine goals of care meeting and recommendations.,  Details please note from March to 14th  Failure to thrive/generalized weakness -From [facility] assisted [facility] -PT recommended SNF  Code Status: Full CODE STATUS DVT Prophylaxis: Apixaban   Family Communication: patient   Disposition Plan:    Patient came from:                  ALF  Anticipated d/c place:  Return to ALF VS SNF  Barriers to d/c OR conditions which need to be met to effect a safe d/c:   Remain on IV Lasix and IV antibiotics, likely able to transition to oral in the next 24 to 48 hours,  remain very weak, Likely will need SNF level of care, social worker involved   Consultants:  Palliative care  PT  Social worker  Procedures:  None  Antibiotics:  IV Ancef   Objective: BP 93/80 (BP Location: Left Arm)   Pulse 72   Temp 98.3 F (36.8 C)   Resp 17   Wt 77.1 kg   SpO2 99%   BMI 25.84 kg/m   Intake/Output Summary (Last 24 hours) at 09/23/2019 1131 Last data filed at 09/23/2019 0900 Gross per 24 hour  Intake 870 ml  Output 1775 ml  Net -905 ml   Filed Weights   09/21/19 0532 09/22/19 0537 09/23/19 0512  Weight: 85.1 kg 84.2 kg 77.1 kg    Exam: Patient is examined daily including today on 09/23/2019, exams remain the same as of yesterday except that has changed    General:  NAD, AAOx3  Cardiovascular: RRR  Respiratory: CTABL  Abdomen: Soft/ND/NT, positive BS  Musculoskeletal: Lower extremity edema, now with Unna boots on  Neuro: alert, oriented x3  Data Reviewed: Basic Metabolic Panel: Recent Labs  Lab 09/19/19 1749 09/20/19 0432 09/21/19 0557 09/22/19 0536 09/23/19 0638  NA 139 137 138 135 139  K 3.8 3.8 3.5 3.7 3.9  CL 101 104 103 100 99  CO2 '29 26 26 27 30  ' GLUCOSE 129* 136* 71 94 104*  BUN '17 17 14 16 12  ' CREATININE 1.21 1.01 1.07 0.98 1.16  CALCIUM 9.0 8.4* 8.4* 8.4* 8.9   Liver Function Tests: Recent Labs  Lab 09/19/19 1749  AST 14*  ALT 28  ALKPHOS 52  BILITOT 2.3*  PROT 6.6  ALBUMIN 3.2*   No results for input(s): LIPASE, AMYLASE in the last 168 hours. No results for input(s): AMMONIA in the last 168 hours. CBC: Recent Labs  Lab 09/19/19 1749 09/20/19 0432 09/21/19 0557 09/22/19 0536 09/23/19 0638  WBC 8.4 6.9 5.9 7.5 6.7  NEUTROABS 7.0  --   --   --   --   HGB 11.5* 10.4* 10.5* 11.0* 12.3*  HCT 36.7* 33.0* 33.6* 35.0* 39.2  MCV 90.0 89.4 88.0 88.2 90.1  PLT 220 194 191 206 202   Cardiac Enzymes:    No results for input(s): CKTOTAL, CKMB, CKMBINDEX, TROPONINI in the last 168 hours. BNP (last 3 results) Recent Labs    03/28/19 2249 09/19/19 1749  BNP 88.0 69.0    ProBNP (last 3 results) No results for input(s): PROBNP in the last 8760 hours.  CBG: Recent Labs  Lab 09/22/19 0753 09/22/19 1157 09/22/19 1614 09/22/19 1958 09/23/19 0737  GLUCAP 88 106* 133* 110* 95    Recent Results (from the past 240 hour(s))  Culture, blood (routine x 2)     Status: None (Preliminary result)   Collection Time: 09/19/19  5:49 PM   Specimen: BLOOD  Result Value Ref Range Status   Specimen Description   Final    BLOOD LEFT ASSIST CONTROL Performed at [facility] [address], [place], [place] [address]    Special Requests   Final    BOTTLES DRAWN AEROBIC AND ANAEROBIC Blood Culture adequate volume Performed at [facility] [address], [place], [place] [address]  Culture   Final    NO GROWTH 4 DAYS Performed at [facility], [place] [address], [place], [place] [address]    Report Status PENDING  Incomplete  SARS CORONAVIRUS 2 (TAT 6-24 HRS) Nasopharyngeal Nasopharyngeal Swab     Status: None   Collection Time: 09/19/19  5:50 PM   Specimen: Nasopharyngeal Swab  Result Value Ref Range Status   SARS Coronavirus 2 NEGATIVE NEGATIVE Final    Comment: (NOTE) SARS-CoV-2 target nucleic acids are NOT DETECTED. The SARS-CoV-2 RNA is generally detectable in upper and lower respiratory specimens during the acute phase of infection. Negative results do not preclude SARS-CoV-2 infection, do not rule out co-infections with other pathogens, and should not be used as the sole basis for treatment or other patient management decisions. Negative results must be combined with clinical observations, patient history, and epidemiological information. The expected result is Negative. Fact Sheet for  Patients: SugarRoll.be Fact Sheet for Healthcare Providers: https://www.woods-mathews.com/ This test is not yet approved or cleared by the [place] FDA and  has been authorized for detection and/or diagnosis of SARS-CoV-2 by FDA under an Emergency Use Authorization (EUA). This EUA will remain  in effect (meaning this test can be used) for the duration of the COVID-19 declaration under Section 56 4(b)(1) of the Act, 21 U.S.C. section 360bbb-3(b)(1), unless the authorization is terminated or revoked sooner. Performed at [facility], [place] [address] [place], [place] [address]   Culture, blood (routine x 2)     Status: None (Preliminary result)   Collection Time: 09/19/19  5:54 PM   Specimen: BLOOD  Result Value Ref Range Status   Specimen Description   Final    BLOOD RIGHT ANTECUBITAL Performed at [facility], [place] [address] [place], [place] [address]    Special Requests   Final    BOTTLES DRAWN AEROBIC AND ANAEROBIC Blood Culture adequate volume Performed at [facility] [address], [place], [place] [address]    Culture   Final    NO GROWTH 4 DAYS Performed at [facility], [place] [address], [place], [place] [address]    Report Status PENDING  Incomplete     Studies: MR FOOT LEFT WO CONTRAST  Result Date: 09/22/2019 CLINICAL DATA:  Chronic venous insufficiency with left lower leg and foot wounds. Evaluate for osteomyelitis. EXAM: MRI OF THE LEFT FOOT WITHOUT CONTRAST TECHNIQUE: Multiplanar, multisequence MR imaging of the left forefoot was performed. No intravenous contrast was administered. COMPARISON:  Left foot x-rays dated September 19, 2019. FINDINGS: Despite efforts by the technologist and patient, motion artifact is present on today's exam and could not be eliminated. This reduces exam sensitivity and specificity. Bones/Joint/Cartilage No marrow signal abnormality. No fracture or dislocation.  Mild hallux valgus deformity. Second through fifth hammertoe deformities. No joint effusion. Ligaments Collateral ligaments are intact. Muscles and Tendons Flexor, peroneal and extensor compartment tendons are intact. Increased T2 signal within the intrinsic muscles of the forefoot, nonspecific, but likely related to diabetic muscle changes. Soft tissue Severe dorsal foot soft tissue swelling. No fluid collection or hematoma. No soft tissue mass. IMPRESSION: 1. No evidence of osteomyelitis. 2. Severe dorsal foot soft tissue swelling as can be seen with chronic venous insufficiency and/or cellulitis. Electronically Signed   By: Titus Dubin M.D.   On: 09/22/2019 14:18    Scheduled Meds: . apixaban  5 mg Oral BID  . clopidogrel  75 mg Oral Daily  . feeding supplement (  ENSURE ENLIVE)  237 mL Oral BID BM  . furosemide  20 mg Intravenous Q12H  . insulin aspart  0-9 Units Subcutaneous TID WC  . polyethylene glycol  17 g Oral Daily  . potassium chloride  10 mEq Oral Daily  . senna-docusate  1 tablet Oral BID  . simvastatin  20 mg Oral Daily  . tamsulosin  0.4 mg Oral QHS  . vitamin B-12  100 mcg Oral Daily    Continuous Infusions: .  ceFAZolin (ANCEF) IV 2 g (09/23/19 0729)     Time spent: 30mns I have personally reviewed and interpreted on  09/23/2019 daily labs, tele strips, imagings as discussed above under date review session and assessment and plans.  I reviewed all nursing notes, pharmacy notes, consultant notes,  vitals, pertinent old records  I have discussed plan of care as described above with RN , patient  on 09/23/2019   FFlorencia ReasonsMD, PhD, FACP  [facility]  Available via Epic secure chat 7am-7pm for nonurgent issues Please page for urgent issues, pager number available through [email or web address] .   09/23/2019, 11:31 AM  LOS: 4 days

## 2019-09-23 NOTE — Progress Notes (Signed)
Wound care performed to bilateral lower extremities per order. Patient has no complaints of pain, will continue with care plan.

## 2019-09-24 DIAGNOSIS — I82402 Acute embolism and thrombosis of unspecified deep veins of left lower extremity: Secondary | ICD-10-CM

## 2019-09-24 LAB — CULTURE, BLOOD (ROUTINE X 2)
Culture: NO GROWTH
Culture: NO GROWTH
Special Requests: ADEQUATE
Special Requests: ADEQUATE

## 2019-09-24 LAB — GLUCOSE, CAPILLARY
Glucose-Capillary: 133 mg/dL — ABNORMAL HIGH (ref 70–99)
Glucose-Capillary: 134 mg/dL — ABNORMAL HIGH (ref 70–99)
Glucose-Capillary: 119 mg/dL — ABNORMAL HIGH (ref 70–99)
Glucose-Capillary: 131 mg/dL — ABNORMAL HIGH (ref 70–99)

## 2019-09-24 LAB — BASIC METABOLIC PANEL
Anion gap: 11 (ref 5–15)
BUN: 14 mg/dL (ref 8–23)
CO2: 29 mmol/L (ref 22–32)
Calcium: 9.7 mg/dL (ref 8.9–10.3)
Chloride: 96 mmol/L — ABNORMAL LOW (ref 98–111)
Creatinine, Ser: 1.13 mg/dL (ref 0.61–1.24)
GFR calc Af Amer: >60 mL/min (ref >60)
GFR calc non Af Amer: 58 mL/min — ABNORMAL LOW (ref >60)
Glucose, Bld: 137 mg/dL — ABNORMAL HIGH (ref 70–99)
Potassium: 3.9 mmol/L (ref 3.5–5.1)
Sodium: 136 mmol/L (ref 135–145)

## 2019-09-24 LAB — MAGNESIUM: Magnesium: 2.4 mg/dL (ref 1.7–2.4)

## 2019-09-24 MED ORDER — FUROSEMIDE 10 MG/ML IJ SOLN
20.0000 mg | Freq: Every day | INTRAMUSCULAR | Status: DC
  Administered 2019-09-25: 09:00:00 20 mg via INTRAVENOUS
  Filled 2019-09-24: qty 2

## 2019-09-24 NOTE — Progress Notes (Signed)
PROGRESS NOTE  Mark Bullock JZP:[id number] DOB: [birth year] DOA: 09/19/2019 PCP: Lorene Dy, MD Brief history:  Patient is a 86 year old male with history of PE, DVT, chronic systolic CHF, EF 35 to 48%, diabetes mellitus type 2, history of CVA with left-sided weakness, hypertension, BPH, B12 deficiency presented to ED after found to have increasing swelling of the both lower extremities extending up to the thigh and scrotum, increasing skin breakdown concerning for cellulitis.  Per report, patient has not been very cooperative with taking his medications. He is from [facility] independent living facility. Patient was admitted for acute on chronic CHF exacerbation, cellulitis of the lower extremities    Today, patient denies any new complaints, still feels weak overall.  Patient denies any chest pain, abdominal pain, nausea/vomiting, fever/chills.    Assessment/Plan: Principal Problem:   Cellulitis of both lower extremities Active Problems:   CKD (chronic kidney disease), stage III   Type II diabetes mellitus with stage 3 chronic kidney disease (HCC)   Vascular dementia without behavioral disturbance (HCC)   Chronic systolic CHF (congestive heart failure) (HCC)   Leg DVT (deep venous thromboembolism), acute, left (HCC)   Cellulitis   Palliative care encounter   Goals of care, counseling/discussion   Cellulitis of both lower extremities Currently afebrile, with no leukocytosis BC x2 NGTD Likely superimposed bacterial infection on chronic venous stasis and skin breakdown ABIs in the normal range, MRI of the left foot no osteomyelitis, does show" Severe dorsal foot soft tissue swelling as can be seen with chronic venous insufficiency and/or cellulitis." Continue IV cefazolin Continue IV Lasix Appreciate wound care recommendations, Unna boots applied on March 15, elevate legs  Acute on chronic combined systolic and diastolic CHF exacerbation Previous 2D echo 03/2019 had  shown EF of 35 to 40%, impaired relaxation LV diastolic filling, noncompliance with Lasix Continue Lasix, switched to daily Negative balance of 3.4 L.,  Holding lisinopril to allow diuresis Strict I's and O's, daily weights  Mild acute on CKD (chronic kidney disease), stage IIIa Improved Monitor BMP, renal dosing meds  Type II diabetes mellitus with stage 3 chronic kidney disease A1c 5.3 Diet controlled SSI, Accu-Cheks, hypoglycemic protocol  Constipation Received enema on 3/13, continue MiraLAX daily, Senokot  BPH Continue Flomax  History of DVT (deep venous thromboembolism), PE Continue apixaban  Hyperlipidemia Continue statin  Anemia of chronic disease likely due to CKDIII H&H currently at baseline  Dementia  Currently no acute agitation or delirium, mental status stable Appreciate palliative medicine goals of care meeting and recommendations  Failure to thrive/generalized weakness From [facility] assisted living PT recommended SNF  Code Status: Full CODE STATUS DVT Prophylaxis: Apixaban   Family Communication: Discussed extensively with patient  Disposition Plan:    Patient came from: ALF                                                                                         Anticipated d/c place: SNF  Barriers to d/c OR conditions which need to be met to effect a safe d/c:  Remain on IV Lasix and IV antibiotics, likely able to transition to oral in the next 24H, remains  very weak. Plan to d/c to SNF on 09/25/19   Consultants:  Palliative care  Procedures:  None  Antibiotics:  IV Ancef   Objective: BP 105/72 (BP Location: Left Arm)   Pulse 72   Temp 98.3 F (36.8 C)   Resp 16   Wt 77.1 kg   SpO2 95%   BMI 25.84 kg/m   Intake/Output Summary (Last 24 hours) at 09/24/2019 1729 Last data filed at 09/24/2019 1332 Gross per 24 hour  Intake 1044 ml  Output 1495 ml  Net -451 ml   Filed Weights   09/21/19 0532 09/22/19 0537 09/23/19  0512  Weight: 85.1 kg 84.2 kg 77.1 kg    Exam:  General: NAD   Cardiovascular: S1, S2 present  Respiratory: CTAB  Abdomen: Soft, nontender, nondistended, bowel sounds present  Musculoskeletal: + bilateral pedal edema noted, Unna boots noted  Skin: Normal  Psychiatry: Normal mood   Data Reviewed: Basic Metabolic Panel: Recent Labs  Lab 09/20/19 0432 09/21/19 0557 09/22/19 0536 09/23/19 0638 09/24/19 0527  NA 137 138 135 139 136  K 3.8 3.5 3.7 3.9 3.9  CL 104 103 100 99 96*  CO2 '26 26 27 30 29  ' GLUCOSE 136* 71 94 104* 137*  BUN '17 14 16 12 14  ' CREATININE 1.01 1.07 0.98 1.16 1.13  CALCIUM 8.4* 8.4* 8.4* 8.9 9.7  MG  --   --   --   --  2.4   Liver Function Tests: Recent Labs  Lab 09/19/19 1749  AST 14*  ALT 28  ALKPHOS 52  BILITOT 2.3*  PROT 6.6  ALBUMIN 3.2*   No results for input(s): LIPASE, AMYLASE in the last 168 hours. No results for input(s): AMMONIA in the last 168 hours. CBC: Recent Labs  Lab 09/19/19 1749 09/20/19 0432 09/21/19 0557 09/22/19 0536 09/23/19 0638  WBC 8.4 6.9 5.9 7.5 6.7  NEUTROABS 7.0  --   --   --   --   HGB 11.5* 10.4* 10.5* 11.0* 12.3*  HCT 36.7* 33.0* 33.6* 35.0* 39.2  MCV 90.0 89.4 88.0 88.2 90.1  PLT 220 194 191 206 202   Cardiac Enzymes:   No results for input(s): CKTOTAL, CKMB, CKMBINDEX, TROPONINI in the last 168 hours. BNP (last 3 results) Recent Labs    03/28/19 2249 09/19/19 1749  BNP 88.0 69.0    ProBNP (last 3 results) No results for input(s): PROBNP in the last 8760 hours.  CBG: Recent Labs  Lab 09/23/19 1724 09/23/19 2048 09/24/19 0736 09/24/19 1138 09/24/19 1640  GLUCAP 105* 110* 133* 134* 119*    Recent Results (from the past 240 hour(s))  Culture, blood (routine x 2)     Status: None   Collection Time: 09/19/19  5:49 PM   Specimen: BLOOD  Result Value Ref Range Status   Specimen Description   Final    BLOOD LEFT ASSIST CONTROL Performed at [facility], [place]  [address], [place], [place] [address]    Special Requests   Final    BOTTLES DRAWN AEROBIC AND ANAEROBIC Blood Culture adequate volume Performed at [facility] [address], [place], [place] [address]    Culture   Final    NO GROWTH 5 DAYS Performed at [facility], [place] [address], [place],  [address]    Report Status 09/24/2019 FINAL  Final  SARS CORONAVIRUS 2 (TAT 6-24 HRS) Nasopharyngeal Nasopharyngeal Swab     Status: None   Collection Time: 09/19/19  5:50 PM   Specimen: Nasopharyngeal Swab  Result Value Ref Range Status   SARS Coronavirus 2 NEGATIVE NEGATIVE Final    Comment: (NOTE) SARS-CoV-2 target nucleic acids are NOT DETECTED. The SARS-CoV-2 RNA is generally detectable in upper and lower respiratory specimens during the acute phase of infection. Negative results do not preclude SARS-CoV-2 infection, do not rule out co-infections with other pathogens, and should not be used as the sole basis for treatment or other patient management decisions. Negative results must be combined with clinical observations, patient history, and epidemiological information. The expected result is Negative. Fact Sheet for Patients: SugarRoll.be Fact Sheet for Healthcare Providers: https://www.woods-mathews.com/ This test is not yet approved or cleared by the [place] FDA and  has been authorized for detection and/or diagnosis of SARS-CoV-2 by FDA under an Emergency Use Authorization (EUA). This EUA will remain  in effect (meaning this test can be used) for the duration of the COVID-19 declaration under Section 56 4(b)(1) of the Act, 21 U.S.C. section 360bbb-3(b)(1), unless the authorization is terminated or revoked sooner. Performed at [facility], [place] [address], [place], [place] [address]   Culture, blood (routine x 2)     Status: None   Collection Time: 09/19/19  5:54 PM   Specimen: BLOOD  Result  Value Ref Range Status   Specimen Description   Final    BLOOD RIGHT ANTECUBITAL Performed at [facility], [place] [address], [place], [place] [address]    Special Requests   Final    BOTTLES DRAWN AEROBIC AND ANAEROBIC Blood Culture adequate volume Performed at [facility] [address], [place], [place] [address]    Culture   Final    NO GROWTH 5 DAYS Performed at [facility], [place] [address], [place], [place] [address]    Report Status 09/24/2019 FINAL  Final     Studies: No results found.  Scheduled Meds: . apixaban  5 mg Oral BID  . clopidogrel  75 mg Oral Daily  . feeding supplement (ENSURE ENLIVE)  237 mL Oral BID BM  . furosemide  20 mg Intravenous Q12H  . insulin aspart  0-9 Units Subcutaneous TID WC  . polyethylene glycol  17 g Oral Daily  . potassium chloride  10 mEq Oral Daily  . senna-docusate  1 tablet Oral BID  . simvastatin  20 mg Oral Daily  . tamsulosin  0.4 mg Oral QHS  . vitamin B-12  100 mcg Oral Daily    Continuous Infusions: .  ceFAZolin (ANCEF) IV 2 g (09/24/19 1506)       Alma Friendly MD  [facility]    09/24/2019, 5:29 PM  LOS: 5 days

## 2019-09-24 NOTE — Progress Notes (Signed)
Py very agitated and aggressive this evening during evening med pass.  Refused most meds, then physically grabbed and scratched staff when they tried to get medication packet back to return it to the [facility].  Pt also refused nausea medicine when offered after having some emesis.  House supervisor made aware and Pt told to call if he changed his mind and wanted any other medications or assistance.

## 2019-09-24 NOTE — Progress Notes (Signed)
OT Cancellation Note  Patient Details Name: Mark Bullock MRN: [id number] DOB: 08-07-1928   Cancelled Treatment:    Reason Eval/Treat Not Completed: Patient declined, no reason specified. Pt evaluated yesterday, with plans for OT to see later this week. Pt is on OT caseload and we will follow acutely. No big changes in medical status/cognition etc. Evaluation called for SNF placement post-acute.   [name] 09/24/2019, 12:31 PM   [name] OTR/L Acute Rehabilitation Services Pager: [phone] Office: [phone]

## 2019-09-25 LAB — BASIC METABOLIC PANEL
Anion gap: 7 (ref 5–15)
BUN: 16 mg/dL (ref 8–23)
CO2: 29 mmol/L (ref 22–32)
Calcium: 8.8 mg/dL — ABNORMAL LOW (ref 8.9–10.3)
Chloride: 101 mmol/L (ref 98–111)
Creatinine, Ser: 0.99 mg/dL (ref 0.61–1.24)
GFR calc Af Amer: >60 mL/min (ref >60)
GFR calc non Af Amer: >60 mL/min (ref >60)
Glucose, Bld: 99 mg/dL (ref 70–99)
Potassium: 3.9 mmol/L (ref 3.5–5.1)
Sodium: 137 mmol/L (ref 135–145)

## 2019-09-25 LAB — GLUCOSE, CAPILLARY
Glucose-Capillary: 93 mg/dL (ref 70–99)
Glucose-Capillary: 114 mg/dL — ABNORMAL HIGH (ref 70–99)
Glucose-Capillary: 118 mg/dL — ABNORMAL HIGH (ref 70–99)

## 2019-09-25 LAB — CBC WITH DIFFERENTIAL/PLATELET
Abs Immature Granulocytes: 0.02 10*3/uL (ref 0.00–0.07)
Basophils Absolute: 0 10*3/uL (ref 0.0–0.1)
Basophils Relative: 0 %
Eosinophils Absolute: 0 10*3/uL (ref 0.0–0.5)
Eosinophils Relative: 0 %
HCT: 37.1 % — ABNORMAL LOW (ref 39.0–52.0)
Hemoglobin: 11.7 g/dL — ABNORMAL LOW (ref 13.0–17.0)
Immature Granulocytes: 0 %
Lymphocytes Relative: 12 %
Lymphs Abs: 1 10*3/uL (ref 0.7–4.0)
MCH: 27.6 pg (ref 26.0–34.0)
MCHC: 31.5 g/dL (ref 30.0–36.0)
MCV: 87.5 fL (ref 80.0–100.0)
Monocytes Absolute: 0.8 10*3/uL (ref 0.1–1.0)
Monocytes Relative: 10 %
Neutro Abs: 6 10*3/uL (ref 1.7–7.7)
Neutrophils Relative %: 78 %
Platelets: 253 10*3/uL (ref 150–400)
RBC: 4.24 MIL/uL (ref 4.22–5.81)
RDW: 16.7 % — ABNORMAL HIGH (ref 11.5–15.5)
WBC: 7.8 10*3/uL (ref 4.0–10.5)
nRBC: 0 % (ref 0.0–0.2)

## 2019-09-25 LAB — SARS CORONAVIRUS 2 (TAT 6-24 HRS): SARS Coronavirus 2: NEGATIVE

## 2019-09-25 MED ORDER — TAMSULOSIN HCL 0.4 MG PO CAPS: 0.4000 mg | ORAL_CAPSULE | Freq: Every day | ORAL | Status: AC

## 2019-09-25 MED ORDER — CEPHALEXIN 500 MG PO CAPS: 500.0000 mg | ORAL_CAPSULE | Freq: Four times a day (QID) | ORAL | 0 refills | Status: AC

## 2019-09-25 NOTE — TOC Progression Note (Signed)
Transition of Care [facility]) - Progression Note    Patient Details  Name: Mark Bullock MRN: [id number] Date of Birth: 02-03-1929  [facility]) CM/SW Contact  Ida Rogue, [place] Phone Number: 09/25/2019, 7:53 AM  Clinical Narrative:   Left a message for daughter yesterday afternoon and again this AM to clarify choice of bed.  If I do not hear back, the default plan, as I outlined in my message, is to start with the highest rated facility and work down from there in terms of transfer. TOC will continue to follow during the course of hospitalization.     Expected Discharge Plan: Home w Home Health Services Barriers to Discharge: No Barriers Identified  Expected Discharge Plan and Services Expected Discharge Plan: Home w Home Health Services   Discharge Planning Services: CM Consult   Living arrangements for the past 2 months: Independent Living Facility                                       Social Determinants of Health (SDOH) Interventions    Readmission Risk Interventions No flowsheet data found.

## 2019-09-25 NOTE — TOC Transition Note (Signed)
Transition of Care [facility]) - CM/SW Discharge Note   Patient Details  Name: Mark Bullock MRN: [id number] Date of Birth: 1928-08-30  Transition of Care [facility]) CM/SW Contact:  Ida Rogue, LCSW Phone Number: 09/25/2019, 3:13 PM   Clinical Narrative:   Patient to transfer to [facility] for rehab today.  PTAR set up.  Nursing, please call report to [phone], room 1202P. TOC sign off.      Barriers to Discharge: Barriers Resolved   Patient Goals and CMS Choice        Discharge Placement                       Discharge Plan and Services   Discharge Planning Services: CM Consult                                 Social Determinants of Health (SDOH) Interventions     Readmission Risk Interventions No flowsheet data found.

## 2019-09-25 NOTE — Discharge Summary (Signed)
Discharge Summary  Mark Bullock PJK:[id number] DOB: Aug 18, 1928  PCP: Burton Apley, MD  Admit date: 09/19/2019 Discharge date: 09/25/2019  Time spent: 40 mins  Recommendations for Outpatient Follow-up:  1. PCP in 1 week  Discharge Diagnoses:  Active Hospital Problems   Diagnosis Date Noted  . Cellulitis of both lower extremities 09/19/2019  . Goals of care, counseling/discussion   . Palliative care encounter   . Cellulitis 09/19/2019  . Chronic systolic CHF (congestive heart failure) (HCC) 03/30/2019  . Leg DVT (deep venous thromboembolism), acute, left (HCC) 03/30/2019  . Type II diabetes mellitus with stage 3 chronic kidney disease (HCC) 05/24/2017  . Vascular dementia without behavioral disturbance (HCC) 05/24/2017  . CKD (chronic kidney disease), stage III 05/18/2017    Resolved Hospital Problems  No resolved problems to display.    Discharge Condition: Stable  Diet recommendation: Heart healthy, moderate carb  Vitals:   09/25/19 0615 09/25/19 1344  BP: 101/68 116/74  Pulse: 65 73  Resp: 16 16  Temp: 97.9 F (36.6 C) 98.5 F (36.9 C)  SpO2: 94% 97%    History of present illness:  Patient is a 86 year old male with history of PE, DVT, chronic systolic CHF, EF 35 to 40%, diabetes mellitus type 2, history of CVA with left-sided weakness, hypertension, BPH, B12 deficiency presented to ED after found to have increasing swelling of the both lower extremities extending up to the thigh and scrotum, increasing skin breakdown concerning for cellulitis. Per report, patient has not been very cooperative with taking his medications. He is from [facility] facility. Patient was admitted for acute on chronic CHF exacerbation, cellulitis of the lower extremities.    Today, patient denies any new complaints.  DC to SNF for rehab needs.  Patient denies any chest pain, abdominal pain, nausea/vomiting, fever/chills.    Hospital Course:  Principal  Problem:   Cellulitis of both lower extremities Active Problems:   CKD (chronic kidney disease), stage III   Type II diabetes mellitus with stage 3 chronic kidney disease (HCC)   Vascular dementia without behavioral disturbance (HCC)   Chronic systolic CHF (congestive heart failure) (HCC)   Leg DVT (deep venous thromboembolism), acute, left (HCC)   Cellulitis   Palliative care encounter   Goals of care, counseling/discussion   Cellulitis of both lower extremities Currently afebrile, with no leukocytosis BC x2 NGTD Likely superimposed bacterial infection on chronic venous stasis and skin breakdown ABIs in the normal range, MRI of the left foot no osteomyelitis, does show" Severe dorsal foot soft tissue swelling as can be seen with chronic venous insufficiency and/or cellulitis." S/P IV cefazolin, switch to PO Keflex for another 3 days, ending on 09/28/19 for a total of 7 days Continue PO Lasix Wound care  Acute on chronic combined systolic and diastolic CHF exacerbation Previous 2D echo 03/2019 had shown EF of 35 to 40%, impaired relaxation LV diastolic filling, noncompliance with Lasix Continue Lasix, switched to daily, continue lisinopril Negative balance of 3.7 L  Mild acute on CKD (chronic kidney disease), stage IIIa Improved Monitor BMP, renal dosing meds  Type II diabetes mellitus with stage 3 chronic kidney disease A1c 5.3 Diet controlled SSI, Accu-Cheks, hypoglycemic protocol  Constipation Received enema on 3/13, continue MiraLAX daily, Senokot  BPH Continue Flomax  History of DVT (deep venous thromboembolism), PE Continueapixaban  Anemia of chronic disease likely due to CKDIII H&Hcurrently at baseline  Dementia  Currently no acute agitation or delirium, mental status stable Appreciate palliative medicine goals of  care meeting and recommendations  Failure to thrive/generalized weakness PT recommended SNF       Malnutrition Type:       Malnutrition Characteristics:      Nutrition Interventions:      Estimated body mass index is 25.88 kg/m as calculated from the following:   Height as of this encounter: 5\' 8"  (1.727 m).   Weight as of this encounter: 77.2 kg.    Procedures:   None  Consultations:  Palliative care   Discharge Exam: BP 116/74   Pulse 73   Temp 98.5 F (36.9 C)   Resp 16   Ht 5\' 8"  (1.727 m)   Wt 77.2 kg   SpO2 97%   BMI 25.88 kg/m   General: NAD Cardiovascular: S1, S2 present Respiratory: CTA B  Discharge Instructions You were cared for by a hospitalist during your hospital stay. If you have any questions about your discharge medications or the care you received while you were in the hospital after you are discharged, you can call the unit and asked to speak with the hospitalist on call if the hospitalist that took care of you is not available. Once you are discharged, your primary care physician will handle any further medical issues. Please note that NO REFILLS for any discharge medications will be authorized once you are discharged, as it is imperative that you return to your primary care physician (or establish a relationship with a primary care physician if you do not have one) for your aftercare needs so that they can reassess your need for medications and monitor your lab values.  Discharge Instructions    Diet - low sodium heart healthy   Complete by: As directed    Increase activity slowly   Complete by: As directed      Allergies as of 09/25/2019   No Known Allergies     Medication List    STOP taking these medications   doxycycline 100 MG tablet Commonly known as: VIBRA-TABS   Rivaroxaban 15 & 20 MG Tbpk   traMADol 50 MG tablet Commonly known as: ULTRAM     TAKE these medications   cephALEXin 500 MG capsule Commonly known as: Keflex Take 1 capsule (500 mg total) by mouth 4 (four) times daily for 3 days. Start taking on: September 26, 2019   clopidogrel  75 MG tablet Commonly known as: PLAVIX Take 75 mg by mouth daily.   Eliquis 5 MG Tabs tablet Generic drug: apixaban Take 5 mg by mouth 2 (two) times daily.   feeding supplement (ENSURE ENLIVE) Liqd Take 237 mLs by mouth 2 (two) times daily between meals.   furosemide 40 MG tablet Commonly known as: LASIX Take 0.5 tablets (20 mg total) by mouth daily.   hydrocerin Crea Apply 1 application topically 2 (two) times daily. Bilateral lower legs   lisinopril 5 MG tablet Commonly known as: ZESTRIL Take 0.5 tablets (2.5 mg total) by mouth daily.   polyethylene glycol 17 g packet Commonly known as: MIRALAX / GLYCOLAX Take 17 g by mouth daily.   senna 8.6 MG tablet Commonly known as: SENOKOT Take 1 tablet by mouth 2 (two) times daily.   tamsulosin 0.4 MG Caps capsule Commonly known as: FLOMAX Take 1 capsule (0.4 mg total) by mouth at bedtime.      No Known Allergies  Contact information for follow-up providers    [name], [name], MD. Schedule an appointment as soon as possible for a visit in 1 week(s).   Specialty:  Internal Medicine Contact information: [address] [place] [address] [phone]            Contact information for after-discharge care    Destination    [name] .   Service: Skilled Nursing Contact information: [address] [place] [address] [phone]                   The results of significant diagnostics from this hospitalization (including imaging, microbiology, ancillary and laboratory) are listed below for reference.    Significant Diagnostic Studies: MR FOOT LEFT WO CONTRAST  Result Date: 09/22/2019 CLINICAL DATA:  Chronic venous insufficiency with left lower leg and foot wounds. Evaluate for osteomyelitis. EXAM: MRI OF THE LEFT FOOT WITHOUT CONTRAST TECHNIQUE: Multiplanar, multisequence MR imaging of the left forefoot was performed. No intravenous contrast was administered.  COMPARISON:  Left foot x-rays dated September 19, 2019. FINDINGS: Despite efforts by the technologist and patient, motion artifact is present on today's exam and could not be eliminated. This reduces exam sensitivity and specificity. Bones/Joint/Cartilage No marrow signal abnormality. No fracture or dislocation. Mild hallux valgus deformity. Second through fifth hammertoe deformities. No joint effusion. Ligaments Collateral ligaments are intact. Muscles and Tendons Flexor, peroneal and extensor compartment tendons are intact. Increased T2 signal within the intrinsic muscles of the forefoot, nonspecific, but likely related to diabetic muscle changes. Soft tissue Severe dorsal foot soft tissue swelling. No fluid collection or hematoma. No soft tissue mass. IMPRESSION: 1. No evidence of osteomyelitis. 2. Severe dorsal foot soft tissue swelling as can be seen with chronic venous insufficiency and/or cellulitis. Electronically Signed   By: Obie Dredge M.D.   On: 09/22/2019 14:18   DG Chest Port 1 View  Result Date: 09/19/2019 CLINICAL DATA:  Dyspnea. Leg swelling. EXAM: PORTABLE CHEST 1 VIEW COMPARISON:  Radiograph 03/28/2019. CT 03/30/2019 FINDINGS: Chronic elevation of right hemidiaphragm. Possible small right pleural effusion. Unchanged heart size and mediastinal contours. No pulmonary edema, focal airspace disease, or pneumothorax. Chronic change of both shoulders. No acute osseous abnormalities are seen. IMPRESSION: Chronic elevation of right hemidiaphragm with small right pleural effusion. Electronically Signed   By: Narda Rutherford M.D.   On: 09/19/2019 18:35   DG Foot 2 Views Left  Result Date: 09/19/2019 CLINICAL DATA:  Dyspnea. Patient has bilateral foot necrosis per technologist note. EXAM: LEFT FOOT - 2 VIEW COMPARISON:  None. FINDINGS: AP views are limited by positioning, patient had difficulty tolerating the exam. Hammertoe deformity of the digits limits assessment. No obvious bony destruction or  periosteal reaction. Small plantar calcaneal spur and Achilles tendon enthesophyte. Soft tissue edema is most prominent about the dorsum of the foot. No soft tissue air or radiopaque foreign body. IMPRESSION: Soft tissue edema, most prominent about the dorsum of the foot. No soft tissue air. No radiographic evidence of osteomyelitis, however exam is limited by positioning. Electronically Signed   By: Narda Rutherford M.D.   On: 09/19/2019 18:37   VAS [place] ABI WITH/WO TBI  Result Date: 09/20/2019 LOWER EXTREMITY DOPPLER STUDY Indications: Rest pain. High Risk Factors: Hypertension, hyperlipidemia, Diabetes.  Comparison Study: no prior Performing Technologist: Blanch Media RVS  Examination Guidelines: A complete evaluation includes at minimum, Doppler waveform signals and systolic blood pressure reading at the level of bilateral brachial, anterior tibial, and posterior tibial arteries, when vessel segments are accessible. Bilateral testing is considered an integral part of a complete examination. Photoelectric Plethysmograph (PPG) waveforms and toe systolic pressure readings are included  as required and additional duplex testing as needed. Limited examinations for reoccurring indications may be performed as noted.  ABI Findings: +---------+------------------+-----+---------+-------------------------+ Right    Rt Pressure (mmHg)IndexWaveform Comment                   +---------+------------------+-----+---------+-------------------------+ Brachial 95                     triphasic                          +---------+------------------+-----+---------+-------------------------+ PTA      118               1.05 triphasic                          +---------+------------------+-----+---------+-------------------------+ DP       102               0.91 triphasic                          +---------+------------------+-----+---------+-------------------------+ Great Toe                                 unable to put toe cuff on +---------+------------------+-----+---------+-------------------------+ +---------+------------------+-----+---------+-------------------------+ Left     Lt Pressure (mmHg)IndexWaveform Comment                   +---------+------------------+-----+---------+-------------------------+ Brachial 112                    triphasic                          +---------+------------------+-----+---------+-------------------------+ PTA      111               0.99 triphasic                          +---------+------------------+-----+---------+-------------------------+ DP       94                0.84 triphasic                          +---------+------------------+-----+---------+-------------------------+ Great Toe                                unable to put toe cuff on +---------+------------------+-----+---------+-------------------------+ +-------+-----------+-----------+------------+------------+ ABI/TBIToday's ABIToday's TBIPrevious ABIPrevious TBI +-------+-----------+-----------+------------+------------+ Right  1.05                                           +-------+-----------+-----------+------------+------------+ Left   0.99                                           +-------+-----------+-----------+------------+------------+  Summary: Right: Resting right ankle-brachial index is within normal range. No evidence of significant right lower extremity arterial disease. Left: Resting left ankle-brachial index is within normal range. No evidence of significant left lower extremity arterial disease.  *See table(s) above for measurements and  observations.  Electronically signed by Harold Barban MD on 09/20/2019 at 7:05:05 PM.    Final    ECHOCARDIOGRAM COMPLETE  Result Date: 09/20/2019    ECHOCARDIOGRAM REPORT   Patient Name:   QUANG THORPE Colee Date of Exam: 09/20/2019 Medical Rec #:  [id number]           Height:       68.0 in Accession #:     [id number]          Weight:       185.6 lb Date of Birth:  August 31, 1928           BSA:          1.980 m Patient Age:    61 years            BP:           92/57 mmHg Patient Gender: M                   HR:           71 bpm. Exam Location:  Inpatient Procedure: 2D Echo, Color Doppler and Cardiac Doppler Indications:    I50.9* Heart failure (unspecified)  History:        Patient has prior history of Echocardiogram examinations, most                 recent 03/30/2019. CHF; Risk Factors:Hypertension, Diabetes and                 Dyslipidemia.  Sonographer:    Raquel Sarna Senior RDCS Referring Phys: [id number] RIPUDEEP K RAI IMPRESSIONS  1. Left ventricular ejection fraction, by estimation, is 35 to 40%. The left ventricle has moderately decreased function. The left ventricle demonstrates global hypokinesis. Left ventricular diastolic parameters are consistent with Grade I diastolic dysfunction (impaired relaxation).  2. Right ventricular systolic function is moderately reduced. The right ventricular size is moderately enlarged. There is normal pulmonary artery systolic pressure. The estimated right ventricular systolic pressure is 93.8 mmHg.  3. The mitral valve is normal in structure. No evidence of mitral valve regurgitation. No evidence of mitral stenosis.  4. The aortic valve is abnormal. Aortic valve regurgitation is not visualized. No aortic stenosis is present.  5. The inferior vena cava is normal in size with <50% respiratory variability, suggesting right atrial pressure of 8 mmHg. Comparison(s): A prior study was performed on 03/30/2019. No significant change from prior study. Prior images reviewed side by side. FINDINGS  Left Ventricle: Left ventricular ejection fraction, by estimation, is 35 to 40%. The left ventricle has moderately decreased function. The left ventricle demonstrates global hypokinesis. The left ventricular internal cavity size was normal in size. There is no left ventricular hypertrophy. Left ventricular  diastolic parameters are consistent with Grade I diastolic dysfunction (impaired relaxation). Right Ventricle: The right ventricular size is moderately enlarged. No increase in right ventricular wall thickness. Right ventricular systolic function is moderately reduced. There is normal pulmonary artery systolic pressure. The tricuspid regurgitant velocity is 2.31 m/s, and with an assumed right atrial pressure of 8 mmHg, the estimated right ventricular systolic pressure is 10.1 mmHg. Left Atrium: Left atrial size was normal in size. Right Atrium: Right atrial size was normal in size. Pericardium: There is no evidence of pericardial effusion. Mitral Valve: The mitral valve is normal in structure. Normal mobility of the mitral valve leaflets. No evidence of mitral valve regurgitation. No evidence of mitral valve stenosis. Tricuspid Valve: The tricuspid valve is normal  in structure. Tricuspid valve regurgitation is trivial. No evidence of tricuspid stenosis. Aortic Valve: The aortic valve is abnormal. . There is mild thickening and mild calcification of the aortic valve. Aortic valve regurgitation is not visualized. No aortic stenosis is present. Mild aortic valve annular calcification. There is mild thickening of the aortic valve. There is mild calcification of the aortic valve. Pulmonic Valve: The pulmonic valve was normal in structure. Pulmonic valve regurgitation is trivial. No evidence of pulmonic stenosis. Aorta: The aortic root is normal in size and structure. Venous: The inferior vena cava is normal in size with less than 50% respiratory variability, suggesting right atrial pressure of 8 mmHg. IAS/Shunts: No atrial level shunt detected by color flow Doppler.  LEFT VENTRICLE PLAX 2D LVIDd:         4.40 cm     Diastology LVIDs:         2.70 cm     LV e' medial: 8.27 cm/s LV PW:         1.00 cm LV IVS:        0.90 cm LVOT diam:     2.10 cm LV SV:         53 LV SV Index:   27 LVOT Area:     3.46 cm  LV Volumes (MOD)  LV vol d, MOD A2C: 85.2 ml LV vol d, MOD A4C: 97.8 ml LV vol s, MOD A2C: 42.9 ml LV vol s, MOD A4C: 52.7 ml LV SV MOD A2C:     42.3 ml LV SV MOD A4C:     97.8 ml LV SV MOD BP:      45.4 ml RIGHT VENTRICLE RV S prime:     13.20 cm/s TAPSE (M-mode): 1.8 cm LEFT ATRIUM             Index       RIGHT ATRIUM           Index LA diam:        2.90 cm 1.46 cm/m  RA Area:     19.30 cm LA Vol (A2C):   56.0 ml 28.28 ml/m RA Volume:   56.10 ml  28.33 ml/m LA Vol (A4C):   25.4 ml 12.83 ml/m LA Biplane Vol: 42.1 ml 21.26 ml/m  AORTIC VALVE LVOT Vmax:   88.10 cm/s LVOT Vmean:  62.700 cm/s LVOT VTI:    0.152 m  AORTA Ao Root diam: 3.40 cm TRICUSPID VALVE TR Peak grad:   21.3 mmHg TR Vmax:        231.00 cm/s  SHUNTS Systemic VTI:  0.15 m Systemic Diam: 2.10 cm Weston Brass MD Electronically signed by Weston Brass MD Signature Date/Time: 09/20/2019/9:46:43 PM    Final     Microbiology: Recent Results (from the past 240 hour(s))  Culture, blood (routine x 2)     Status: None   Collection Time: 09/19/19  5:49 PM   Specimen: BLOOD  Result Value Ref Range Status   Specimen Description   Final    BLOOD LEFT ASSIST CONTROL Performed at [facility], [address], [place], [place] [address]    Special Requests   Final    BOTTLES DRAWN AEROBIC AND ANAEROBIC Blood Culture adequate volume Performed at [facility], [address] [place], [place] [address]    Culture   Final    NO GROWTH 5 DAYS Performed at [facility], [address], [place], [place] [address]    Report Status  09/24/2019 FINAL  Final  SARS CORONAVIRUS 2 (TAT 6-24 HRS) Nasopharyngeal Nasopharyngeal Swab     Status: None   Collection Time: 09/19/19  5:50 PM   Specimen: Nasopharyngeal Swab  Result Value Ref Range Status   SARS Coronavirus 2 NEGATIVE NEGATIVE Final    Comment: (NOTE) SARS-CoV-2 target nucleic acids are NOT DETECTED. The SARS-CoV-2 RNA is generally detectable in upper and  lower respiratory specimens during the acute phase of infection. Negative results do not preclude SARS-CoV-2 infection, do not rule out co-infections with other pathogens, and should not be used as the sole basis for treatment or other patient management decisions. Negative results must be combined with clinical observations, patient history, and epidemiological information. The expected result is Negative. Fact Sheet for Patients: HairSlick.no Fact Sheet for Healthcare Providers: quierodirigir.com This test is not yet approved or cleared by the [place] FDA and  has been authorized for detection and/or diagnosis of SARS-CoV-2 by FDA under an Emergency Use Authorization (EUA). This EUA will remain  in effect (meaning this test can be used) for the duration of the COVID-19 declaration under Section 56 4(b)(1) of the Act, 21 U.S.C. section 360bbb-3(b)(1), unless the authorization is terminated or revoked sooner. Performed at [facility], [address], [place], [place] [address]   Culture, blood (routine x 2)     Status: None   Collection Time: 09/19/19  5:54 PM   Specimen: BLOOD  Result Value Ref Range Status   Specimen Description   Final    BLOOD RIGHT ANTECUBITAL Performed at [name], [address], [place], [place] [address]    Special Requests   Final    BOTTLES DRAWN AEROBIC AND ANAEROBIC Blood Culture adequate volume Performed at [facility], [address] [place], [place] [address]    Culture   Final    NO GROWTH 5 DAYS Performed at [facility], [address], [place], [place] [address]    Report Status 09/24/2019 FINAL  Final  SARS CORONAVIRUS 2 (TAT 6-24 HRS) Nasopharyngeal Nasopharyngeal Swab     Status: None   Collection Time: 09/25/19  9:23 AM   Specimen: Nasopharyngeal Swab  Result Value Ref Range Status   SARS Coronavirus 2 NEGATIVE NEGATIVE Final     Comment: (NOTE) SARS-CoV-2 target nucleic acids are NOT DETECTED. The SARS-CoV-2 RNA is generally detectable in upper and lower respiratory specimens during the acute phase of infection. Negative results do not preclude SARS-CoV-2 infection, do not rule out co-infections with other pathogens, and should not be used as the sole basis for treatment or other patient management decisions. Negative results must be combined with clinical observations, patient history, and epidemiological information. The expected result is Negative. Fact Sheet for Patients: HairSlick.no Fact Sheet for Healthcare Providers: quierodirigir.com This test is not yet approved or cleared by the [place] FDA and  has been authorized for detection and/or diagnosis of SARS-CoV-2 by FDA under an Emergency Use Authorization (EUA). This EUA will remain  in effect (meaning this test can be used) for the duration of the COVID-19 declaration under Section 56 4(b)(1) of the Act, 21 U.S.C. section 360bbb-3(b)(1), unless the authorization is terminated or revoked sooner. Performed at [facility], [address], [place], [place] [address]      Labs: Basic Metabolic Panel: Recent Labs  Lab 09/21/19 0557 09/22/19 0536 09/23/19 0638 09/24/19 0527 09/25/19 0551  NA 138 135 139 136 137  K 3.5 3.7  3.9 3.9 3.9  CL 103 100 99 96* 101  CO2 26 27 30 29 29   GLUCOSE 71 94 104* 137* 99  BUN 14 16 12 14 16   CREATININE 1.07 0.98 1.16 1.13 0.99  CALCIUM 8.4* 8.4* 8.9 9.7 8.8*  MG  --   --   --  2.4  --    Liver Function Tests: Recent Labs  Lab 09/19/19 1749  AST 14*  ALT 28  ALKPHOS 52  BILITOT 2.3*  PROT 6.6  ALBUMIN 3.2*   No results for input(s): LIPASE, AMYLASE in the last 168 hours. No results for input(s): AMMONIA in the last 168 hours. CBC: Recent Labs  Lab 09/19/19 1749 09/19/19 1749 09/20/19 0432 09/21/19 0557 09/22/19 0536  09/23/19 0638 09/25/19 0551  WBC 8.4   < > 6.9 5.9 7.5 6.7 7.8  NEUTROABS 7.0  --   --   --   --   --  6.0  HGB 11.5*   < > 10.4* 10.5* 11.0* 12.3* 11.7*  HCT 36.7*   < > 33.0* 33.6* 35.0* 39.2 37.1*  MCV 90.0   < > 89.4 88.0 88.2 90.1 87.5  PLT 220   < > 194 191 206 202 253   < > = values in this interval not displayed.   Cardiac Enzymes: No results for input(s): CKTOTAL, CKMB, CKMBINDEX, TROPONINI in the last 168 hours. BNP: BNP (last 3 results) Recent Labs    03/28/19 2249 09/19/19 1749  BNP 88.0 69.0    ProBNP (last 3 results) No results for input(s): PROBNP in the last 8760 hours.  CBG: Recent Labs  Lab 09/24/19 1138 09/24/19 1640 09/24/19 2013 09/25/19 0730 09/25/19 1223  GLUCAP 134* 119* 131* 93 114*       Signed:  09/27/19, MD [facility] 09/25/2019, 2:51 PM

## 2019-09-25 NOTE — Progress Notes (Signed)
PT Cancellation Note  Patient Details Name: PHENG PROKOP MRN: [id number] DOB: 28-Mar-1929   Cancelled Treatment:    Reason Eval/Treat Not Completed: Other (comment)(Transport already called to transition to a SNF for rehab) Harriett Rush, PT # [phone] CGV cell  Ephriam Jenkins 09/25/2019, 3:16 PM

## 2019-10-30 ENCOUNTER — Ambulatory Visit: Payer: Medicare Other | Attending: Family

## 2019-10-30 ENCOUNTER — Ambulatory Visit: Payer: Self-pay | Attending: Family

## 2019-10-30 DIAGNOSIS — Z23 Encounter for immunization: Secondary | ICD-10-CM

## 2019-10-30 NOTE — Progress Notes (Signed)
   Covid-19 Vaccination Clinic  Name:  ELWIN TSOU    MRN: [id number] DOB: Nov 21, 1928  10/30/2019  Mr. Willems was observed post Covid-19 immunization for 30 minutes based on pre-vaccination screening without incident. He was provided with Vaccine Information Sheet and instruction to access the V-Safe system.   Mr. Febo was instructed to call 911 with any severe reactions post vaccine: Marland Kitchen Difficulty breathing  . Swelling of face and throat  . A fast heartbeat  . A bad rash all over body  . Dizziness and weakness   Immunizations Administered    Name Date Dose VIS Date Route   Moderna COVID-19 Vaccine 10/30/2019  1:51 PM 0.5 mL 06/2019 Intramuscular   Manufacturer: Moderna   Lot: [id number]   NDC: 40814-481-85

## 2020-01-02 ENCOUNTER — Other Ambulatory Visit: Payer: Self-pay

## 2020-01-02 ENCOUNTER — Emergency Department (HOSPITAL_COMMUNITY): Payer: Medicare Other

## 2020-01-02 ENCOUNTER — Inpatient Hospital Stay (HOSPITAL_COMMUNITY)
Admission: RE | Admit: 2020-01-02 | Discharge: 2020-01-09 | DRG: 871 | Disposition: A | Discharge status: Skilled Nursing Facility | Payer: Medicare Other | Source: Skilled Nursing Facility | Attending: Family Medicine | Admitting: Family Medicine

## 2020-01-02 DIAGNOSIS — E1169 Type 2 diabetes mellitus with other specified complication: Secondary | ICD-10-CM | POA: Diagnosis present

## 2020-01-02 DIAGNOSIS — D696 Thrombocytopenia, unspecified: Secondary | ICD-10-CM | POA: Diagnosis present

## 2020-01-02 DIAGNOSIS — I5022 Chronic systolic (congestive) heart failure: Secondary | ICD-10-CM | POA: Diagnosis not present

## 2020-01-02 DIAGNOSIS — Z515 Encounter for palliative care: Secondary | ICD-10-CM | POA: Diagnosis not present

## 2020-01-02 DIAGNOSIS — I82409 Acute embolism and thrombosis of unspecified deep veins of unspecified lower extremity: Secondary | ICD-10-CM

## 2020-01-02 DIAGNOSIS — E872 Acidosis, unspecified: Secondary | ICD-10-CM | POA: Diagnosis present

## 2020-01-02 DIAGNOSIS — A4150 Gram-negative sepsis, unspecified: Principal | ICD-10-CM | POA: Diagnosis present

## 2020-01-02 DIAGNOSIS — N179 Acute kidney failure, unspecified: Secondary | ICD-10-CM | POA: Diagnosis not present

## 2020-01-02 DIAGNOSIS — A419 Sepsis, unspecified organism: Secondary | ICD-10-CM | POA: Diagnosis present

## 2020-01-02 DIAGNOSIS — R6521 Severe sepsis with septic shock: Secondary | ICD-10-CM | POA: Diagnosis present

## 2020-01-02 DIAGNOSIS — I361 Nonrheumatic tricuspid (valve) insufficiency: Secondary | ICD-10-CM | POA: Diagnosis not present

## 2020-01-02 DIAGNOSIS — E861 Hypovolemia: Secondary | ICD-10-CM | POA: Diagnosis present

## 2020-01-02 DIAGNOSIS — I4891 Unspecified atrial fibrillation: Secondary | ICD-10-CM | POA: Diagnosis present

## 2020-01-02 DIAGNOSIS — R54 Age-related physical debility: Secondary | ICD-10-CM | POA: Diagnosis present

## 2020-01-02 DIAGNOSIS — A4151 Sepsis due to Escherichia coli [E. coli]: Secondary | ICD-10-CM | POA: Diagnosis present

## 2020-01-02 DIAGNOSIS — I4510 Unspecified right bundle-branch block: Secondary | ICD-10-CM | POA: Diagnosis present

## 2020-01-02 DIAGNOSIS — Z8673 Personal history of transient ischemic attack (TIA), and cerebral infarction without residual deficits: Secondary | ICD-10-CM | POA: Diagnosis not present

## 2020-01-02 DIAGNOSIS — L8990 Pressure ulcer of unspecified site, unspecified stage: Secondary | ICD-10-CM | POA: Insufficient documentation

## 2020-01-02 DIAGNOSIS — N170 Acute kidney failure with tubular necrosis: Secondary | ICD-10-CM | POA: Diagnosis present

## 2020-01-02 DIAGNOSIS — R748 Abnormal levels of other serum enzymes: Secondary | ICD-10-CM | POA: Diagnosis present

## 2020-01-02 DIAGNOSIS — I878 Other specified disorders of veins: Secondary | ICD-10-CM | POA: Diagnosis present

## 2020-01-02 DIAGNOSIS — N12 Tubulo-interstitial nephritis, not specified as acute or chronic: Secondary | ICD-10-CM

## 2020-01-02 DIAGNOSIS — K831 Obstruction of bile duct: Secondary | ICD-10-CM | POA: Diagnosis present

## 2020-01-02 DIAGNOSIS — D649 Anemia, unspecified: Secondary | ICD-10-CM | POA: Diagnosis present

## 2020-01-02 DIAGNOSIS — R17 Unspecified jaundice: Secondary | ICD-10-CM | POA: Diagnosis present

## 2020-01-02 DIAGNOSIS — N400 Enlarged prostate without lower urinary tract symptoms: Secondary | ICD-10-CM | POA: Diagnosis present

## 2020-01-02 DIAGNOSIS — K7200 Acute and subacute hepatic failure without coma: Secondary | ICD-10-CM | POA: Diagnosis present

## 2020-01-02 DIAGNOSIS — I5042 Chronic combined systolic (congestive) and diastolic (congestive) heart failure: Secondary | ICD-10-CM | POA: Diagnosis present

## 2020-01-02 DIAGNOSIS — E1122 Type 2 diabetes mellitus with diabetic chronic kidney disease: Secondary | ICD-10-CM | POA: Diagnosis present

## 2020-01-02 DIAGNOSIS — Z7901 Long term (current) use of anticoagulants: Secondary | ICD-10-CM

## 2020-01-02 DIAGNOSIS — E785 Hyperlipidemia, unspecified: Secondary | ICD-10-CM | POA: Diagnosis present

## 2020-01-02 DIAGNOSIS — Z86711 Personal history of pulmonary embolism: Secondary | ICD-10-CM

## 2020-01-02 DIAGNOSIS — M179 Osteoarthritis of knee, unspecified: Secondary | ICD-10-CM | POA: Diagnosis present

## 2020-01-02 DIAGNOSIS — Z86718 Personal history of other venous thrombosis and embolism: Secondary | ICD-10-CM | POA: Diagnosis not present

## 2020-01-02 DIAGNOSIS — Z20822 Contact with and (suspected) exposure to covid-19: Secondary | ICD-10-CM | POA: Diagnosis present

## 2020-01-02 DIAGNOSIS — I130 Hypertensive heart and chronic kidney disease with heart failure and stage 1 through stage 4 chronic kidney disease, or unspecified chronic kidney disease: Secondary | ICD-10-CM | POA: Diagnosis present

## 2020-01-02 DIAGNOSIS — L89152 Pressure ulcer of sacral region, stage 2: Secondary | ICD-10-CM | POA: Diagnosis present

## 2020-01-02 DIAGNOSIS — I10 Essential (primary) hypertension: Secondary | ICD-10-CM | POA: Diagnosis not present

## 2020-01-02 DIAGNOSIS — N390 Urinary tract infection, site not specified: Secondary | ICD-10-CM | POA: Diagnosis present

## 2020-01-02 DIAGNOSIS — R600 Localized edema: Secondary | ICD-10-CM | POA: Diagnosis present

## 2020-01-02 DIAGNOSIS — G9341 Metabolic encephalopathy: Secondary | ICD-10-CM | POA: Diagnosis present

## 2020-01-02 DIAGNOSIS — J9601 Acute respiratory failure with hypoxia: Secondary | ICD-10-CM | POA: Diagnosis present

## 2020-01-02 DIAGNOSIS — N281 Cyst of kidney, acquired: Secondary | ICD-10-CM | POA: Diagnosis present

## 2020-01-02 DIAGNOSIS — N1831 Chronic kidney disease, stage 3a: Secondary | ICD-10-CM | POA: Diagnosis present

## 2020-01-02 DIAGNOSIS — M79641 Pain in right hand: Secondary | ICD-10-CM | POA: Diagnosis not present

## 2020-01-02 DIAGNOSIS — I340 Nonrheumatic mitral (valve) insufficiency: Secondary | ICD-10-CM | POA: Diagnosis not present

## 2020-01-02 DIAGNOSIS — I447 Left bundle-branch block, unspecified: Secondary | ICD-10-CM | POA: Diagnosis present

## 2020-01-02 DIAGNOSIS — I071 Rheumatic tricuspid insufficiency: Secondary | ICD-10-CM | POA: Diagnosis present

## 2020-01-02 DIAGNOSIS — Z7189 Other specified counseling: Secondary | ICD-10-CM | POA: Diagnosis not present

## 2020-01-02 DIAGNOSIS — I493 Ventricular premature depolarization: Secondary | ICD-10-CM | POA: Diagnosis present

## 2020-01-02 DIAGNOSIS — Z79899 Other long term (current) drug therapy: Secondary | ICD-10-CM

## 2020-01-02 DIAGNOSIS — R5381 Other malaise: Secondary | ICD-10-CM | POA: Diagnosis present

## 2020-01-02 HISTORY — DX: Acute embolism and thrombosis of unspecified deep veins of unspecified lower extremity: I82.409

## 2020-01-02 LAB — URINALYSIS, ROUTINE W REFLEX MICROSCOPIC
Bilirubin Urine: NEGATIVE
Glucose, UA: NEGATIVE mg/dL
Ketones, ur: NEGATIVE mg/dL
Nitrite: NEGATIVE
Protein, ur: 100 mg/dL — AB
RBC / HPF: >50 RBC/hpf — ABNORMAL HIGH (ref 0–5)
Specific Gravity, Urine: 1.019 (ref 1.005–1.030)
WBC, UA: >50 WBC/hpf — ABNORMAL HIGH (ref 0–5)
pH: 5 (ref 5.0–8.0)

## 2020-01-02 LAB — CBC
HCT: 33.9 % — ABNORMAL LOW (ref 39.0–52.0)
Hemoglobin: 10.9 g/dL — ABNORMAL LOW (ref 13.0–17.0)
MCH: 27.9 pg (ref 26.0–34.0)
MCHC: 32.2 g/dL (ref 30.0–36.0)
MCV: 86.7 fL (ref 80.0–100.0)
Platelets: 93 10*3/uL — ABNORMAL LOW (ref 150–400)
RBC: 3.91 MIL/uL — ABNORMAL LOW (ref 4.22–5.81)
RDW: 15.5 % (ref 11.5–15.5)
WBC: 20.7 10*3/uL — ABNORMAL HIGH (ref 4.0–10.5)
nRBC: 0 % (ref 0.0–0.2)

## 2020-01-02 LAB — PHOSPHORUS: Phosphorus: 2.8 mg/dL (ref 2.5–4.6)

## 2020-01-02 LAB — COMPREHENSIVE METABOLIC PANEL
ALT: 130 U/L — ABNORMAL HIGH (ref 0–44)
AST: 100 U/L — ABNORMAL HIGH (ref 15–41)
Albumin: 2.6 g/dL — ABNORMAL LOW (ref 3.5–5.0)
Alkaline Phosphatase: 210 U/L — ABNORMAL HIGH (ref 38–126)
Anion gap: 18 — ABNORMAL HIGH (ref 5–15)
BUN: 38 mg/dL — ABNORMAL HIGH (ref 8–23)
CO2: 19 mmol/L — ABNORMAL LOW (ref 22–32)
Calcium: 8.4 mg/dL — ABNORMAL LOW (ref 8.9–10.3)
Chloride: 100 mmol/L (ref 98–111)
Creatinine, Ser: 3.32 mg/dL — ABNORMAL HIGH (ref 0.61–1.24)
GFR calc Af Amer: 18 mL/min — ABNORMAL LOW (ref >60)
GFR calc non Af Amer: 16 mL/min — ABNORMAL LOW (ref >60)
Glucose, Bld: 75 mg/dL (ref 70–99)
Potassium: 4.1 mmol/L (ref 3.5–5.1)
Sodium: 137 mmol/L (ref 135–145)
Total Bilirubin: 6.1 mg/dL — ABNORMAL HIGH (ref 0.3–1.2)
Total Protein: 6.2 g/dL — ABNORMAL LOW (ref 6.5–8.1)

## 2020-01-02 LAB — CBC WITH DIFFERENTIAL/PLATELET
Abs Immature Granulocytes: 0.26 10*3/uL — ABNORMAL HIGH (ref 0.00–0.07)
Basophils Absolute: 0.1 10*3/uL (ref 0.0–0.1)
Basophils Relative: 0 %
Eosinophils Absolute: 0.2 10*3/uL (ref 0.0–0.5)
Eosinophils Relative: 1 %
HCT: 38.1 % — ABNORMAL LOW (ref 39.0–52.0)
Hemoglobin: 12.4 g/dL — ABNORMAL LOW (ref 13.0–17.0)
Immature Granulocytes: 1 %
Lymphocytes Relative: 2 %
Lymphs Abs: 0.5 10*3/uL — ABNORMAL LOW (ref 0.7–4.0)
MCH: 28.1 pg (ref 26.0–34.0)
MCHC: 32.5 g/dL (ref 30.0–36.0)
MCV: 86.4 fL (ref 80.0–100.0)
Monocytes Absolute: 0.9 10*3/uL (ref 0.1–1.0)
Monocytes Relative: 3 %
Neutro Abs: 23.4 10*3/uL — ABNORMAL HIGH (ref 1.7–7.7)
Neutrophils Relative %: 93 %
Platelets: 110 10*3/uL — ABNORMAL LOW (ref 150–400)
RBC: 4.41 MIL/uL (ref 4.22–5.81)
RDW: 15.5 % (ref 11.5–15.5)
WBC: 25.2 10*3/uL — ABNORMAL HIGH (ref 4.0–10.5)
nRBC: 0 % (ref 0.0–0.2)

## 2020-01-02 LAB — HEPATITIS PANEL, ACUTE
HCV Ab: NONREACTIVE
Hep A IgM: NONREACTIVE
Hep B C IgM: NONREACTIVE
Hepatitis B Surface Ag: NONREACTIVE

## 2020-01-02 LAB — I-STAT VENOUS BLOOD GAS, ED
Acid-base deficit: 4 mmol/L — ABNORMAL HIGH (ref 0.0–2.0)
Bicarbonate: 20.4 mmol/L (ref 20.0–28.0)
Calcium, Ion: 1.01 mmol/L — ABNORMAL LOW (ref 1.15–1.40)
HCT: 39 % (ref 39.0–52.0)
Hemoglobin: 13.3 g/dL (ref 13.0–17.0)
O2 Saturation: 69 %
Potassium: 4.1 mmol/L (ref 3.5–5.1)
Sodium: 138 mmol/L (ref 135–145)
TCO2: 21 mmol/L — ABNORMAL LOW (ref 22–32)
pCO2, Ven: 32.8 mmHg — ABNORMAL LOW (ref 44.0–60.0)
pH, Ven: 7.4 (ref 7.250–7.430)
pO2, Ven: 35 mmHg (ref 32.0–45.0)

## 2020-01-02 LAB — GLUCOSE, CAPILLARY: Glucose-Capillary: 63 mg/dL — ABNORMAL LOW (ref 70–99)

## 2020-01-02 LAB — CREATININE, SERUM
Creatinine, Ser: 3.22 mg/dL — ABNORMAL HIGH (ref 0.61–1.24)
GFR calc Af Amer: 19 mL/min — ABNORMAL LOW (ref >60)
GFR calc non Af Amer: 16 mL/min — ABNORMAL LOW (ref >60)

## 2020-01-02 LAB — BRAIN NATRIURETIC PEPTIDE: B Natriuretic Peptide: 441.2 pg/mL — ABNORMAL HIGH (ref 0.0–100.0)

## 2020-01-02 LAB — PROCALCITONIN: Procalcitonin: >150 ng/mL

## 2020-01-02 LAB — CBG MONITORING, ED: Glucose-Capillary: 81 mg/dL (ref 70–99)

## 2020-01-02 LAB — LIPASE, BLOOD: Lipase: 18 U/L (ref 11–51)

## 2020-01-02 LAB — MRSA PCR SCREENING: MRSA by PCR: NEGATIVE

## 2020-01-02 LAB — AMYLASE: Amylase: 175 U/L — ABNORMAL HIGH (ref 28–100)

## 2020-01-02 LAB — LACTIC ACID, PLASMA
Lactic Acid, Venous: 6.1 mmol/L (ref 0.5–1.9)
Lactic Acid, Venous: 3.5 mmol/L (ref 0.5–1.9)

## 2020-01-02 LAB — PROTIME-INR
INR: 2.9 — ABNORMAL HIGH (ref 0.8–1.2)
Prothrombin Time: 29.6 seconds — ABNORMAL HIGH (ref 11.4–15.2)

## 2020-01-02 LAB — AMMONIA: Ammonia: 34 umol/L (ref 9–35)

## 2020-01-02 LAB — SARS CORONAVIRUS 2 BY RT PCR (HOSPITAL ORDER, PERFORMED IN ~~LOC~~ HOSPITAL LAB): SARS Coronavirus 2: NEGATIVE

## 2020-01-02 LAB — APTT: aPTT: 48 seconds — ABNORMAL HIGH (ref 24–36)

## 2020-01-02 LAB — MAGNESIUM: Magnesium: 1.2 mg/dL — ABNORMAL LOW (ref 1.7–2.4)

## 2020-01-02 MED ORDER — TAMSULOSIN HCL 0.4 MG PO CAPS
0.4000 mg | ORAL_CAPSULE | Freq: Every day | ORAL | Status: DC
  Administered 2020-01-03 – 2020-01-08 (×6): 0.4 mg via ORAL
  Filled 2020-01-02 (×7): qty 1

## 2020-01-02 MED ORDER — NOREPINEPHRINE 4 MG/250ML-% IV SOLN
0.0000 ug/min | INTRAVENOUS | Status: DC
  Administered 2020-01-02: 18 ug/min via INTRAVENOUS
  Administered 2020-01-03: 4 ug/min via INTRAVENOUS
  Administered 2020-01-03: 16 ug/min via INTRAVENOUS
  Administered 2020-01-04: 2 ug/min via INTRAVENOUS
  Filled 2020-01-02 (×2): qty 250
  Filled 2020-01-02: qty 500
  Filled 2020-01-02: qty 250

## 2020-01-02 MED ORDER — SODIUM CHLORIDE 0.9 % IV SOLN
2.0000 g | INTRAVENOUS | Status: DC
  Administered 2020-01-03 – 2020-01-05 (×3): 2 g via INTRAVENOUS
  Filled 2020-01-02 (×3): qty 2

## 2020-01-02 MED ORDER — ALBUMIN HUMAN 25 % IV SOLN
12.5000 g | Freq: Once | INTRAVENOUS | Status: AC
  Administered 2020-01-02: 12.5 g via INTRAVENOUS
  Filled 2020-01-02: qty 50

## 2020-01-02 MED ORDER — LACTATED RINGERS IV BOLUS (SEPSIS)
500.0000 mL | Freq: Once | INTRAVENOUS | Status: AC
  Administered 2020-01-02: 500 mL via INTRAVENOUS

## 2020-01-02 MED ORDER — LORAZEPAM 2 MG/ML IJ SOLN
INTRAMUSCULAR | Status: AC
  Administered 2020-01-02: 0.5 mg via INTRAVENOUS
  Filled 2020-01-02: qty 1

## 2020-01-02 MED ORDER — VANCOMYCIN VARIABLE DOSE PER UNSTABLE RENAL FUNCTION (PHARMACIST DOSING): Status: DC

## 2020-01-02 MED ORDER — LORAZEPAM 2 MG/ML IJ SOLN
INTRAMUSCULAR | Status: AC
  Filled 2020-01-02: qty 1

## 2020-01-02 MED ORDER — ONDANSETRON HCL 4 MG/2ML IJ SOLN: 4.0000 mg | Freq: Four times a day (QID) | INTRAMUSCULAR | Status: DC | PRN

## 2020-01-02 MED ORDER — GUAIFENESIN ER 600 MG PO TB12
600.0000 mg | ORAL_TABLET | Freq: Two times a day (BID) | ORAL | Status: DC
  Administered 2020-01-03 – 2020-01-09 (×13): 600 mg via ORAL
  Filled 2020-01-02 (×14): qty 1

## 2020-01-02 MED ORDER — AZITHROMYCIN 500 MG PO TABS
500.0000 mg | ORAL_TABLET | Freq: Every day | ORAL | Status: AC
  Administered 2020-01-02: 500 mg via ORAL
  Filled 2020-01-02: qty 1

## 2020-01-02 MED ORDER — CHLORHEXIDINE GLUCONATE CLOTH 2 % EX PADS
6.0000 | MEDICATED_PAD | Freq: Every day | CUTANEOUS | Status: DC
  Administered 2020-01-03 – 2020-01-08 (×6): 6 via TOPICAL

## 2020-01-02 MED ORDER — POLYETHYLENE GLYCOL 3350 17 G PO PACK: 17.0000 g | PACK | Freq: Every day | ORAL | Status: DC | PRN

## 2020-01-02 MED ORDER — VANCOMYCIN HCL 1500 MG/300ML IV SOLN
1500.0000 mg | Freq: Once | INTRAVENOUS | Status: AC
  Administered 2020-01-02: 1500 mg via INTRAVENOUS
  Filled 2020-01-02: qty 300

## 2020-01-02 MED ORDER — LACTATED RINGERS IV BOLUS (SEPSIS)
1000.0000 mL | Freq: Once | INTRAVENOUS | Status: AC
  Administered 2020-01-02: 1000 mL via INTRAVENOUS

## 2020-01-02 MED ORDER — ACETAMINOPHEN 325 MG PO TABS
650.0000 mg | ORAL_TABLET | Freq: Four times a day (QID) | ORAL | Status: DC | PRN
  Administered 2020-01-02 (×2): 650 mg via ORAL
  Filled 2020-01-02 (×2): qty 2

## 2020-01-02 MED ORDER — SODIUM CHLORIDE 0.9 % IV SOLN: INTRAVENOUS | Status: DC

## 2020-01-02 MED ORDER — ACETAMINOPHEN 650 MG RE SUPP
650.0000 mg | Freq: Four times a day (QID) | RECTAL | Status: DC | PRN
  Filled 2020-01-02: qty 1

## 2020-01-02 MED ORDER — ALBUMIN HUMAN 25 % IV SOLN
12.5000 g | Freq: Once | INTRAVENOUS | Status: DC
  Filled 2020-01-02: qty 50

## 2020-01-02 MED ORDER — DOCUSATE SODIUM 100 MG PO CAPS
100.0000 mg | ORAL_CAPSULE | Freq: Two times a day (BID) | ORAL | Status: DC | PRN
  Administered 2020-01-09: 100 mg via ORAL
  Filled 2020-01-02: qty 1

## 2020-01-02 MED ORDER — LORAZEPAM 2 MG/ML IJ SOLN
0.5000 mg | INTRAMUSCULAR | Status: AC | PRN
  Administered 2020-01-02: 0.5 mg via INTRAVENOUS

## 2020-01-02 MED ORDER — SODIUM CHLORIDE 0.9 % IV SOLN: 250.0000 mL | INTRAVENOUS | Status: DC

## 2020-01-02 MED ORDER — LACTATED RINGERS IV BOLUS
1000.0000 mL | Freq: Once | INTRAVENOUS | Status: AC
  Administered 2020-01-02: 1000 mL via INTRAVENOUS

## 2020-01-02 MED ORDER — SODIUM CHLORIDE 0.9 % IV SOLN
INTRAVENOUS | Status: DC | PRN
  Administered 2020-01-02: 250 mL via INTRAVENOUS
  Administered 2020-01-05: 1000 mL via INTRAVENOUS

## 2020-01-02 MED ORDER — APIXABAN 5 MG PO TABS
5.0000 mg | ORAL_TABLET | Freq: Two times a day (BID) | ORAL | Status: DC
  Filled 2020-01-02: qty 1

## 2020-01-02 MED ORDER — NOREPINEPHRINE 4 MG/250ML-% IV SOLN
2.0000 ug/min | INTRAVENOUS | Status: DC
  Administered 2020-01-02: 10 ug/min via INTRAVENOUS
  Administered 2020-01-02: 2 ug/min via INTRAVENOUS
  Filled 2020-01-02: qty 250

## 2020-01-02 MED ORDER — ALBUTEROL SULFATE (2.5 MG/3ML) 0.083% IN NEBU
2.5000 mg | INHALATION_SOLUTION | RESPIRATORY_TRACT | Status: DC | PRN
  Administered 2020-01-02 – 2020-01-03 (×2): 2.5 mg via RESPIRATORY_TRACT
  Filled 2020-01-02 (×2): qty 3

## 2020-01-02 MED ORDER — SODIUM CHLORIDE 0.9 % IV SOLN
2.0000 g | Freq: Once | INTRAVENOUS | Status: AC
  Administered 2020-01-02: 2 g via INTRAVENOUS
  Filled 2020-01-02: qty 2

## 2020-01-02 MED ORDER — LACTATED RINGERS IV SOLN: INTRAVENOUS | Status: DC

## 2020-01-02 MED ORDER — NOREPINEPHRINE 4 MG/250ML-% IV SOLN
0.0000 ug/min | INTRAVENOUS | Status: DC
  Filled 2020-01-02: qty 250

## 2020-01-02 MED ORDER — MAGNESIUM SULFATE 2 GM/50ML IV SOLN
2.0000 g | Freq: Once | INTRAVENOUS | Status: AC
  Administered 2020-01-02: 2 g via INTRAVENOUS
  Filled 2020-01-02: qty 50

## 2020-01-02 MED ORDER — ONDANSETRON HCL 4 MG PO TABS: 4.0000 mg | ORAL_TABLET | Freq: Four times a day (QID) | ORAL | Status: DC | PRN

## 2020-01-02 MED ORDER — AZITHROMYCIN 500 MG PO TABS: 250.0000 mg | ORAL_TABLET | Freq: Every day | ORAL | Status: DC

## 2020-01-02 MED ORDER — METRONIDAZOLE IN NACL 5-0.79 MG/ML-% IV SOLN
500.0000 mg | Freq: Once | INTRAVENOUS | Status: AC
  Administered 2020-01-02: 500 mg via INTRAVENOUS
  Filled 2020-01-02: qty 100

## 2020-01-02 NOTE — Consult Note (Addendum)
NAME:  Mark Bullock, MRN:  [id number], DOB:  1929/01/23, LOS: 0 ADMISSION DATE:  01/02/2020, CONSULTATION DATE:  01/02/20 REFERRING MD:  Anitra Lauth- EM , CHIEF COMPLAINT:  AMS, hypotension   Brief History   86 yo M front nursing facility with AMS. Very poor PO intake x several days per patient. Hypotensive in ED, received 34ml/kg IVF. PCCM called for admission but upon arrival to ED, pt is HDS.   History of present illness   86 yo M nursing facility resident with PMH CVA, CKD 3, systolic heart failure, HTN, DVT Afib on eliquis, BLE cellulitis who presents to ED via EMS for AMS. AMS began 12 hours prior to presentation. Pt also notes that he has felt sick to stomach since a "bad meal" at nursing facility, and has had no appetite since due to nausea. He has baseline high stool output. He feels more fatigued and notes that he has an intermittent cough. In ED, pt noted to by hypotensive, he was resuscitated with 30cc/kg. Noted to have elevated WBC 25.2, initial LA 6.2 (down to 3.5 after IVF), AG 18, Cr 3.32, Mild transaminitis and  INR 2.9. Urine is amber and turbid appearing, with many bacteria and >50 WBC. TBili 6.1   He is HDS at time of PCCM consult.   Past Medical History  CVA Afib Systolic heart failure CKD DVT HTN Dm2 HLD  Significant Hospital Events   6/25 presents to ED, volume resuscitated. Evaluated by PCCM   Consults:  PCCM   Procedures:    Significant Diagnostic Tests:  RUS US> mildly prominent common bile duct without definitive stone. Incidental finding of R renal cyst  CXR> chronic asymmetric R hemidiaphragm elevation. No acute abnormality   Micro Data:  5/25 BCx>> 5/25 UCx>>  Antimicrobials:  6/25 vanc, cefepime, flagyl  Interim history/subjective:  HDS after volume resuscitation in ED   Objective   Blood pressure (!) 123/92, pulse 94, temperature 99.6 F (37.6 C), temperature source Rectal, resp. rate (!) 36, height 5\' 7"  (1.702 m), weight 79.4 kg,  SpO2 95 %.        Intake/Output Summary (Last 24 hours) at 01/02/2020 1340 Last data filed at 01/02/2020 1304 Gross per 24 hour  Intake 3150 ml  Output --  Net 3150 ml   Filed Weights   01/02/20 0943  Weight: 79.4 kg    Examination: General: Chronically ill appearing elderly M NAD  HENT: NCAT dry mm. Trachea midline.  Lungs: Faint expiratory wheeze. No crakcles, no rhonchi. No accessoyr muscle use  Cardiovascular: IRIR s1s2, cap refill < 3 seconds BUE Abdomen: Nontender, + bowel sounds. Slightly tense and distended Extremities: No cyanosis or clubbing. Chronic changes BLE with mild edema Neuro: AAO x 2-3. Intermittently confused, tangential thoughts GU: WNL  Resolved Hospital Problem list    Assessment & Plan:   Acute Encephalopathy  -possibly multifactorial with dehydration, possible sepsis Hx CVA -unknown if residual deficit P -correct metabolic abnormalities as able -limit CNS depressing meds   Hypotension -largely responsive to fluids which aligns with poor PO intake + hx diarrhea  -possible distributive component with suspected sepsis  P -recommend further fluid resuscitation. I have ordered additional 1 L IVF (over 2 hours) followed by IVF 51ml/hr -Pressors ordered in ED but not started, given HDS at this time, I have discontinued this order.  -recommend holding antihypertensives   Suspected Sepsis AGMA Lactic Acidosis  -improving with IVF  Abdominal distension with hyperbilirubinemia and mild transaminitis -RUQ 45m with prominent CBD  P -broad abx  -consider abd workup   AKI on CKD3 -suspect hypovolemic with poor PO intake -incidental R renal cyst on RUQ [place]  P -IVF -trend renal indices. Could consider FENa and renal US if not improving with volume   Faint expiratory wheeze -PRN BDs  -Supportive O2 as needed   Other problems: Chronic systolic heart failure Afib on eliquis  DM2 BPH HTN      Thank you for consulting PCCM. PCCM will sign  off at this time, please re-engage if needed.   Best practice:  Diet: Recommend NPO with ongoing IVF  Family Communication: per primary Disposition: Stable for admission for TRH outside of ICU   Labs   CBC: Recent Labs  Lab 01/02/20 0951 01/02/20 1047  WBC  --  25.2*  NEUTROABS  --  23.4*  HGB 13.3 12.4*  HCT 39.0 38.1*  MCV  --  86.4  PLT  --  110*    Basic Metabolic Panel: Recent Labs  Lab 01/02/20 0951 01/02/20 1047  NA 138 137  K 4.1 4.1  CL  --  100  CO2  --  19*  GLUCOSE  --  75  BUN  --  38*  CREATININE  --  3.32*  CALCIUM  --  8.4*   GFR: Estimated Creatinine Clearance: 15.2 mL/min (A) (by C-G formula based on SCr of 3.32 mg/dL (H)). Recent Labs  Lab 01/02/20 1011 01/02/20 1047  WBC  --  25.2*  LATICACIDVEN 6.1*  --     Liver Function Tests: Recent Labs  Lab 01/02/20 1047  AST 100*  ALT 130*  ALKPHOS 210*  BILITOT 6.1*  PROT 6.2*  ALBUMIN 2.6*   No results for input(s): LIPASE, AMYLASE in the last 168 hours. No results for input(s): AMMONIA in the last 168 hours.  ABG    Component Value Date/Time   HCO3 20.4 01/02/2020 0951   TCO2 21 (L) 01/02/2020 0951   ACIDBASEDEF 4.0 (H) 01/02/2020 0951   O2SAT 69.0 01/02/2020 0951     Coagulation Profile: Recent Labs  Lab 01/02/20 1047  INR 2.9*    Cardiac Enzymes: No results for input(s): CKTOTAL, CKMB, CKMBINDEX, TROPONINI in the last 168 hours.  HbA1C: Hgb A1c MFr Bld  Date/Time Value Ref Range Status  09/21/2019 05:57 AM 5.3 4.8 - 5.6 % Final    Comment:    (NOTE) Pre diabetes:          5.7%-6.4% Diabetes:              >6.4% Glycemic control for   <7.0% adults with diabetes   03/29/2019 04:57 AM 5.5 4.8 - 5.6 % Final    Comment:    (NOTE) Pre diabetes:          5.7%-6.4% Diabetes:              >6.4% Glycemic control for   <7.0% adults with diabetes     CBG: Recent Labs  Lab 01/02/20 0936  GLUCAP 81    Review of Systems:   Endorses nausea, diarrhea. Denies  abdominal pain Endorses decreased PO intake Denies chest pain, palpitations Endorses cough with sputum production. Denies SOB Denies HA Dizziness Denies changes to hair skin nails   Past Medical History  He,  has a past medical history of AKI (acute kidney injury) ([place]) (05/18/2017), Diabetes mellitus without complication ([place]), Hyperlipidemia, Hypertension, Left-sided weakness, and Stroke ([place]).   Surgical History    Past Surgical History:  Procedure Laterality Date  .  APPENDECTOMY    . KNEE SURGERY       Social History   reports that he has never smoked. He has never used smokeless tobacco. He reports that he does not drink alcohol and does not use drugs.   Family History   His Family history is unknown by patient.   Allergies No Known Allergies   Home Medications  Prior to Admission medications   Medication Sig Start Date End Date Taking? Authorizing Provider  apixaban (ELIQUIS) 5 MG TABS tablet Take 5 mg by mouth 2 (two) times daily.   Yes [provider]  cetirizine (ZYRTEC) 10 MG tablet Take 10 mg by mouth daily as needed for allergies.   Yes [provider]  furosemide (LASIX) 40 MG tablet Take 0.5 tablets (20 mg total) by mouth daily. 04/01/19  Yes Dhungel, Nishant, MD  polyethylene glycol (MIRALAX / GLYCOLAX) 17 g packet Take 17 g by mouth daily.   Yes [provider]  tamsulosin (FLOMAX) 0.4 MG CAPS capsule Take 1 capsule (0.4 mg total) by mouth at bedtime. 09/25/19  Yes Briant Cedar, MD  feeding supplement, ENSURE ENLIVE, (ENSURE ENLIVE) LIQD Take 237 mLs by mouth 2 (two) times daily between meals. 04/01/19   Dhungel, Nishant, MD  hydrocerin (EUCERIN) CREA Apply 1 application topically 2 (two) times daily. Bilateral lower legs Patient not taking: Reported on 09/20/2019 04/01/19   Dhungel, Theda Belfast, MD  lisinopril (ZESTRIL) 5 MG tablet Take 0.5 tablets (2.5 mg total) by mouth daily. Patient not taking: Reported on 01/02/2020 04/01/19    Eddie North, MD     Tessie Fass MSN, AGACNP-BC [place] [facility]/Critical [facility] Medicine [phone] If no answer, [phone] 01/02/2020, 1:40 PM

## 2020-01-02 NOTE — ED Notes (Signed)
Pt transported to ultrasound.

## 2020-01-02 NOTE — Progress Notes (Signed)
eLink Physician-Brief Progress Note Patient Name: Mark Bullock DOB: November 07, 1928 MRN: [id number]   Date of Service  01/02/2020  HPI/Events of Note  Pharmacy question about Apixaban. INR = 2.9 this AM likely d/t liver dysfunction.  eICU Interventions  Plan: 1. Do not give Apixaban.  2. Follow serial INR in AM.      Intervention Category Major Interventions: Other:  Lenell Antu 01/02/2020, 8:19 PM

## 2020-01-02 NOTE — Progress Notes (Signed)
Pt had increased blood pressures due to shivering from fever. Once Tylenol was given and fever decreased, shivering stopped and SBPs were 70s-80s. Dr, Wynona Neat made aware. Order for one dose of albumin to be given and order for levophed was given if needed.

## 2020-01-02 NOTE — ED Notes (Signed)
Pt has 21 ml in bladder per bladder scanner.

## 2020-01-02 NOTE — Procedures (Signed)
Central Venous Catheter Insertion Procedure Note  Mark Bullock  [id number]  10-27-1928  Date:01/02/20  Time:9:29 PM   Provider Performing:Evolette Pendell R Dejon Jungman   Procedure: Insertion of Non-tunneled Central Venous [phone]) with US guidance ([id number])   Indication(s) Medication administration  Consent Risks of the procedure as well as the alternatives and risks of each were explained to the patient and/or caregiver.  Consent for the procedure was obtained and is signed in the bedside chart  Anesthesia Topical only with 1% lidocaine   Timeout Verified patient identification, verified procedure, site/side was marked, verified correct patient position, special equipment/implants available, medications/allergies/relevant history reviewed, required imaging and test results available.  Sterile Technique Maximal sterile technique including full sterile barrier drape, hand hygiene, sterile gown, sterile gloves, mask, hair covering, sterile ultrasound probe cover (if used).  Procedure Description Area of catheter insertion was cleaned with chlorhexidine and draped in sterile fashion.  With real-time ultrasound guidance a central venous catheter was placed into the right femoral vein vein.  Nonpulsatile blood flow and easy flushing noted in all ports.  The catheter was sutured in place and sterile dressing applied.  Complications/Tolerance None; patient tolerated the procedure well. Chest X-ray is ordered to verify placement for internal jugular or subclavian cannulation.   Chest x-ray is not ordered for femoral cannulation.  EBL Minimal  Specimen(s) None   Mark Bullock Mark Kerce, PA-C

## 2020-01-02 NOTE — Progress Notes (Addendum)
Call from Dr. Jacqulyn Bath - [facility] admitting- regarding down-trend in SBP.  At time of PCCM eval pt HDS. Over past 2 hours, has trended downward now with SBP 80-100.  Will admit to ICU for further resuscitation.   Shock -Suspect great deal of hypovolemia with mixed distributive shock in setting of sepsis. I do appreciate systolic heart failure (EF 40%) however feel that volume resuscitation is most warranted at this time with very dry clinical exam findings + hx poor PO intake. Physical exam not concerning for cardiogenic shock component  P -ICU admit -Continue IVF.  -Will give1x albumin concomitantly with ongoing crystalloid  -MAP goal >65 with appropriate mentation.  -ECHO -Can consider peripheral pressors if needed, would favor NE given EF 40%    Tessie Fass MSN, AGACNP-BC [facility] Pulmonary/Critical Care Medicine [phone] If no answer, [phone] 01/02/2020, 3:42 PM

## 2020-01-02 NOTE — ED Notes (Signed)
Lab called to inform this RN that pt's lactic acid is 6.1. This RN notified PA Joy. Will continue to monitor pt.

## 2020-01-02 NOTE — ED Notes (Signed)
Pt's O2 dropped below 86% on RA. Pt placed on 4L [place]. Pt's O2 saturation is 92% on 4L.

## 2020-01-02 NOTE — Progress Notes (Addendum)
Daughter, Para March, called unit and RN gave her an update. All questions answered to her satisfaction. RN also explained current visitation policy and set up password "[place]." RN asked about who the designated visitors would be and she was not sure as there are 7 children. We told her we would call with any changes.  Para March is the primary contact person for this patient.

## 2020-01-02 NOTE — ED Provider Notes (Signed)
[facility] EMERGENCY DEPARTMENT Provider Note   CSN: [id number] Arrival date & time: 01/02/20  7371     History Chief Complaint  Patient presents with  . Altered Mental Status    Mark Bullock is a 86 y.o. male.  The history is provided by the EMS personnel and the patient.        Mark Bullock is a 86 y.o. male, with a history of PE, DVT, DM, hyperlipidemia, HTN, stroke, chronic systolic CHF, DVT, CKD stage III, presenting to the ED from SNF with reported altered mental status for the last 12 hours.  EMS reports patient was hypotensive with BP 78 systolic, seemed warm to the touch, was tachycardic at 110, and had capnography readings that were mostly in the 20s, but a couple that dipped down to 12.  Sepsis activation from the field.  Patient reports some loose stools this morning and some pain to his bilateral heels.   He denies cough, shortness of breath, chest pain, abdominal pain, N/V, hematochezia/melena, urinary symptoms, headache, skin abnormalities, acute edema, or any other complaints.   Past Medical History:  Diagnosis Date  . AKI (acute kidney injury) ([place]) 05/18/2017  . Diabetes mellitus without complication ([place])   . Hyperlipidemia   . Hypertension   . Left-sided weakness   . Stroke [facility])     Patient Active Problem List   Diagnosis Date Noted  . Sepsis ([place]) 01/02/2020  . High anion gap metabolic acidosis 01/03/9484  . Elevated liver enzymes 01/02/2020  . Total bilirubin, elevated 01/02/2020  . Thrombocytopenia ([place]) 01/02/2020  . Acute hypoxemic respiratory failure ([place]) 01/02/2020  . Acute lower UTI 01/02/2020  . DVT (deep venous thrombosis) ([place]) 01/02/2020  . Goals of care, counseling/discussion   . Palliative care encounter   . Cellulitis of both lower extremities 09/19/2019  . Cellulitis 09/19/2019  . Chronic systolic CHF (congestive heart failure) ([place]) 03/30/2019  . Leg DVT (deep venous thromboembolism), acute,  left ([place]) 03/30/2019  . Acute pulmonary embolism without acute cor pulmonale ([place]) 03/30/2019  . Cellulitis of leg, left 03/29/2019  . Chronic cerebrovascular accident (CVA) 07/07/2017  . Essential hypertension, benign 05/24/2017  . Type II diabetes mellitus with stage 3 chronic kidney disease ([place]) 05/24/2017  . Dyslipidemia associated with type 2 diabetes mellitus ([place]) 05/24/2017  . Vascular dementia without behavioral disturbance ([place]) 05/24/2017  . Vitamin B 12 deficiency 05/24/2017  . Rhabdomyolysis 05/19/2017  . AKI (acute kidney injury) ([place]) 05/18/2017  . Elevated troponin 05/18/2017  . History of completed stroke 05/18/2017  . CKD (chronic kidney disease), stage III 05/18/2017  . Physical deconditioning 05/08/2017  . Osteoarthritis 05/08/2017  . Bilateral lower extremity edema   . Abnormal EKG   . Elevated brain natriuretic peptide (BNP) level     Past Surgical History:  Procedure Laterality Date  . APPENDECTOMY    . KNEE SURGERY         Family History  Family history unknown: Yes    Social History   Tobacco Use  . Smoking status: Never Smoker  . Smokeless tobacco: Never Used  Substance Use Topics  . Alcohol use: No  . Drug use: No    Home Medications Prior to Admission medications   Medication Sig Start Date End Date Taking? Authorizing Provider  apixaban (ELIQUIS) 5 MG TABS tablet Take 5 mg by mouth 2 (two) times daily.   Yes [provider]  cetirizine (ZYRTEC) 10 MG tablet Take 10 mg by mouth daily  as needed for allergies.   Yes [provider]  furosemide (LASIX) 40 MG tablet Take 0.5 tablets (20 mg total) by mouth daily. 04/01/19  Yes Dhungel, Nishant, MD  polyethylene glycol (MIRALAX / GLYCOLAX) 17 g packet Take 17 g by mouth daily.   Yes [provider]  tamsulosin (FLOMAX) 0.4 MG CAPS capsule Take 1 capsule (0.4 mg total) by mouth at bedtime. 09/25/19  Yes Alma Friendly, MD  feeding supplement, ENSURE ENLIVE,  (ENSURE ENLIVE) LIQD Take 237 mLs by mouth 2 (two) times daily between meals. 04/01/19   Dhungel, Nishant, MD  hydrocerin (EUCERIN) CREA Apply 1 application topically 2 (two) times daily. Bilateral lower legs Patient not taking: Reported on 09/20/2019 04/01/19   Dhungel, Flonnie Overman, MD  lisinopril (ZESTRIL) 5 MG tablet Take 0.5 tablets (2.5 mg total) by mouth daily. Patient not taking: Reported on 01/02/2020 04/01/19   Louellen Molder, MD    Allergies    Patient has no known allergies.  Review of Systems   Review of Systems  Constitutional: Negative for diaphoresis.  Respiratory: Negative for cough and shortness of breath.   Cardiovascular: Negative for chest pain and leg swelling.  Gastrointestinal: Positive for diarrhea. Negative for abdominal pain, nausea and vomiting.  Genitourinary: Negative for dysuria, flank pain and hematuria.  Musculoskeletal: Negative for back pain.  Neurological: Negative for dizziness and weakness.  Psychiatric/Behavioral: Positive for confusion (reported).  All other systems reviewed and are negative.   Physical Exam Updated Vital Signs BP (!) 83/51   Pulse (!) 102   Temp 99.6 F (37.6 C) (Rectal)   Resp (!) 35   Ht '5\' 7"'  (1.702 m)   Wt 79.4 kg   SpO2 95%   BMI 27.41 kg/m   Physical Exam Vitals and nursing note reviewed.  Constitutional:      General: He is not in acute distress.    Appearance: He is well-developed. He is not diaphoretic.  HENT:     Head: Normocephalic and atraumatic.     Mouth/Throat:     Mouth: Mucous membranes are moist.     Pharynx: Oropharynx is clear.  Eyes:     Conjunctiva/sclera: Conjunctivae normal.  Cardiovascular:     Rate and Rhythm: Normal rate and regular rhythm.     Pulses:          Radial pulses are 1+ on the right side and 1+ on the left side.       Posterior tibial pulses are 1+ on the right side and 1+ on the left side.     Heart sounds: Normal heart sounds.     Comments: Tactile temperature in the  extremities appropriate and equal bilaterally. Patient does have palpable upper and lower extremity peripheral pulses. Pulmonary:     Effort: Pulmonary effort is normal. No respiratory distress.     Breath sounds: Normal breath sounds.  Abdominal:     Palpations: Abdomen is soft.     Tenderness: There is no abdominal tenderness. There is no guarding.  Genitourinary:    Comments: Scrotal edema noted without noted tenderness.  Patient states this is chronic with no acute changes.  No noted lesions to the genital region. No noted inguinal lymphadenopathy. Musculoskeletal:     Cervical back: Neck supple.     Comments: Patient has some apparent edema in the bilateral lower extremities, however, he states this is not new.  His lower extremities do not have signs of tenderness, abnormal tactile temperature, or erythema.  Patient's feet and  heels were examined since he states he is having pain in the heels.  No noted tenderness, wounds, swelling, color abnormality, or any other abnormal findings were noted in this region.  Lymphadenopathy:     Cervical: No cervical adenopathy.  Skin:    General: Skin is warm and dry.  Neurological:     Mental Status: He is alert.     Comments: Patient knows where he is, who he is, and knows it is June 2021. No noted acute cognitive deficit. Sensation grossly intact to light touch in the extremities.   Grip strengths equal bilaterally.   Strength 5/5 in all extremities.  Coordination intact.  Cranial nerves III-XII grossly intact.  Handles oral secretions without noted difficulty.  No noted phonation or speech deficit. No facial droop.   Psychiatric:        Mood and Affect: Mood and affect normal.        Speech: Speech normal.        Behavior: Behavior normal.     ED Results / Procedures / Treatments   Labs (all labs ordered are listed, but only abnormal results are displayed) Labs Reviewed  LACTIC ACID, PLASMA - Abnormal; Notable for the following  components:      Result Value   Lactic Acid, Venous 6.1 (*)    All other components within normal limits  LACTIC ACID, PLASMA - Abnormal; Notable for the following components:   Lactic Acid, Venous 3.5 (*)    All other components within normal limits  COMPREHENSIVE METABOLIC PANEL - Abnormal; Notable for the following components:   CO2 19 (*)    BUN 38 (*)    Creatinine, Ser 3.32 (*)    Calcium 8.4 (*)    Total Protein 6.2 (*)    Albumin 2.6 (*)    AST 100 (*)    ALT 130 (*)    Alkaline Phosphatase 210 (*)    Total Bilirubin 6.1 (*)    GFR calc non Af Amer 16 (*)    GFR calc Af Amer 18 (*)    Anion gap 18 (*)    All other components within normal limits  CBC WITH DIFFERENTIAL/PLATELET - Abnormal; Notable for the following components:   WBC 25.2 (*)    Hemoglobin 12.4 (*)    HCT 38.1 (*)    Platelets 110 (*)    Neutro Abs 23.4 (*)    Lymphs Abs 0.5 (*)    Abs Immature Granulocytes 0.26 (*)    All other components within normal limits  APTT - Abnormal; Notable for the following components:   aPTT 48 (*)    All other components within normal limits  PROTIME-INR - Abnormal; Notable for the following components:   Prothrombin Time 29.6 (*)    INR 2.9 (*)    All other components within normal limits  URINALYSIS, ROUTINE W REFLEX MICROSCOPIC - Abnormal; Notable for the following components:   Color, Urine AMBER (*)    APPearance TURBID (*)    Hgb urine dipstick MODERATE (*)    Protein, ur 100 (*)    Leukocytes,Ua SMALL (*)    RBC / HPF >50 (*)    WBC, UA >50 (*)    Bacteria, UA MANY (*)    All other components within normal limits  I-STAT VENOUS BLOOD GAS, ED - Abnormal; Notable for the following components:   pCO2, Ven 32.8 (*)    TCO2 21 (*)    Acid-base deficit 4.0 (*)    Calcium,  Ion 1.01 (*)    All other components within normal limits  SARS CORONAVIRUS 2 BY RT PCR (HOSPITAL ORDER, [facility])  CULTURE, BLOOD (ROUTINE X 2)  CULTURE,  BLOOD (ROUTINE X 2)  URINE CULTURE  AMMONIA  PROCALCITONIN  MAGNESIUM  PHOSPHORUS  CBG MONITORING, ED    EKG EKG Interpretation  Date/Time:  Friday January 02 2020 09:35:15 EDT Ventricular Rate:  106 PR Interval:    QRS Duration: 138 QT Interval:  369 QTC Calculation: 490 R Axis:   -70 Text Interpretation: Sinus tachycardia Multiform ventricular premature complexes RBBB and LAFB Nonspecific T abnormalities, lateral leads No significant change since last tracing Confirmed by Blanchie Dessert ([id number]) on 01/02/2020 9:39:59 AM   Radiology DG Chest Port 1 View  Result Date: 01/02/2020 CLINICAL DATA:  tachycardia and possible sepsis. EXAM: PORTABLE CHEST 1 VIEW COMPARISON:  09/19/2019 . FINDINGS: Chronic asymmetric elevation of the right hemidiaphragm. Heart size normal. No pleural effusion or edema identified. No acute osseous abnormality. IMPRESSION: 1. No acute cardiopulmonary abnormalities. 2. Chronic asymmetric elevation of the right hemidiaphragm. Electronically Signed   By: Kerby Moors M.D.   On: 01/02/2020 11:19   US Abdomen Limited RUQ  Result Date: 01/02/2020 CLINICAL DATA:  Elevated bilirubin EXAM: ULTRASOUND ABDOMEN LIMITED RIGHT UPPER QUADRANT COMPARISON:  None. FINDINGS: Gallbladder: Gallbladder is not well visualized. Common bile duct: Diameter: 10 mm which is slightly enlarged for the patient's given age. Liver: No focal lesion identified. Within normal limits in parenchymal echogenicity. Portal vein is patent on color Doppler imaging with normal direction of blood flow towards the liver. Other: Incidental note is made of right renal cyst measuring 5 cm IMPRESSION: Nonvisualization of the gallbladder. This may be due to decompression. Mildly prominent common bile duct. No definitive ductal stone is seen. Right renal cyst. Electronically Signed   By: Inez Catalina M.D.   On: 01/02/2020 12:58    Procedures .Critical Care Performed by: Lorayne Bender, PA-C Authorized by: Lorayne Bender, PA-C   Critical care provider statement:    Critical care time (minutes):  45   Critical care time was exclusive of:  Separately billable procedures and treating other patients   Critical care was necessary to treat or prevent imminent or life-threatening deterioration of the following conditions:  Sepsis, hepatic failure and renal failure   Critical care was time spent personally by me on the following activities:  Ordering and performing treatments and interventions, ordering and review of laboratory studies, ordering and review of radiographic studies, pulse oximetry, re-evaluation of patient's condition, review of old charts, examination of patient, evaluation of patient's response to treatment, development of treatment plan with patient or surrogate and discussions with consultants   I assumed direction of critical care for this patient from another provider in my specialty: no     (including critical care time)  Medications Ordered in ED Medications  vancomycin variable dose per unstable renal function (pharmacist dosing) (has no administration in time range)  ceFEPIme (MAXIPIME) 2 g in sodium chloride 0.9 % 100 mL IVPB (has no administration in time range)  lactated ringers bolus 1,000 mL (1,000 mLs Intravenous Bolus from Bag 01/02/20 1350)    Followed by  lactated ringers infusion (has no administration in time range)  0.9 %  sodium chloride infusion (has no administration in time range)  acetaminophen (TYLENOL) tablet 650 mg (has no administration in time range)    Or  acetaminophen (TYLENOL) suppository 650 mg (has no  administration in time range)  ondansetron (ZOFRAN) tablet 4 mg (has no administration in time range)    Or  ondansetron (ZOFRAN) injection 4 mg (has no administration in time range)  albuterol (PROVENTIL) (2.5 MG/3ML) 0.083% nebulizer solution 2.5 mg (has no administration in time range)  guaiFENesin (MUCINEX) 12 hr tablet 600 mg (has no administration in  time range)  tamsulosin (FLOMAX) capsule 0.4 mg (has no administration in time range)  apixaban (ELIQUIS) tablet 5 mg (has no administration in time range)  lactated ringers bolus 1,000 mL (0 mLs Intravenous Stopped 01/02/20 1030)    And  lactated ringers bolus 1,000 mL (0 mLs Intravenous Stopped 01/02/20 1058)    And  lactated ringers bolus 500 mL (0 mLs Intravenous Stopped 01/02/20 1142)  metroNIDAZOLE (FLAGYL) IVPB 500 mg (0 mg Intravenous Stopped 01/02/20 1055)  vancomycin (VANCOREADY) IVPB 1500 mg/300 mL (0 mg Intravenous Stopped 01/02/20 1304)  ceFEPIme (MAXIPIME) 2 g in sodium chloride 0.9 % 100 mL IVPB (0 g Intravenous Stopped 01/02/20 1351)    ED Course  I have reviewed the triage vital signs and the nursing notes.  Pertinent labs & imaging results that were available during my care of the patient were reviewed by me and considered in my medical decision making (see chart for details).  Clinical Course as of Jan 01 1525  Fri Jan 02, 2020  1004 Patient reassessed.  Continues to deny shortness of breath.  Lungs continue to be clear.   [SJ]  1140 Patient continues to deny shortness of breath or chest pain.  No abdominal tenderness.   [SJ]  1219 Spoke with Jamesetta [place], patient's daughter. She states patient does not have a power of attorney. I described the patient's current mental status and she states that seems typical of her father. I communicated the fact that I think her father is quite sick and that he could get better, however, due to his level of illness, he may not get better. She also states that her father's wishes have always been for full code.   [SJ]  [place] with Eliseo Gum, APP with critical care.  Reviewed the patient's presentation. May consider holding pressors if patient is still mentating well and MAP above 60.   [SJ]  1310 Patient continues to deny shortness of breath or chest pain.  He does, however, now seem to present as more short of breath.  He has  some tachypnea and some coughing.  SPO2 has dropped below 90%, patient placed on supplemental O2.  He does not seem to have rales on lung exam. He also now tells me he was diagnosed with "stage IV genital cancer" about 6 months ago, but states he is not receiving any treatment for it. We discussed CODE STATUS.  Patient firmly states that he would like "everything done."   [SJ]  1331 Spoke with Ms. Bowser and Dr. Ander Slade, critical care. They have assessed the patient and stated they think patient can go to stepdown through the hospitalist. They state he seems to be mentating fine and he is hemodynamically stable. They add that they think he can be given another liter of fluid bolus.   [SJ]  35 Spoke with Dr. Doristine Bosworth, hospitalist. Agrees to come assess the patient for admission.   [SJ]    Clinical Course User Index [SJ] Shirley Decamp, Helane Gunther, PA-C   MDM Rules/Calculators/A&P  Patient presents with reported altered mental status as well as hypotension. I met this patient in the room upon his arrival and was present for the EMS report.  Patient is hypotensive, tachycardic, tachypneic, and hot to the touch.  Sepsis was high on the differential and code sepsis was initiated.  I personally reviewed and interpreted the patient's labs and imaging studies. Leukocytosis present. Elevated creatinine and BUN.  Elevated LFTs, including total bilirubin of 6.1. Possible source of infection in the patient's urine. Patient seemed to maintain his baseline mental status despite his hypotension.  His blood pressure did improve after his 30 cc/kg IV fluid bolus.   PE considered, however, felt less likely due to lack of chest complaints and the fact that patient is already anticoagulated. CT of the abdomen was initially ordered, however, due to the patient's change in breathing status, we decided to wait on this evaluation.  Patient admitted for further management.   Findings and plan of  care discussed with Blanchie Dessert, MD. Dr. Maryan Rued personally evaluated and examined this patient.  Vitals:   01/02/20 0954 01/02/20 0955 01/02/20 0958 01/02/20 1000  BP: (!) 78/58 (!) 84/59  (!) 83/51  Pulse: 100 (!) 103  (!) 102  Resp: (!) 21 (!) 28  (!) 35  Temp:   99.6 F (37.6 C)   TempSrc:   Rectal   SpO2: 93% 94%  95%  Weight:      Height:         Final Clinical Impression(s) / ED Diagnoses Final diagnoses:  Bilirubinemia  AKI (acute kidney injury) ([place])  Sepsis with acute renal failure and septic shock, due to unspecified organism, unspecified acute renal failure type [facility])    Rx / DC Orders ED Discharge Orders    None       Layla Maw 01/02/20 1526    Blanchie Dessert, MD 01/03/20 2015

## 2020-01-02 NOTE — ED Notes (Addendum)
Paged admitting again concerning pt's BP.

## 2020-01-02 NOTE — H&P (Signed)
History and Physical    Mark Bullock HRC:[id number] DOB: 1928-09-05 DOA: 01/02/2020  PCP: Burton Apley, MD  Patient coming from: [facility]   I have personally briefly reviewed patient's old medical records in [facility]  Chief Complaint: AMS  HPI: Mark Bullock is a 86 y.o. male with medical history significant of HTN, HLD, BPH, CVA, CKD, chronic combined systolic and diastolic CHF with ejection fraction of 35 to 40%, DVT/PE-on Eliquis, physical deconditioning, chronic bilateral lower extremity edema presented with altered mental status since 12 hours.  Patient tells me that he was nauseous and has poor appetite and generalized weakness and fatigue and diarrhea for 2 days.  Reports intermittent productive cough with clear mucus however denies shortness of breath, chest pain, palpitation, headache, blurry vision, fever, chills, abdominal pain, or urinary symptoms.  As per facility staff patient was confused since 12 hours-EMS was called and reported patient was warm to touch, BP: 78/50, heart rate: 110, regular, respiratory rate: 38, CPT: 91, patient was given 250 cc of normal saline in route to ED.  No history of smoking, alcohol, illicit drug use.  ED Course: Upon arrival to ED: Patient tachycardic, tachypneic, hypotensive, lactic acid: 6.1, UA positive for leukocytes, bacteria and nitrites.  WBC: 25,000, platelet: 110, CMP shows AKI on CKD, AST: 100, ALT: 130, elevated total bilirubin of 6.1, anion gap: 18.  Chest x-ray: No acute cardiopulmonary abnormality, chronic asymmetric elevation of right hemidiaphragm.  Right upper quadrant ultrasound shows mildly prominent CBD.  No definitive ductal stone is seen.  Right renal cyst.  Patient started on IV fluid bolus along with broad-spectrum antibiotics cefepime and vancomycin.  PCCM was consulted-recommended pt is stable to go to stepdown.  [facility] hospitalist consulted for admission for sepsis.   Review of Systems: As per  HPI otherwise negative.    Past Medical History:  Diagnosis Date  . AKI (acute kidney injury) (HCC) 05/18/2017  . Diabetes mellitus without complication (HCC)   . Hyperlipidemia   . Hypertension   . Left-sided weakness   . Stroke [facility])     Past Surgical History:  Procedure Laterality Date  . APPENDECTOMY    . KNEE SURGERY       reports that he has never smoked. He has never used smokeless tobacco. He reports that he does not drink alcohol and does not use drugs.  No Known Allergies  Family History  Family history unknown: Yes    Prior to Admission medications   Medication Sig Start Date End Date Taking? Authorizing Provider  apixaban (ELIQUIS) 5 MG TABS tablet Take 5 mg by mouth 2 (two) times daily.   Yes [provider]  cetirizine (ZYRTEC) 10 MG tablet Take 10 mg by mouth daily as needed for allergies.   Yes [provider]  furosemide (LASIX) 40 MG tablet Take 0.5 tablets (20 mg total) by mouth daily. 04/01/19  Yes Dhungel, Nishant, MD  polyethylene glycol (MIRALAX / GLYCOLAX) 17 g packet Take 17 g by mouth daily.   Yes [provider]  tamsulosin (FLOMAX) 0.4 MG CAPS capsule Take 1 capsule (0.4 mg total) by mouth at bedtime. 09/25/19  Yes Briant Cedar, MD  feeding supplement, ENSURE ENLIVE, (ENSURE ENLIVE) LIQD Take 237 mLs by mouth 2 (two) times daily between meals. 04/01/19   Dhungel, Nishant, MD  hydrocerin (EUCERIN) CREA Apply 1 application topically 2 (two) times daily. Bilateral lower legs Patient not taking: Reported on 09/20/2019 04/01/19   Eddie North, MD  lisinopril (ZESTRIL) 5 MG tablet Take 0.5 tablets (2.5 mg total) by mouth daily. Patient not taking: Reported on 01/02/2020 04/01/19   Louellen Molder, MD    Physical Exam: Vitals:   01/02/20 1341 01/02/20 1351 01/02/20 1405 01/02/20 1410  BP: (!) 145/55 116/78 105/68 116/65  Pulse: (!) 121 (!) 114 (!) 110 (!) 109  Resp: (!) 37 (!) 32 (!) 34 (!) 28  Temp:      TempSrc:       SpO2: 93% 92% 95% 94%  Weight:      Height:        Constitutional: NAD, calm, comfortable, on 4 L of oxygen via nasal cannula, intermittently coughing. alert and oriented x3 Eyes: PERRL, lids and conjunctivae normal ENMT: Mucous membranes are moist. Posterior pharynx clear of any exudate or lesions.Normal dentition.  Neck: normal, supple, no masses, no thyromegaly Respiratory: Tachypneic, no crackles or rales.  Cardiovascular: Tachycardic, no murmurs / rubs / gallops.  Chronic bilateral lower extremity edema positive, nonpitting 2+ pedal pulses. No carotid bruits.  Abdomen: no tenderness, no masses palpated. No hepatosplenomegaly. Bowel sounds positive.  Musculoskeletal: no clubbing / cyanosis. No joint deformity upper and lower extremities.   Skin: Bilateral lower extremity chronic venous stasis noted  neurologic: CN 2-12 grossly intact. Sensation intact, DTR normal. Strength 5/5 in all 4.  Tremors +ve    Labs on Admission: I have personally reviewed following labs and imaging studies  CBC: Recent Labs  Lab 01/02/20 0951 01/02/20 1047  WBC  --  25.2*  NEUTROABS  --  23.4*  HGB 13.3 12.4*  HCT 39.0 38.1*  MCV  --  86.4  PLT  --  784*   Basic Metabolic Panel: Recent Labs  Lab 01/02/20 0951 01/02/20 1047  NA 138 137  K 4.1 4.1  CL  --  100  CO2  --  19*  GLUCOSE  --  75  BUN  --  38*  CREATININE  --  3.32*  CALCIUM  --  8.4*   GFR: Estimated Creatinine Clearance: 15.2 mL/min (A) (by C-G formula based on SCr of 3.32 mg/dL (H)). Liver Function Tests: Recent Labs  Lab 01/02/20 1047  AST 100*  ALT 130*  ALKPHOS 210*  BILITOT 6.1*  PROT 6.2*  ALBUMIN 2.6*   No results for input(s): LIPASE, AMYLASE in the last 168 hours. Recent Labs  Lab 01/02/20 1339  AMMONIA 34   Coagulation Profile: Recent Labs  Lab 01/02/20 1047  INR 2.9*   Cardiac Enzymes: No results for input(s): CKTOTAL, CKMB, CKMBINDEX, TROPONINI in the last 168 hours. BNP (last 3  results) No results for input(s): PROBNP in the last 8760 hours. HbA1C: No results for input(s): HGBA1C in the last 72 hours. CBG: Recent Labs  Lab 01/02/20 0936  GLUCAP 81   Lipid Profile: No results for input(s): CHOL, HDL, LDLCALC, TRIG, CHOLHDL, LDLDIRECT in the last 72 hours. Thyroid Function Tests: No results for input(s): TSH, T4TOTAL, FREET4, T3FREE, THYROIDAB in the last 72 hours. Anemia Panel: No results for input(s): VITAMINB12, FOLATE, FERRITIN, TIBC, IRON, RETICCTPCT in the last 72 hours. Urine analysis:    Component Value Date/Time   COLORURINE AMBER (A) 01/02/2020 1005   APPEARANCEUR TURBID (A) 01/02/2020 1005   LABSPEC 1.019 01/02/2020 1005   PHURINE 5.0 01/02/2020 1005   GLUCOSEU NEGATIVE 01/02/2020 1005   HGBUR MODERATE (A) 01/02/2020 1005   BILIRUBINUR NEGATIVE 01/02/2020 1005   KETONESUR NEGATIVE 01/02/2020 1005   PROTEINUR 100 (A) 01/02/2020 1005  UROBILINOGEN 1.0 01/16/2010 0200   NITRITE NEGATIVE 01/02/2020 1005   LEUKOCYTESUR SMALL (A) 01/02/2020 1005    Radiological Exams on Admission: DG Chest Port 1 View  Result Date: 01/02/2020 CLINICAL DATA:  tachycardia and possible sepsis. EXAM: PORTABLE CHEST 1 VIEW COMPARISON:  09/19/2019 . FINDINGS: Chronic asymmetric elevation of the right hemidiaphragm. Heart size normal. No pleural effusion or edema identified. No acute osseous abnormality. IMPRESSION: 1. No acute cardiopulmonary abnormalities. 2. Chronic asymmetric elevation of the right hemidiaphragm. Electronically Signed   By: Signa Kell M.D.   On: 01/02/2020 11:19   US Abdomen Limited RUQ  Result Date: 01/02/2020 CLINICAL DATA:  Elevated bilirubin EXAM: ULTRASOUND ABDOMEN LIMITED RIGHT UPPER QUADRANT COMPARISON:  None. FINDINGS: Gallbladder: Gallbladder is not well visualized. Common bile duct: Diameter: 10 mm which is slightly enlarged for the patient's given age. Liver: No focal lesion identified. Within normal limits in parenchymal  echogenicity. Portal vein is patent on color Doppler imaging with normal direction of blood flow towards the liver. Other: Incidental note is made of right renal cyst measuring 5 cm IMPRESSION: Nonvisualization of the gallbladder. This may be due to decompression. Mildly prominent common bile duct. No definitive ductal stone is seen. Right renal cyst. Electronically Signed   By: Alcide Clever M.D.   On: 01/02/2020 12:58    EKG: Independently reviewed.  Sinus tachycardia, multiple ventricular premature complexes, right bundle branch block.  Assessment/Plan Principal Problem:   Sepsis (HCC) Active Problems:   Bilateral lower extremity edema   Physical deconditioning   AKI (acute kidney injury) (HCC)   Essential hypertension, benign   Dyslipidemia associated with type 2 diabetes mellitus (HCC)   Chronic systolic CHF (congestive heart failure) (HCC)   High anion gap metabolic acidosis   Elevated liver enzymes   Total bilirubin, elevated   Thrombocytopenia (HCC)   Acute hypoxemic respiratory failure (HCC)   Acute lower UTI   DVT (deep venous thrombosis) (HCC)   Sepsis with multiorgan failure: -2/2 UTI -Patient presented with tachycardia, tachypnea, hypotension, leukocytosis of 25,000, lactic acid: 6.1, AKI on CKD, elevated liver enzymes. -Chest x-ray negative for pneumonia.  UA is positive for leukocyte, RBC, WBC and bacteria.  Right upper quadrant ultrasound reviewed.  COVID-19 negative -Received IV fluid boluses in ED.  Cefepime and vancomycin -Admit patient to stepdown unit for close monitoring. -Continue IV fluids and broad-spectrum antibiotic.  Urine culture and blood culture and procalcitonin level is ordered and is pending. -Monitor vital signs closely.  AKI on CKD stage IIIa: -Continue IV fluid.  Avoid nephrotoxic medication. -Repeat CMP tomorrow a.m.  Elevated liver enzymes: -AST: 100, ALT: 130, alkaline phosphatase: 210 -Reviewed right upper quadrant ultrasound -We will  check acute hepatitis panel, ammonia level: WNL -Avoid hepatotoxic medication. -Repeat CMP tomorrow a.m.   Elevated total bilirubin: 6.1 -PT/INR: 29.6/2.95 -APTT: 48 -Continue to monitor.  Thrombocytopenia: Platelet: 110 -Monitor closely for bleeding  Anion gap metabolic acidosis: -Anion gap: 18.  Likely secondary to worsening kidney function.  Hypertension: Currently blood pressure is on lower side.  Hold blood pressure medicine at this time.  BPH: Continue Flomax  History of DVT/PE: Continue Eliquis  Chronic combined systolic and diastolic CHF: Stable -With ejection fraction of 35 to 40%.  Strict INO's and daily weight.  Monitor electrolytes closely.  Hold Lasix for now due to AKI and hypotension.  Monitor signs for fluid overload. -Echo and BNP is pending  Please note: Patient's blood pressure noted to be 78/52 with map of 61.  I  called PCCM-patient will be transferred to ICU for further management of sepsis.  DVT prophylaxis: Eliquis/SCD Code Status: Full code-confirmed with the patient Family Communication: None present at bedside.  Plan of care discussed with patient in length and he verbalized understanding and agreed with it. Disposition Plan: To be determined Consults called: PCCM by EDP Admission status: Inpatient   Ollen Bowl MD [facility]  If 7PM-7AM, please contact night-coverage [email or web address] Password [facility]  01/02/2020, 2:29 PM

## 2020-01-02 NOTE — Progress Notes (Addendum)
Pharmacy Antibiotic Note  Mark Bullock is a 86 y.o. male admitted on 01/02/2020 from SNF with AMS, concern for sepsis.  Pharmacy has been consulted for vancomycin and cefepime dosing.  Plan: Vancomycin 1500 mg IV x 1, then variable dosing due to unstable renal function Cefepime 2g IV every 24 hours Monitor renal function, Cx and clinical progression to narrow Vancomycin level as needed  Height: 5\' 7"  (170.2 cm) Weight: 79.4 kg (175 lb) IBW/kg (Calculated) : 66.1  Temp (24hrs), Avg:97.4 F (36.3 C), Min:97.4 F (36.3 C), Max:97.4 F (36.3 C)  No results for input(s): WBC, CREATININE, LATICACIDVEN, VANCOTROUGH, VANCOPEAK, VANCORANDOM, GENTTROUGH, GENTPEAK, GENTRANDOM, TOBRATROUGH, TOBRAPEAK, TOBRARND, AMIKACINPEAK, AMIKACINTROU, AMIKACIN in the last 168 hours.  CrCl cannot be calculated (Patient's most recent lab result is older than the maximum 21 days allowed.).    Not on File  Antimicrobials this admission: Vanc 6/25>> Cefepime 6/25>>  Dose adjustments this admission: n/a  Microbiology results: 6/25 BCx: sent 6/25 UCx: sent   7/25, PharmD Clinical Pharmacist ED Pharmacist Phone # [phone] 01/02/2020 10:59 AM

## 2020-01-02 NOTE — Progress Notes (Signed)
eLink Physician-Brief Progress Note Patient Name: Mark Bullock DOB: 1929-02-17 MRN: [id number]   Date of Service  01/02/2020  HPI/Events of Note  BP = 73/49 with MAP = 57. Currently on a Norepinephrine IV infusion via a peripheral IV.Will need CVL.  eICU Interventions  Will notify ground team of clinical situation.      Intervention Category Major Interventions: Hypotension - evaluation and management  Lenell Antu 01/02/2020, 7:38 PM

## 2020-01-02 NOTE — ED Triage Notes (Signed)
Pt bib ems from [facility] with reports of AMS X12 hours per facility. Ems reports pt warm to the touch. BP 78/50, HR 110 and irregular, RR 38, ETCo2 23, CBG 91. Pt given 250cc NS en route. Staff also reports some diarrhea X2 days.

## 2020-01-02 NOTE — Procedures (Signed)
Central Venous Catheter Insertion Procedure Note  Mark Bullock  [id number]  06-03-1929  Date:01/02/20  Time:9:30 PM    Provider Performing: [name] Meta Kroenke    Procedure: Insertion of Arterial Line (02111) with US guidance (55208)   Indication(s) Blood pressure monitoring and/or need for frequent ABGs  Consent Risks of the procedure as well as the alternatives and risks of each were explained to the patient and/or caregiver.  Consent for the procedure was obtained and is signed in the bedside chart  Anesthesia None   Time Out Verified patient identification, verified procedure, site/side was marked, verified correct patient position, special equipment/implants available, medications/allergies/relevant history reviewed, required imaging and test results available.   Sterile Technique Maximal sterile technique including full sterile barrier drape, hand hygiene, sterile gown, sterile gloves, mask, hair covering, sterile ultrasound probe cover (if used).   Procedure Description Area of catheter insertion was cleaned with chlorhexidine and draped in sterile fashion. With real-time ultrasound guidance an arterial catheter was placed into the right femoral artery.  Appropriate arterial tracings confirmed on monitor.     Complications/Tolerance None; patient tolerated the procedure well.   EBL Minimal   Specimen(s) None   [name] Hollace Michelli, PA-C

## 2020-01-02 NOTE — ED Notes (Signed)
Had secretary page admitting concerning pt's BP.

## 2020-01-03 ENCOUNTER — Inpatient Hospital Stay (HOSPITAL_COMMUNITY): Payer: Medicare Other

## 2020-01-03 DIAGNOSIS — A419 Sepsis, unspecified organism: Secondary | ICD-10-CM

## 2020-01-03 DIAGNOSIS — I361 Nonrheumatic tricuspid (valve) insufficiency: Secondary | ICD-10-CM

## 2020-01-03 DIAGNOSIS — R6521 Severe sepsis with septic shock: Secondary | ICD-10-CM

## 2020-01-03 DIAGNOSIS — I340 Nonrheumatic mitral (valve) insufficiency: Secondary | ICD-10-CM

## 2020-01-03 LAB — BLOOD CULTURE ID PANEL (REFLEXED)

## 2020-01-03 LAB — PROTIME-INR
INR: 2.1 — ABNORMAL HIGH (ref 0.8–1.2)
Prothrombin Time: 22.9 seconds — ABNORMAL HIGH (ref 11.4–15.2)

## 2020-01-03 LAB — CBC
HCT: 34.2 % — ABNORMAL LOW (ref 39.0–52.0)
Hemoglobin: 11.5 g/dL — ABNORMAL LOW (ref 13.0–17.0)
MCH: 28.3 pg (ref 26.0–34.0)
MCHC: 33.6 g/dL (ref 30.0–36.0)
MCV: 84.2 fL (ref 80.0–100.0)
Platelets: 53 10*3/uL — ABNORMAL LOW (ref 150–400)
RBC: 4.06 MIL/uL — ABNORMAL LOW (ref 4.22–5.81)
RDW: 15.6 % — ABNORMAL HIGH (ref 11.5–15.5)
WBC: 30 10*3/uL — ABNORMAL HIGH (ref 4.0–10.5)
nRBC: 0 % (ref 0.0–0.2)

## 2020-01-03 LAB — GLUCOSE, CAPILLARY
Glucose-Capillary: 130 mg/dL — ABNORMAL HIGH (ref 70–99)
Glucose-Capillary: 145 mg/dL — ABNORMAL HIGH (ref 70–99)
Glucose-Capillary: 169 mg/dL — ABNORMAL HIGH (ref 70–99)
Glucose-Capillary: 83 mg/dL (ref 70–99)
Glucose-Capillary: 84 mg/dL (ref 70–99)
Glucose-Capillary: 91 mg/dL (ref 70–99)
Glucose-Capillary: 99 mg/dL (ref 70–99)

## 2020-01-03 LAB — COMPREHENSIVE METABOLIC PANEL
ALT: 89 U/L — ABNORMAL HIGH (ref 0–44)
AST: 59 U/L — ABNORMAL HIGH (ref 15–41)
Albumin: 2.3 g/dL — ABNORMAL LOW (ref 3.5–5.0)
Alkaline Phosphatase: 158 U/L — ABNORMAL HIGH (ref 38–126)
Anion gap: 15 (ref 5–15)
BUN: 48 mg/dL — ABNORMAL HIGH (ref 8–23)
CO2: 17 mmol/L — ABNORMAL LOW (ref 22–32)
Calcium: 8 mg/dL — ABNORMAL LOW (ref 8.9–10.3)
Chloride: 103 mmol/L (ref 98–111)
Creatinine, Ser: 3.71 mg/dL — ABNORMAL HIGH (ref 0.61–1.24)
GFR calc Af Amer: 16 mL/min — ABNORMAL LOW (ref >60)
GFR calc non Af Amer: 14 mL/min — ABNORMAL LOW (ref >60)
Glucose, Bld: 96 mg/dL (ref 70–99)
Potassium: 4.1 mmol/L (ref 3.5–5.1)
Sodium: 135 mmol/L (ref 135–145)
Total Bilirubin: 7 mg/dL — ABNORMAL HIGH (ref 0.3–1.2)
Total Protein: 5.7 g/dL — ABNORMAL LOW (ref 6.5–8.1)

## 2020-01-03 LAB — ECHOCARDIOGRAM LIMITED
Height: 67 in
Weight: 2800 oz

## 2020-01-03 LAB — SODIUM, URINE, RANDOM: Sodium, Ur: 20 mmol/L

## 2020-01-03 LAB — CORTISOL-AM, BLOOD: Cortisol - AM: 37.9 ug/dL — ABNORMAL HIGH (ref 6.7–22.6)

## 2020-01-03 LAB — CREATININE, URINE, RANDOM: Creatinine, Urine: 67.02 mg/dL

## 2020-01-03 MED ORDER — ORAL CARE MOUTH RINSE
15.0000 mL | Freq: Two times a day (BID) | OROMUCOSAL | Status: DC
  Administered 2020-01-03 – 2020-01-08 (×12): 15 mL via OROMUCOSAL

## 2020-01-03 NOTE — Progress Notes (Addendum)
NAME:  Mark Bullock, MRN:  [id number], DOB:  Sep 02, 1928, LOS: 1 ADMISSION DATE:  01/02/2020, CONSULTATION DATE:  01/02/20 REFERRING MD:  Anitra Lauth- EM , CHIEF COMPLAINT:  AMS, hypotension   Brief History   86 yo M, admitted 6/25 from nursing facility with AMS. Very poor PO intake x several days per patient, baseline high stool output. Hypotensive in ED with concern for sepsis, received 81ml/kg IVF.     Past Medical History  CVA PE / DVT  Systolic heart failure CKD DVT HTN Dm2 HLD  Significant Hospital Events   6/25 presents to ED, volume resuscitated. Evaluated by PCCM   Consults:  PCCM   Procedures:    Significant Diagnostic Tests:  RUS [place] 6/25 > mildly prominent common bile duct without definitive stone. Incidental finding of R renal cyst  CXR 6/25 > chronic asymmetric R hemidiaphragm elevation. No acute abnormality   Micro Data:  COVID 6/25 >> negative  BCx2 6/25 >> GNR >> UC 6/25 >> greater than 100k colonies GNR >>  Antimicrobials:  Vanco 6/25 >> 6/26 Cefepime 6/25 >>  Flagyl 6/25 x1 Azithro 6/25 x1   Interim history/subjective:  RN reports positional Aline, levophed weaned to On 4L [place] Tmax 101.6 I/O - no UOP recorded, 6L+ in last 24 hours  Objective   Blood pressure 104/76, pulse 81, temperature 98.9 F (37.2 C), temperature source Oral, resp. rate (!) 21, height 5\' 7"  (1.702 m), weight 79.4 kg, SpO2 97 %.        Intake/Output Summary (Last 24 hours) at 01/03/2020 0759 Last data filed at 01/03/2020 0600 Gross per 24 hour  Intake 6080.5 ml  Output --  Net 6080.5 ml   Filed Weights   01/02/20 0943  Weight: 79.4 kg    Examination: General: elderly male sitting up in bed in NAD HEENT: MM pink/moist, [place] O2, edentulous  Neuro: AAOx4, difficult to understand speech due to lack of teeth but oriented to self, place, MAE CV: s1s2 rrr with frequent PVC's, no m/r/g PULM: non-labored on  O2, lungs bilaterally clear GI: soft, bsx4 active   Extremities: warm/dry, trace to 1+ BLE edema, SCD's in place Skin: no rashes or lesions  Resolved Hospital Problem list    Assessment & Plan:   GNR Septic Shock BCID with klebsiella.  -follow UC, blood cultures to maturity -continue cefepime for now, D2/x abx -wean levophed for MAP >65 -consider removal of aline once off vasopressors -hold home antihypertensives   Abdominal Distension with Hyperbilirubinemia and Mild Transaminitis RUQ 01/04/20 with prominent CBD.  Suspect component of shock as well.  -follow abdominal exam, LFT's   Acute Metabolic Encephalopathy  Hx CVA Suspect multifactorial encephalopathy in setting of baseline CVA (does not appear to have residual deficits), volume depletion, sepsis -correct underlying metabolic abnormalities  -avoid sedating medications  AKI on CKD III AGMA Lactic Acidosis  In setting of shock / hypovolemia with poor PO intake, acute infection.  Incidental right renal cyst on RUQ [place]  -Trend BMP / urinary output -Replace electrolytes as indicated -Avoid nephrotoxic agents, ensure adequate renal perfusion -assess FeNa   Anemia  Thrombocytopenia  Suspect in setting of sepsis -follow CBC  Faint Expiratory Wheeze (6/25), resolved -PRN albuterol   Chronic Systolic Heart Failure, LVEF 35-40% 09/2019 ? Hx of Afib   Hx HTN, PE/ DVT  -tele monitoring  -resume home apixaban pm 6/27 if platelets recovered  -renal dose medications  -reduce IVF to 72ml/hr   DM II -follow glucose on BMP,  not on medications at home   BPH -continue home flomax    -condom catheter   Best practice:  Diet: Recommend NPO with ongoing IVF  Family Communication: per primary Disposition: Stable for admission for [facility] outside of ICU   Labs   CBC: Recent Labs  Lab 01/02/20 0951 01/02/20 1047 01/02/20 1557 01/03/20 0404  WBC  --  25.2* 20.7* 30.0*  NEUTROABS  --  23.4*  --   --   HGB 13.3 12.4* 10.9* 11.5*  HCT 39.0 38.1* 33.9* 34.2*  MCV  --  86.4 86.7  84.2  PLT  --  110* 93* 53*    Basic Metabolic Panel: Recent Labs  Lab 01/02/20 0951 01/02/20 1047 01/02/20 1425 01/02/20 1557 01/03/20 0404  NA 138 137  --   --  135  K 4.1 4.1  --   --  4.1  CL  --  100  --   --  103  CO2  --  19*  --   --  17*  GLUCOSE  --  75  --   --  96  BUN  --  38*  --   --  48*  CREATININE  --  3.32*  --  3.22* 3.71*  CALCIUM  --  8.4*  --   --  8.0*  MG  --   --  1.2*  --   --   PHOS  --   --  2.8  --   --    GFR: Estimated Creatinine Clearance: 13.6 mL/min (A) (by C-G formula based on SCr of 3.71 mg/dL (H)). Recent Labs  Lab 01/02/20 1011 01/02/20 1047 01/02/20 1340 01/02/20 1425 01/02/20 1557 01/03/20 0404  PROCALCITON  --   --   --  >150.00  --   --   WBC  --  25.2*  --   --  20.7* 30.0*  LATICACIDVEN 6.1*  --  3.5*  --   --   --     Liver Function Tests: Recent Labs  Lab 01/02/20 1047 01/03/20 0404  AST 100* 59*  ALT 130* 89*  ALKPHOS 210* 158*  BILITOT 6.1* 7.0*  PROT 6.2* 5.7*  ALBUMIN 2.6* 2.3*   Recent Labs  Lab 01/02/20 1557  LIPASE 18  AMYLASE 175*   Recent Labs  Lab 01/02/20 1339  AMMONIA 34    ABG    Component Value Date/Time   HCO3 20.4 01/02/2020 0951   TCO2 21 (L) 01/02/2020 0951   ACIDBASEDEF 4.0 (H) 01/02/2020 0951   O2SAT 69.0 01/02/2020 0951     Coagulation Profile: Recent Labs  Lab 01/02/20 1047 01/03/20 0404  INR 2.9* 2.1*    Cardiac Enzymes: No results for input(s): CKTOTAL, CKMB, CKMBINDEX, TROPONINI in the last 168 hours.  HbA1C: Hgb A1c MFr Bld  Date/Time Value Ref Range Status  09/21/2019 05:57 AM 5.3 4.8 - 5.6 % Final    Comment:    (NOTE) Pre diabetes:          5.7%-6.4% Diabetes:              >6.4% Glycemic control for   <7.0% adults with diabetes   03/29/2019 04:57 AM 5.5 4.8 - 5.6 % Final    Comment:    (NOTE) Pre diabetes:          5.7%-6.4% Diabetes:              >6.4% Glycemic control for   <7.0% adults with diabetes     CBG:  Recent Labs  Lab  01/02/20 0936 01/02/20 1629 01/03/20 0220 01/03/20 0404 01/03/20 0730  GLUCAP 81 63* 83 84 91     CC Time: 35 minutes    Canary Brim, MSN, NP-C [facility] 01/03/2020, 8:00 AM   Please see [email or web address] for pager details.

## 2020-01-03 NOTE — Progress Notes (Signed)
  Echocardiogram 2D Echocardiogram has been performed.  Mark Bullock 01/03/2020, 3:23 PM

## 2020-01-03 NOTE — Progress Notes (Addendum)
PHARMACY - PHYSICIAN COMMUNICATION CRITICAL VALUE ALERT - BLOOD CULTURE IDENTIFICATION (BCID)  Mark Bullock is an 86 y.o. male who presented to [facility] on 01/02/2020 with a chief complaint of AMS and hypotension.  Assessment:  Started on broad-spectrum ABX for sepsis, now w/ 4 of 4 bottles growing Klebsiella pneumoniae.  Name of physician (or Provider) Contacted: SSommer  Current antibiotics: vanc, cefepime, azithro  Changes to prescribed antibiotics recommended:  Could consider narrowing to Rocephin when pt is more stable; for now will maintain current ABX, covered w/ cefepime.  Results for orders placed or performed during the hospital encounter of 01/02/20  Blood Culture ID Panel (Reflexed) (Collected: 01/02/2020  9:37 AM)  Result Value Ref Range   Enterococcus species NOT DETECTED NOT DETECTED   Listeria monocytogenes NOT DETECTED NOT DETECTED   Staphylococcus species NOT DETECTED NOT DETECTED   Staphylococcus aureus (BCID) NOT DETECTED NOT DETECTED   Streptococcus species NOT DETECTED NOT DETECTED   Streptococcus agalactiae NOT DETECTED NOT DETECTED   Streptococcus pneumoniae NOT DETECTED NOT DETECTED   Streptococcus pyogenes NOT DETECTED NOT DETECTED   Acinetobacter baumannii NOT DETECTED NOT DETECTED   Enterobacteriaceae species DETECTED (A) NOT DETECTED   Enterobacter cloacae complex NOT DETECTED NOT DETECTED   Escherichia coli NOT DETECTED NOT DETECTED   Klebsiella oxytoca NOT DETECTED NOT DETECTED   Klebsiella pneumoniae DETECTED (A) NOT DETECTED   Proteus species NOT DETECTED NOT DETECTED   Serratia marcescens NOT DETECTED NOT DETECTED   Carbapenem resistance NOT DETECTED NOT DETECTED   Haemophilus influenzae NOT DETECTED NOT DETECTED   Neisseria meningitidis NOT DETECTED NOT DETECTED   Pseudomonas aeruginosa NOT DETECTED NOT DETECTED   Candida albicans NOT DETECTED NOT DETECTED   Candida glabrata NOT DETECTED NOT DETECTED   Candida krusei NOT DETECTED NOT  DETECTED   Candida parapsilosis NOT DETECTED NOT DETECTED   Candida tropicalis NOT DETECTED NOT DETECTED    Vernard Gambles, PharmD, BCPS  01/03/2020  4:56 AM

## 2020-01-04 ENCOUNTER — Encounter (HOSPITAL_COMMUNITY): Payer: Self-pay | Admitting: Pulmonary Disease

## 2020-01-04 ENCOUNTER — Inpatient Hospital Stay (HOSPITAL_COMMUNITY): Payer: Medicare Other

## 2020-01-04 DIAGNOSIS — Z7189 Other specified counseling: Secondary | ICD-10-CM

## 2020-01-04 DIAGNOSIS — Z515 Encounter for palliative care: Secondary | ICD-10-CM

## 2020-01-04 LAB — COMPREHENSIVE METABOLIC PANEL
ALT: 52 U/L — ABNORMAL HIGH (ref 0–44)
AST: 32 U/L (ref 15–41)
Albumin: 1.9 g/dL — ABNORMAL LOW (ref 3.5–5.0)
Alkaline Phosphatase: 121 U/L (ref 38–126)
Anion gap: 10 (ref 5–15)
BUN: 55 mg/dL — ABNORMAL HIGH (ref 8–23)
CO2: 22 mmol/L (ref 22–32)
Calcium: 8 mg/dL — ABNORMAL LOW (ref 8.9–10.3)
Chloride: 104 mmol/L (ref 98–111)
Creatinine, Ser: 3.61 mg/dL — ABNORMAL HIGH (ref 0.61–1.24)
GFR calc Af Amer: 16 mL/min — ABNORMAL LOW (ref >60)
GFR calc non Af Amer: 14 mL/min — ABNORMAL LOW (ref >60)
Glucose, Bld: 116 mg/dL — ABNORMAL HIGH (ref 70–99)
Potassium: 4 mmol/L (ref 3.5–5.1)
Sodium: 136 mmol/L (ref 135–145)
Total Bilirubin: 4.7 mg/dL — ABNORMAL HIGH (ref 0.3–1.2)
Total Protein: 5.1 g/dL — ABNORMAL LOW (ref 6.5–8.1)

## 2020-01-04 LAB — URINE CULTURE: Culture: >=100000 — AB

## 2020-01-04 LAB — CBC
HCT: 31.7 % — ABNORMAL LOW (ref 39.0–52.0)
Hemoglobin: 11 g/dL — ABNORMAL LOW (ref 13.0–17.0)
MCH: 28.4 pg (ref 26.0–34.0)
MCHC: 34.7 g/dL (ref 30.0–36.0)
MCV: 81.7 fL (ref 80.0–100.0)
Platelets: 39 10*3/uL — ABNORMAL LOW (ref 150–400)
RBC: 3.88 MIL/uL — ABNORMAL LOW (ref 4.22–5.81)
RDW: 15.6 % — ABNORMAL HIGH (ref 11.5–15.5)
WBC: 20.4 10*3/uL — ABNORMAL HIGH (ref 4.0–10.5)
nRBC: 0 % (ref 0.0–0.2)

## 2020-01-04 LAB — GLUCOSE, CAPILLARY
Glucose-Capillary: 108 mg/dL — ABNORMAL HIGH (ref 70–99)
Glucose-Capillary: 110 mg/dL — ABNORMAL HIGH (ref 70–99)
Glucose-Capillary: 112 mg/dL — ABNORMAL HIGH (ref 70–99)
Glucose-Capillary: 117 mg/dL — ABNORMAL HIGH (ref 70–99)
Glucose-Capillary: 97 mg/dL (ref 70–99)
Glucose-Capillary: 99 mg/dL (ref 70–99)

## 2020-01-04 LAB — DIC (DISSEMINATED INTRAVASCULAR COAGULATION)PANEL
D-Dimer, Quant: 4.61 ug/mL-FEU — ABNORMAL HIGH (ref 0.00–0.50)
Fibrinogen: 693 mg/dL — ABNORMAL HIGH (ref 210–475)
INR: 1.5 — ABNORMAL HIGH (ref 0.8–1.2)
Platelets: 34 10*3/uL — ABNORMAL LOW (ref 150–400)
Prothrombin Time: 17.9 seconds — ABNORMAL HIGH (ref 11.4–15.2)
Smear Review: NONE SEEN
aPTT: 41 seconds — ABNORMAL HIGH (ref 24–36)

## 2020-01-04 MED ORDER — MIDODRINE HCL 5 MG PO TABS
5.0000 mg | ORAL_TABLET | Freq: Three times a day (TID) | ORAL | Status: DC
  Administered 2020-01-04 – 2020-01-08 (×13): 5 mg via ORAL
  Filled 2020-01-04 (×13): qty 1
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
## 2020-01-04 NOTE — Progress Notes (Signed)
NAME:  Mark Bullock, MRN:  [id number], DOB:  07-20-1928, LOS: 2 ADMISSION DATE:  01/02/2020, CONSULTATION DATE:  01/02/20 REFERRING MD:  Maryan Rued- EM , CHIEF COMPLAINT:  AMS, hypotension   Brief History   86 yo M, admitted 6/25 from nursing facility with AMS. Very poor PO intake x several days per patient, baseline high stool output. Hypotensive in ED with concern for sepsis, received 12ml/kg IVF.     Past Medical History  CVA PE / DVT  Systolic heart failure CKD DVT HTN Dm2 HLD  Significant Hospital Events   6/25 presents to ED, volume resuscitated. Evaluated by PCCM   Consults:  PCCM   Procedures:    Significant Diagnostic Tests:  RUS [place] 6/25 > mildly prominent common bile duct without definitive stone. Incidental finding of R renal cyst  CXR 6/25 > chronic asymmetric R hemidiaphragm elevation. No acute abnormality  Renal US- could not see left kidney, right kidney no obvious pyelo  Micro Data:  COVID 6/25 >> negative  BCx2 6/25 >>Klebsiella UC 6/25 >> greater than 100k colonies GNR >>E coli  Antimicrobials:   Cefepime 6/25 >>    Interim history/subjective:  No events. Still on low dose levophed. Echo with severely reduced biventricular function.  Objective   Blood pressure 96/66, pulse 76, temperature 98 F (36.7 C), temperature source Oral, resp. rate (!) 23, height 5\' 7"  (1.702 m), weight 83.9 kg, SpO2 96 %.        Intake/Output Summary (Last 24 hours) at 01/04/2020 1000 Last data filed at 01/04/2020 0900 Gross per 24 hour  Intake 1631.49 ml  Output 945 ml  Net 686.49 ml   Filed Weights   01/02/20 0943 01/04/20 0358  Weight: 79.4 kg 83.9 kg    Examination: GEN: frail thin man in NAD HEENT: temporal wasting, trachea midline CV: RRR, ext warm PULM: worsened wheezing today and crackles at base, no accessory muscle use GI: Soft, +BS EXT: Trace edema, +muscle wasting  NEURO: Moves all 4 ext to command PSYCH: RASS 0, pleasantly confused,  difficult to understand SKIN: No rashes  Uop sluggish Cr stable  Resolved Hospital Problem list    Assessment & Plan:   GNR Septic Shock Urine E coli Blood Klebsiella - Follow up sensitivities - Would favor a longer course of antibiotics- 10 days  Abdominal Distension with Hyperbilirubinemia and Mild Transaminitis - Improved - Likely shock liver  Acute Metabolic Encephalopathy  Hx CVA Suspect dementia - Re orient as able - Avoid benzodiazepines  AKI on CKD III- FeNa c/w pre renal, I am suspecting some cardiorenal given echo findings AGMA Lactic Acidosis  -Trend BMP / urinary output -Replace electrolytes as indicated -Avoid nephrotoxic agents, ensure adequate renal perfusion  Anemia  Thrombocytopenia  Suspect in setting of sepsis -follow CBC -Check DIC panel  Faint Expiratory Wheeze (6/25), resolved -PRN albuterol   Acute on Chronic Systolic Heart Failure, LVEF 35-40% 09/2019 ? Hx of Afib   Hx HTN, PE/ DVT  -tele monitoring  -trend plts -AC on hold  DM II -follow glucose on BMP, not on medications at home   BPH -continue home flomax    -condom catheter   Worsening multiorgan dysfunction in frail elderly SNF patient- Palliative consulted, I am also going to touch base with daughters today when they come in  Best practice:  Diet: regular Pain/Anxiety/Delirium protocol (if indicated): N/A VAP protocol (if indicated): N/A DVT prophylaxis: SCDs GI prophylaxis: N/A Glucose control: SSI Mobility: Up to chair Code Status: full  Family Communication: family coming in today so we can chat Disposition:  ICU pending pressor liberation    The patient is critically ill with multiple organ systems failure and requires high complexity decision making for assessment and support, frequent evaluation and titration of therapies, application of advanced monitoring technologies and extensive interpretation of multiple databases. Critical Care Time devoted to patient  care services described in this note independent of APP/resident time (if applicable)  is 35 minutes.   Myrla Halsted MD [facility] 01/04/2020 10:22 AM Personal pager: [phone] If unanswered, please page CCM On-call: #[phone]

## 2020-01-05 DIAGNOSIS — R5381 Other malaise: Secondary | ICD-10-CM

## 2020-01-05 DIAGNOSIS — K7200 Acute and subacute hepatic failure without coma: Secondary | ICD-10-CM

## 2020-01-05 LAB — HEPATIC FUNCTION PANEL
ALT: 40 U/L (ref 0–44)
AST: 26 U/L (ref 15–41)
Albumin: 1.7 g/dL — ABNORMAL LOW (ref 3.5–5.0)
Alkaline Phosphatase: 180 U/L — ABNORMAL HIGH (ref 38–126)
Bilirubin, Direct: 2.6 mg/dL — ABNORMAL HIGH (ref 0.0–0.2)
Indirect Bilirubin: 2.2 mg/dL — ABNORMAL HIGH (ref 0.3–0.9)
Total Bilirubin: 4.8 mg/dL — ABNORMAL HIGH (ref 0.3–1.2)
Total Protein: 5.2 g/dL — ABNORMAL LOW (ref 6.5–8.1)

## 2020-01-05 LAB — GLUCOSE, CAPILLARY
Glucose-Capillary: 78 mg/dL (ref 70–99)
Glucose-Capillary: 73 mg/dL (ref 70–99)
Glucose-Capillary: 104 mg/dL — ABNORMAL HIGH (ref 70–99)
Glucose-Capillary: 120 mg/dL — ABNORMAL HIGH (ref 70–99)
Glucose-Capillary: 128 mg/dL — ABNORMAL HIGH (ref 70–99)
Glucose-Capillary: 102 mg/dL — ABNORMAL HIGH (ref 70–99)

## 2020-01-05 LAB — CULTURE, BLOOD (ROUTINE X 2): Special Requests: ADEQUATE

## 2020-01-05 LAB — BASIC METABOLIC PANEL
Anion gap: 11 (ref 5–15)
BUN: 58 mg/dL — ABNORMAL HIGH (ref 8–23)
CO2: 20 mmol/L — ABNORMAL LOW (ref 22–32)
Calcium: 8 mg/dL — ABNORMAL LOW (ref 8.9–10.3)
Chloride: 104 mmol/L (ref 98–111)
Creatinine, Ser: 3.42 mg/dL — ABNORMAL HIGH (ref 0.61–1.24)
GFR calc Af Amer: 17 mL/min — ABNORMAL LOW (ref >60)
GFR calc non Af Amer: 15 mL/min — ABNORMAL LOW (ref >60)
Glucose, Bld: 108 mg/dL — ABNORMAL HIGH (ref 70–99)
Potassium: 3.8 mmol/L (ref 3.5–5.1)
Sodium: 135 mmol/L (ref 135–145)

## 2020-01-05 MED ORDER — CEFAZOLIN SODIUM-DEXTROSE 2-4 GM/100ML-% IV SOLN
2.0000 g | Freq: Two times a day (BID) | INTRAVENOUS | Status: DC
  Administered 2020-01-05 – 2020-01-09 (×8): 2 g via INTRAVENOUS
  Filled 2020-01-05 (×8): qty 100

## 2020-01-05 MED ORDER — MAGIC MOUTHWASH
5.0000 mL | Freq: Three times a day (TID) | ORAL | Status: DC | PRN
  Filled 2020-01-05: qty 5

## 2020-01-05 NOTE — Progress Notes (Signed)
NAME:  Mark Bullock, MRN:  [id number], DOB:  1929-02-23, LOS: 3 ADMISSION DATE:  01/02/2020, CONSULTATION DATE:  01/02/20 REFERRING MD:  Anitra Lauth- EM , CHIEF COMPLAINT:  AMS, hypotension   Brief History   86 yo M, admitted 6/25 from nursing facility with AMS. Very poor PO intake x several days per patient, baseline high stool output. Hypotensive in ED with concern for sepsis, received 29ml/kg IVF.     Past Medical History  CVA PE / DVT  Systolic heart failure CKD DVT HTN Dm2 HLD  Significant Hospital Events   6/25 presents to ED, volume resuscitated. Evaluated by PCCM   Consults:  PCCM   Procedures:    Significant Diagnostic Tests:  RUS [place] 6/25 > mildly prominent common bile duct without definitive stone. Incidental finding of R renal cyst  CXR 6/25 > chronic asymmetric R hemidiaphragm elevation. No acute abnormality  Renal US- could not see left kidney, right kidney no obvious pyelo  Micro Data:  COVID 6/25 >> negative  BCx2 6/25 >>Klebsiella UC 6/25 >> greater than 100k colonies GNR >>E coli  Antimicrobials:   Cefepime 6/25 >>    Interim history/subjective:   Off pressors Eating breakfast  Objective   Blood pressure (!) 88/70, pulse 80, temperature 98 F (36.7 C), temperature source Axillary, resp. rate (!) 23, height 5\' 7"  (1.702 m), weight 89 kg, SpO2 99 %.        Intake/Output Summary (Last 24 hours) at 01/05/2020 0944 Last data filed at 01/05/2020 0541 Gross per 24 hour  Intake 175.65 ml  Output 1485 ml  Net -1309.35 ml   Filed Weights   01/02/20 0943 01/04/20 0358 01/05/20 0500  Weight: 79.4 kg 83.9 kg 89 kg    Examination: GEN: Weak gentleman laying in bed, no distress HEENT: Temporal wasting, oropharynx clear, pupils equal CV: Regular, distant, no murmur PULM: Few bibasilar inspiratory crackles, no wheezing GI: Nondistended, positive bowel sounds EXT: Trace pretibial edema NEURO: Awake, interacts, slightly dysarthric but able to be  understood, answers questions, moves extremities SKIN: No rash  Resolved Hospital Problem list    Assessment & Plan:   GNR Septic Shock, hemodynamically improved 6/28 Urine E coli Blood Klebsiella Plan 10 days total antibiotics Follow culture data to completion  Abdominal Distension with Hyperbilirubinemia and Mild Transaminitis.  Shock liver LFT improving, will recheck today 6/28  Acute Metabolic Encephalopathy-improved Hx CVA Suspect dementia Avoid sedating medications as able Treat underlying sepsis, improving  AKI on CKD III- FeNa c/w pre renal, possible component cardiorenal given echo findings AGMA Lactic Acidosis -cleared Continue to follow BMP, urine output Avoid nephrotoxins  Anemia  Thrombocytopenia  Likely due to sepsis, no schistocytes seen on DIC panel Hold any anticoagulation Follow CBC  Faint Expiratory Wheeze (6/25), resolved Albuterol available as needed  Acute on Chronic Systolic Heart Failure, LVEF 35-40% 09/2019 ? Hx of Afib   Hx HTN, PE/ DVT  Following telemetry Eliquis currently on hold given thrombocytopenia.   DM II Following BMP, glucose  BPH Home Flomax Condom cath in place  Worsening multiorgan dysfunction in frail elderly SNF patient- Palliative consulted, I am also going to touch base with daughters today when they come in  Best practice:  Diet: regular Pain/Anxiety/Delirium protocol (if indicated): N/A VAP protocol (if indicated): N/A DVT prophylaxis: SCDs GI prophylaxis: N/A Glucose control: SSI Mobility: Up to chair Code Status: full Family Communication:  Disposition:  Probably to progressive 6/28     The patient is critically ill with multiple  organ systems failure and requires high complexity decision making for assessment and support, frequent evaluation and titration of therapies, application of advanced monitoring technologies and extensive interpretation of multiple databases. Critical Care Time devoted to  patient care services described in this note independent of APP/resident time (if applicable)  is 31 minutes.   Levy Pupa, MD, PhD 01/05/2020, 9:54 AM [facility] [phone] or if no answer [phone]

## 2020-01-05 NOTE — Plan of Care (Signed)

## 2020-01-05 NOTE — Progress Notes (Signed)
Palliative: Mark Bullock is lying quietly in bed.  He will speak only if spoken to.  He is alert, able to make his basic needs known.  There is no family at bedside at this time.  Mark Bullock is clearly not willing to engage in meaningful conversation about goals of care today.  He does tell me that his mouth is painful.  Mouth examined, Magic mouthwash ordered.  Discussed with bedside nursing staff. Physical therapy to evaluate for rehab potential.  Call to daughter, Mark Bullock. No answer, unable to leave VM.  Call to daughter Mark Bullock who knew that she is in meeting at this point and cannot speak.  She asked to return the call in about 15 minutes.  No call from [place], called her again.  I ask family to consider if Mark Bullock was participate in short-term rehab.  I shared that we will follow up again tomorrow.  Conference with attending, bedside nursing staff, transition of care team related to patient condition, needs, goals of care.  Plan:    At this point continue full scope treatment.  Evaluate for rehab.  25 minutes Lillia Carmel, NP Palliative medicine team Team phone [phone] Greater than 50% of this time was spent counseling and coordinating care related to the above assessment and plan.

## 2020-01-06 LAB — CBC
HCT: 33 % — ABNORMAL LOW (ref 39.0–52.0)
Hemoglobin: 11.3 g/dL — ABNORMAL LOW (ref 13.0–17.0)
MCH: 27.7 pg (ref 26.0–34.0)
MCHC: 34.2 g/dL (ref 30.0–36.0)
MCV: 80.9 fL (ref 80.0–100.0)
Platelets: 41 10*3/uL — ABNORMAL LOW (ref 150–400)
RBC: 4.08 MIL/uL — ABNORMAL LOW (ref 4.22–5.81)
RDW: 15.5 % (ref 11.5–15.5)
WBC: 9.9 10*3/uL (ref 4.0–10.5)
nRBC: 0 % (ref 0.0–0.2)

## 2020-01-06 LAB — GLUCOSE, CAPILLARY
Glucose-Capillary: 102 mg/dL — ABNORMAL HIGH (ref 70–99)
Glucose-Capillary: 103 mg/dL — ABNORMAL HIGH (ref 70–99)
Glucose-Capillary: 112 mg/dL — ABNORMAL HIGH (ref 70–99)
Glucose-Capillary: 113 mg/dL — ABNORMAL HIGH (ref 70–99)
Glucose-Capillary: 152 mg/dL — ABNORMAL HIGH (ref 70–99)
Glucose-Capillary: 154 mg/dL — ABNORMAL HIGH (ref 70–99)

## 2020-01-06 LAB — BASIC METABOLIC PANEL
Anion gap: 10 (ref 5–15)
BUN: 58 mg/dL — ABNORMAL HIGH (ref 8–23)
CO2: 21 mmol/L — ABNORMAL LOW (ref 22–32)
Calcium: 8.3 mg/dL — ABNORMAL LOW (ref 8.9–10.3)
Chloride: 106 mmol/L (ref 98–111)
Creatinine, Ser: 3.17 mg/dL — ABNORMAL HIGH (ref 0.61–1.24)
GFR calc Af Amer: 19 mL/min — ABNORMAL LOW (ref >60)
GFR calc non Af Amer: 16 mL/min — ABNORMAL LOW (ref >60)
Glucose, Bld: 111 mg/dL — ABNORMAL HIGH (ref 70–99)
Potassium: 3.8 mmol/L (ref 3.5–5.1)
Sodium: 137 mmol/L (ref 135–145)

## 2020-01-06 LAB — MAGNESIUM: Magnesium: 2.1 mg/dL (ref 1.7–2.4)

## 2020-01-06 MED ORDER — SODIUM CHLORIDE 0.9 % IV SOLN: 20.0000 ug | Freq: Once | INTRAVENOUS | Status: DC

## 2020-01-06 MED ORDER — SODIUM CHLORIDE 0.9 % IV SOLN: INTRAVENOUS | Status: DC

## 2020-01-06 NOTE — Progress Notes (Signed)
Palliative: Mark Bullock is lying quietly in bed.  He again today appears mistrustful.  Nursing staff is at bedside attending to his needs.  He is able to make his basic needs known.  Mark Bullock denies needs or concerns at this time.  There is no family at bedside at this time.  We talked about going to rehab, and he states that he would have to go if he qualified.  We briefly talked about his mouth which he told me her yesterday.  He denies issues today.  He has an open bag of pork grinds at bedside, and I asked him if he is enjoying them.  He tells me that he is not, he is angry that his children have brought him a big bag when he wanted a small bag.  No call to family today.   Conference with attending, bedside nursing staff, transition of care team related to patient condition, needs, goals of care, disposition.  Plan:  At this point continue full scope treatment.  Evaluate for rehab.  15 minutes Mark Carmel, NP Palliative medicine team Team phone [phone] Greater than 50% of this time was spent counseling and coordinating care related to the above assessment and plan.

## 2020-01-06 NOTE — Progress Notes (Signed)
Physical Therapy Treatment Patient Details Name: Mark Bullock MRN: [id number] DOB: [birth year] Today's Date: 01/06/2020    History of Present Illness 86 y.o. 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 +sepsis due to UTI    PT Comments    Pt was flat off affect, generally agitated, but participated reluctantly.  Emphasis on warm up LE ROM exercise, transitions to EOB, sitting balance.   Follow Up Recommendations  SNF;Other (comment) (A living if pt can be assisted)     Equipment Recommendations  Other (comment) (TBA)    Recommendations for Other Services       Precautions / Restrictions Precautions Precautions: Fall    Mobility  Bed Mobility Overal bed mobility: Needs Assistance Bed Mobility: Supine to Sit;Sit to Supine     Supine to sit: +2 for physical assistance;Max assist Sit to supine: +2 for physical assistance;Max assist   General bed mobility comments: cues for direction and assist given slowly to give time for pt to assist.  Transfers                 General transfer comment: deferred by pt.  Ambulation/Gait                 Stairs             Wheelchair Mobility    Modified Rankin (Stroke Patients Only)       Balance Overall balance assessment: Needs assistance Sitting-balance support: Single extremity supported;Bilateral upper extremity supported Sitting balance-Leahy Scale: Poor Sitting balance - Comments: Assisted balance at EOB for about 4-5 min before pt able to maintain balance at EOB with close guard.                                    Cognition Arousal/Alertness: Awake/alert Behavior During Therapy:  Restless;Agitated Overall Cognitive Status: No family/caregiver present to determine baseline cognitive functioning                                        Exercises Other Exercises Other Exercises: warm up ROM gross flex/ext with graded resistance gross extention    General Comments General comments (skin integrity, edema, etc.): vss      Pertinent Vitals/Pain Faces Pain Scale: No hurt Pain Intervention(s): Monitored during session    Home Living                      Prior Function            PT Goals (current goals can now be found in the care plan section) Acute Rehab PT Goals Patient Stated Goal: pt not willing to state "I don't have to tell you that" PT Goal Formulation: Patient unable to participate in goal setting Time For Goal Achievement: 01/18/20 Potential to Achieve Goals: Fair Progress towards PT goals: Progressing toward goals    Frequency    Min 2X/week      PT Plan Current plan remains appropriate    Co-evaluation              AM-PAC PT "6 Clicks" Mobility  Outcome Measure  Help needed turning from your back to your side while in a flat bed without using bedrails?: A Lot Help needed moving from lying on your back to sitting on the side of a flat bed without using bedrails?: Total Help needed moving to and from a bed to a chair (including a wheelchair)?: Total Help needed standing up from a chair using your arms (e.g., wheelchair or bedside chair)?: Total Help needed to walk in hospital room?: Total Help needed climbing 3-5 steps with a railing? : Total 6 Click Score: 7    End of Session   Activity Tolerance: Treatment limited secondary to medical complications (Comment);Treatment limited secondary to agitation Patient left: in bed;with call bell/phone within reach;with bed alarm set Nurse Communication: Other (comment) PT Visit Diagnosis: Muscle weakness (generalized) (M62.81);Difficulty in walking, not elsewhere  classified (R26.2)     Time: 8127-5170 PT Time Calculation (min) (ACUTE ONLY): 30 min  Charges:  $Therapeutic Exercise: 8-22 mins $Therapeutic Activity: 8-22 mins                     01/06/2020  Jacinto Halim., PT Acute Rehabilitation Services [phone]  (pager) [phone]  (office)   Eliseo Gum Nikolas Casher 01/06/2020, 4:31 PM

## 2020-01-06 NOTE — Progress Notes (Addendum)
PROGRESS NOTE    Mark Bullock  UEA:[id number] DOB: 12-17-1928 DOA: 01/02/2020 PCP: [name], [name], MD   Brief Narrative:  86 yo M, admitted 6/25 from nursing facility with AMS. Very poor PO intake x several days per patient, baseline high stool output. Hypotensive in ED with concern for sepsis, received 7630ml/kg IVF.    RUS US 6/25 > mildly prominent common bile duct without definitive stone. Incidental finding of R renal cyst  CXR 6/25 > chronic asymmetric R hemidiaphragm elevation. No acute abnormality  Renal US- could not see left kidney, right kidney no obvious pyelo.  Blood culture x2 01/02/2020 urine grew Klebsiella.  Urine culture grew E. coli on 01/02/2020.  Assessment & Plan:   Principal Problem:   Sepsis (HCC) Active Problems:   Bilateral lower extremity edema   Physical deconditioning   AKI (acute kidney injury) (HCC)   Essential hypertension, benign   Dyslipidemia associated with type 2 diabetes mellitus (HCC)   Chronic systolic CHF (congestive heart failure) (HCC)   High anion gap metabolic acidosis   Elevated liver enzymes   Total bilirubin, elevated   Thrombocytopenia (HCC)   Acute hypoxemic respiratory failure (HCC)   Acute lower UTI   DVT (deep venous thrombosis) (HCC)   Septic shock (HCC)   Pressure injury of skin   Palliative care by specialist  GNR Septic Shock: Urine culture growing E. coli which is pansensitive.  Blood culture from 01/02/2020 growing Klebsiella which is also sensitive to everything but ampicillin.  Patient off pressors for more than 24 hours.  Blood pressure borderline low.  Continue cefazolin and gentle IV hydration.  Abdominal Distension with Hyperbilirubinemia and Mild Transaminitis/shock liver: LFTs back to normal.  Acute Metabolic Encephalopathy-improved Hx CVA Suspect dementia Avoid sedating medications as able Treat underlying sepsis, improving  AKI on CKD III- FeNa c/w pre renal, possible component cardiorenal given  echo findings of low ejection fraction.  Initially with anion gap metabolic acidosis.  CO2 improved.  Anion gap closed.  Creatinine improving but slowly.  Peaked at 3.71 on 01/03/2020.  He is net positive since admission.  Avoid nephrotoxic agents.  Consider nephrology consult if no improvement.  Anemia / Thrombocytopenia:  Likely due to sepsis, no schistocytes seen on DIC panel Hold any anticoagulation.  Hemoglobin and platelets are stable. Follow CBC  Faint Expiratory Wheeze (6/25), resolved Albuterol available as needed  Acute on Chronic Systolic Heart Failure, LVEF 35-40% 09/2019 ? Hx of Afib   Hx HTN, PE/ DVT  Following telemetry Eliquis currently on hold given thrombocytopenia.  DM TYPE II: No history of diabetes.  Hemoglobin A1c within normal range.  Blood sugar slightly elevated.  Not on any home medications for diabetes either.  Will discontinue CBG.  BPH: Continue Flomax.  Worsening multiorgan dysfunction in frail elderly SNF patient- Palliative consulted.  DVT prophylaxis: SCDs Start: 01/02/20 1551   Code Status: Full Code  Family Communication:  None present at bedside.  Patient alert.  Status is: Inpatient  Remains inpatient appropriate because:Inpatient level of care appropriate due to severity of illness   Dispo: The patient is from: SNF              Anticipated d/c is to: SNF              Anticipated d/c date is: 2 days              Patient currently is not medically stable to d/c.        Estimated body  mass index is 30.39 kg/m as calculated from the following:   Height as of this encounter:  (1.702 m).   Weight as of this encounter: 88 kg.  Pressure Injury 01/02/20 Sacrum Medial Stage 2 -  Partial thickness loss of dermis presenting as a shallow open injury with a red, pink wound bed without slough. (Active)  01/02/20 1706  Location: Sacrum  Location Orientation: Medial  Staging: Stage 2 -  Partial thickness loss of dermis presenting as a  shallow open injury with a red, pink wound bed without slough.  Wound Description (Comments):   Present on Admission: Yes     Nutritional status:               Consultants:   None  Procedures:   None  Antimicrobials:  Anti-infectives (From admission, onward)   Start     Dose/Rate Route Frequency Ordered Stop   01/05/20 2330  ceFAZolin (ANCEF) IVPB 2g/100 mL premix     Discontinue     2 g 200 mL/hr over 30 Minutes Intravenous Every 12 hours 01/05/20 1341 01/12/20 2359   01/03/20 1200  ceFEPIme (MAXIPIME) 2 g in sodium chloride 0.9 % 100 mL IVPB  Status:  Discontinued        2 g 200 mL/hr over 30 Minutes Intravenous Every 24 hours 01/02/20 1134 01/05/20 1341   01/03/20 1000  azithromycin (ZITHROMAX) tablet 250 mg  Status:  Discontinued       "Followed by" Linked Group Details   250 mg Oral Daily 01/02/20 1744 01/03/20 0829   01/02/20 1745  azithromycin (ZITHROMAX) tablet 500 mg       "Followed by" Linked Group Details   500 mg Oral Daily 01/02/20 1744 01/02/20 1806   01/02/20 1132  vancomycin variable dose per unstable renal function (pharmacist dosing)  Status:  Discontinued         Does not apply See admin instructions 01/02/20 1132 01/03/20 0838   01/02/20 1000  vancomycin (VANCOREADY) IVPB 1500 mg/300 mL        1,500 mg 150 mL/hr over 120 Minutes Intravenous  Once 01/02/20 0952 01/02/20 1304   01/02/20 1000  ceFEPIme (MAXIPIME) 2 g in sodium chloride 0.9 % 100 mL IVPB        2 g 200 mL/hr over 30 Minutes Intravenous  Once 01/02/20 0952 01/02/20 1351   01/02/20 0945  metroNIDAZOLE (FLAGYL) IVPB 500 mg        500 mg 100 mL/hr over 60 Minutes Intravenous  Once 01/02/20 0943 01/02/20 1055         Subjective: Seen and examined.  Patient alert and mostly oriented.  Complained of some pain in the right hand.  No other complaint.  Objective: Vitals:   01/06/20 1200 01/06/20 1300 01/06/20 1400 01/06/20 1500  BP: 94/61 104/71 96/63   Pulse: 72 74 78   Resp:  (!) 22 20 (!) 25   Temp:    98.5 F (36.9 C)  TempSrc:    Oral  SpO2: 100% 97% 100%   Weight:      Height:        Intake/Output Summary (Last 24 hours) at 01/06/2020 1543 Last data filed at 01/06/2020 1500 Gross per 24 hour  Intake 1461.41 ml  Output 2100 ml  Net -638.59 ml   Filed Weights   01/04/20 0358 01/05/20 0500 01/06/20 0500  Weight: 83.9 kg 89 kg 88 kg    Examination:  General exam: Appears comfortable but slightly angry. Respiratory system:  Clear to auscultation. Respiratory effort normal. Cardiovascular system: S1 & S2 heard, RRR. No JVD, murmurs, rubs, gallops or clicks. No pedal edema.  Slight edema in right hand. Gastrointestinal system: Abdomen is nondistended, soft and nontender. No organomegaly or masses felt. Normal bowel sounds heard. Central nervous system: Alert and oriented x2. No focal neurological deficits. Extremities: Symmetric 5 x 5 power. Skin: No rashes, lesions or ulcers  Data Reviewed: I have personally reviewed following labs and imaging studies  CBC: Recent Labs  Lab 01/02/20 1047 01/02/20 1047 01/02/20 1557 01/03/20 0404 01/04/20 0350 01/04/20 1028 01/06/20 0331  WBC 25.2*  --  20.7* 30.0* 20.4*  --  9.9  NEUTROABS 23.4*  --   --   --   --   --   --   HGB 12.4*  --  10.9* 11.5* 11.0*  --  11.3*  HCT 38.1*  --  33.9* 34.2* 31.7*  --  33.0*  MCV 86.4  --  86.7 84.2 81.7  --  80.9  PLT 110*   < > 93* 53* 39* 34* 41*   < > = values in this interval not displayed.   Basic Metabolic Panel: Recent Labs  Lab 01/02/20 1047 01/02/20 1047 01/02/20 1425 01/02/20 1557 01/03/20 0404 01/04/20 0350 01/05/20 1036 01/06/20 0331  NA 137  --   --   --  135 136 135 137  K 4.1  --   --   --  4.1 4.0 3.8 3.8  CL 100  --   --   --  103 104 104 106  CO2 19*  --   --   --  17* 22 20* 21*  GLUCOSE 75  --   --   --  96 116* 108* 111*  BUN 38*  --   --   --  48* 55* 58* 58*  CREATININE 3.32*   < >  --  3.22* 3.71* 3.61* 3.42* 3.17*  CALCIUM 8.4*   --   --   --  8.0* 8.0* 8.0* 8.3*  MG  --   --  1.2*  --   --   --   --  2.1  PHOS  --   --  2.8  --   --   --   --   --    < > = values in this interval not displayed.   GFR: Estimated Creatinine Clearance: 16.7 mL/min (A) (by C-G formula based on SCr of 3.17 mg/dL (H)). Liver Function Tests: Recent Labs  Lab 01/02/20 1047 01/03/20 0404 01/04/20 0350 01/05/20 1036  AST 100* 59* 32 26  ALT 130* 89* 52* 40  ALKPHOS 210* 158* 121 180*  BILITOT 6.1* 7.0* 4.7* 4.8*  PROT 6.2* 5.7* 5.1* 5.2*  ALBUMIN 2.6* 2.3* 1.9* 1.7*   Recent Labs  Lab 01/02/20 1557  LIPASE 18  AMYLASE 175*   Recent Labs  Lab 01/02/20 1339  AMMONIA 34   Coagulation Profile: Recent Labs  Lab 01/02/20 1047 01/03/20 0404 01/04/20 1028  INR 2.9* 2.1* 1.5*   Cardiac Enzymes: No results for input(s): CKTOTAL, CKMB, CKMBINDEX, TROPONINI in the last 168 hours. BNP (last 3 results) No results for input(s): PROBNP in the last 8760 hours. HbA1C: No results for input(s): HGBA1C in the last 72 hours. CBG: Recent Labs  Lab 01/05/20 2303 01/06/20 0303 01/06/20 0707 01/06/20 1107 01/06/20 1504  GLUCAP 102* 102* 103* 152* 154*   Lipid Profile: No results for input(s): CHOL, HDL, LDLCALC, TRIG, CHOLHDL, LDLDIRECT in  the last 72 hours. Thyroid Function Tests: No results for input(s): TSH, T4TOTAL, FREET4, T3FREE, THYROIDAB in the last 72 hours. Anemia Panel: No results for input(s): VITAMINB12, FOLATE, FERRITIN, TIBC, IRON, RETICCTPCT in the last 72 hours. Sepsis Labs: Recent Labs  Lab 01/02/20 1011 01/02/20 1340 01/02/20 1425  PROCALCITON  --   --  >150.00  LATICACIDVEN 6.1* 3.5*  --     Recent Results (from the past 240 hour(s))  Blood Culture (routine x 2)     Status: Abnormal   Collection Time: 01/02/20  9:37 AM   Specimen: BLOOD  Result Value Ref Range Status   Specimen Description BLOOD RIGHT ANTECUBITAL  Final   Special Requests   Final    BOTTLES DRAWN AEROBIC AND ANAEROBIC Blood  Culture adequate volume   Culture  Setup Time   Final    IN BOTH AEROBIC AND ANAEROBIC BOTTLES GRAM NEGATIVE RODS CRITICAL RESULT CALLED TO, READ BACK BY AND VERIFIED WITH: PHRMD V BRYK @0414  01/03/20 BY S GEZAHEGN    Culture KLEBSIELLA PNEUMONIAE (A)  Final   Report Status 01/05/2020 FINAL  Final   Organism ID, Bacteria KLEBSIELLA PNEUMONIAE  Final      Susceptibility   Klebsiella pneumoniae - MIC*    AMPICILLIN RESISTANT Resistant     CEFAZOLIN <=4 SENSITIVE Sensitive     CEFEPIME <=0.12 SENSITIVE Sensitive     CEFTAZIDIME <=1 SENSITIVE Sensitive     CEFTRIAXONE <=0.25 SENSITIVE Sensitive     CIPROFLOXACIN <=0.25 SENSITIVE Sensitive     GENTAMICIN <=1 SENSITIVE Sensitive     IMIPENEM <=0.25 SENSITIVE Sensitive     TRIMETH/SULFA <=20 SENSITIVE Sensitive     AMPICILLIN/SULBACTAM <=2 SENSITIVE Sensitive     PIP/TAZO Value in next row Sensitive      <=4 SENSITIVEPerformed at [facility], [address] [place], [place]    * KLEBSIELLA PNEUMONIAE  Blood Culture ID Panel (Reflexed)     Status: Abnormal   Collection Time: 01/02/20  9:37 AM  Result Value Ref Range Status   Enterococcus species NOT DETECTED NOT DETECTED Final   Listeria monocytogenes NOT DETECTED NOT DETECTED Final   Staphylococcus species NOT DETECTED NOT DETECTED Final   Staphylococcus aureus (BCID) NOT DETECTED NOT DETECTED Final   Streptococcus species NOT DETECTED NOT DETECTED Final   Streptococcus agalactiae NOT DETECTED NOT DETECTED Final   Streptococcus pneumoniae NOT DETECTED NOT DETECTED Final   Streptococcus pyogenes NOT DETECTED NOT DETECTED Final   Acinetobacter baumannii NOT DETECTED NOT DETECTED Final   Enterobacteriaceae species DETECTED (A) NOT DETECTED Final    Comment: Enterobacteriaceae represent a large family of gram-negative bacteria, not a single organism. CRITICAL RESULT CALLED TO, READ BACK BY AND VERIFIED WITH: PHRMD V BRYK @0414  01/03/20 BY S GEZAHEGN    Enterobacter  cloacae complex NOT DETECTED NOT DETECTED Final   Escherichia coli NOT DETECTED NOT DETECTED Final   Klebsiella oxytoca NOT DETECTED NOT DETECTED Final   Klebsiella pneumoniae DETECTED (A) NOT DETECTED Final    Comment: CRITICAL RESULT CALLED TO, READ BACK BY AND VERIFIED WITH: PHRMD V BRYK @0414  01/03/20 BY S GEZAHEGN    Proteus species NOT DETECTED NOT DETECTED Final   Serratia marcescens NOT DETECTED NOT DETECTED Final   Carbapenem resistance NOT DETECTED NOT DETECTED Final   Haemophilus influenzae NOT DETECTED NOT DETECTED Final   Neisseria meningitidis NOT DETECTED NOT DETECTED Final   Pseudomonas aeruginosa NOT DETECTED NOT DETECTED Final   Candida albicans NOT DETECTED NOT DETECTED  Final   Candida glabrata NOT DETECTED NOT DETECTED Final   Candida krusei NOT DETECTED NOT DETECTED Final   Candida parapsilosis NOT DETECTED NOT DETECTED Final   Candida tropicalis NOT DETECTED NOT DETECTED Final    Comment: Performed at [facility], [address], [place], [place] [address]  SARS Coronavirus 2 by RT PCR (hospital order, performed in [facility] hospital lab) Nasopharyngeal Nasopharyngeal Swab     Status: None   Collection Time: 01/02/20  9:45 AM   Specimen: Nasopharyngeal Swab  Result Value Ref Range Status   SARS Coronavirus 2 NEGATIVE NEGATIVE Final    Comment: (NOTE) SARS-CoV-2 target nucleic acids are NOT DETECTED.  The SARS-CoV-2 RNA is generally detectable in upper and lower respiratory specimens during the acute phase of infection. The lowest concentration of SARS-CoV-2 viral copies this assay can detect is 250 copies / mL. A negative result does not preclude SARS-CoV-2 infection and should not be used as the sole basis for treatment or other patient management decisions.  A negative result may occur with improper specimen collection / handling, submission of specimen other than nasopharyngeal swab, presence of viral mutation(s) within the areas targeted by this  assay, and inadequate number of viral copies (<250 copies / mL). A negative result must be combined with clinical observations, patient history, and epidemiological information.  Fact Sheet for Patients:   BoilerBrush.com.cy  Fact Sheet for Healthcare Providers: https://pope.com/  This test is not yet approved or  cleared by the [place] FDA and has been authorized for detection and/or diagnosis of SARS-CoV-2 by FDA under an Emergency Use Authorization (EUA).  This EUA will remain in effect (meaning this test can be used) for the duration of the COVID-19 declaration under Section 564(b)(1) of the Act, 21 U.S.C. section 360bbb-3(b)(1), unless the authorization is terminated or revoked sooner.  Performed at [facility], [address], [place], [place] [address]   Blood Culture (routine x 2)     Status: Abnormal   Collection Time: 01/02/20  9:46 AM   Specimen: BLOOD LEFT FOREARM  Result Value Ref Range Status   Specimen Description BLOOD LEFT FOREARM  Final   Special Requests   Final    BOTTLES DRAWN AEROBIC AND ANAEROBIC Blood Culture results may not be optimal due to an inadequate volume of blood received in culture bottles   Culture  Setup Time   Final    IN BOTH AEROBIC AND ANAEROBIC BOTTLES GRAM NEGATIVE RODS CRITICAL VALUE NOTED.  VALUE IS CONSISTENT WITH PREVIOUSLY REPORTED AND CALLED VALUE.    Culture (A)  Final    KLEBSIELLA PNEUMONIAE SUSCEPTIBILITIES PERFORMED ON PREVIOUS CULTURE WITHIN THE LAST 5 DAYS. Performed at [facility], [address], [place], [place] [address]    Report Status 01/05/2020 FINAL  Final  Urine culture     Status: Abnormal   Collection Time: 01/02/20 10:05 AM   Specimen: In/Out Cath Urine  Result Value Ref Range Status   Specimen Description IN/OUT CATH URINE  Final   Special Requests   Final    NONE Performed at [facility], [address] [place], [place]  [address]    Culture >=100,000 COLONIES/mL ESCHERICHIA COLI (A)  Final   Report Status 01/04/2020 FINAL  Final   Organism ID, Bacteria ESCHERICHIA COLI (A)  Final      Susceptibility   Escherichia coli - MIC*    AMPICILLIN 4 SENSITIVE Sensitive     CEFAZOLIN <=4  SENSITIVE Sensitive     CEFTRIAXONE <=0.25 SENSITIVE Sensitive     CIPROFLOXACIN <=0.25 SENSITIVE Sensitive     GENTAMICIN <=1 SENSITIVE Sensitive     IMIPENEM <=0.25 SENSITIVE Sensitive     NITROFURANTOIN <=16 SENSITIVE Sensitive     TRIMETH/SULFA <=20 SENSITIVE Sensitive     AMPICILLIN/SULBACTAM <=2 SENSITIVE Sensitive     PIP/TAZO <=4 SENSITIVE Sensitive     * >=100,000 COLONIES/mL ESCHERICHIA COLI  MRSA PCR Screening     Status: None   Collection Time: 01/02/20  4:44 PM   Specimen: Nasal Mucosa; Nasopharyngeal  Result Value Ref Range Status   MRSA by PCR NEGATIVE NEGATIVE Final    Comment:        The GeneXpert MRSA Assay (FDA approved for NASAL specimens only), is one component of a comprehensive MRSA colonization surveillance program. It is not intended to diagnose MRSA infection nor to guide or monitor treatment for MRSA infections. Performed at [facility], [address], [place], [place] [address]       Radiology Studies: No results found.  Scheduled Meds: . Chlorhexidine Gluconate Cloth  6 each Topical Daily  . guaiFENesin  600 mg Oral BID  . mouth rinse  15 mL Mouth Rinse BID  . midodrine  5 mg Oral TID  . tamsulosin  0.4 mg Oral QHS   Continuous Infusions: . sodium chloride Stopped (01/05/20 1649)  . sodium chloride 75 mL/hr at 01/06/20 1500  .  ceFAZolin (ANCEF) IV Stopped (01/06/20 1213)     LOS: 4 days   Time spent: 40 minutes   Hughie Closs, MD [facility]  01/06/2020, 3:43 PM   To contact the attending provider between 7A-7P or the covering provider during after hours 7P-7A, please log into the web site [email or web address].

## 2020-01-07 LAB — CBC WITH DIFFERENTIAL/PLATELET
Abs Immature Granulocytes: 0.76 10*3/uL — ABNORMAL HIGH (ref 0.00–0.07)
Basophils Absolute: 0 10*3/uL (ref 0.0–0.1)
Basophils Relative: 0 %
Eosinophils Absolute: 0.1 10*3/uL (ref 0.0–0.5)
Eosinophils Relative: 1 %
HCT: 32.1 % — ABNORMAL LOW (ref 39.0–52.0)
Hemoglobin: 11 g/dL — ABNORMAL LOW (ref 13.0–17.0)
Immature Granulocytes: 8 %
Lymphocytes Relative: 9 %
Lymphs Abs: 0.9 10*3/uL (ref 0.7–4.0)
MCH: 27.6 pg (ref 26.0–34.0)
MCHC: 34.3 g/dL (ref 30.0–36.0)
MCV: 80.5 fL (ref 80.0–100.0)
Monocytes Absolute: 1.3 10*3/uL — ABNORMAL HIGH (ref 0.1–1.0)
Monocytes Relative: 13 %
Neutro Abs: 6.9 10*3/uL (ref 1.7–7.7)
Neutrophils Relative %: 69 %
Platelets: 99 10*3/uL — ABNORMAL LOW (ref 150–400)
RBC: 3.99 MIL/uL — ABNORMAL LOW (ref 4.22–5.81)
RDW: 15.4 % (ref 11.5–15.5)
WBC: 10 10*3/uL (ref 4.0–10.5)
nRBC: 0 % (ref 0.0–0.2)

## 2020-01-07 LAB — BASIC METABOLIC PANEL
Anion gap: 9 (ref 5–15)
BUN: 53 mg/dL — ABNORMAL HIGH (ref 8–23)
CO2: 19 mmol/L — ABNORMAL LOW (ref 22–32)
Calcium: 8.1 mg/dL — ABNORMAL LOW (ref 8.9–10.3)
Chloride: 107 mmol/L (ref 98–111)
Creatinine, Ser: 2.98 mg/dL — ABNORMAL HIGH (ref 0.61–1.24)
GFR calc Af Amer: 21 mL/min — ABNORMAL LOW (ref >60)
GFR calc non Af Amer: 18 mL/min — ABNORMAL LOW (ref >60)
Glucose, Bld: 106 mg/dL — ABNORMAL HIGH (ref 70–99)
Potassium: 3.7 mmol/L (ref 3.5–5.1)
Sodium: 135 mmol/L (ref 135–145)

## 2020-01-07 LAB — GLUCOSE, CAPILLARY
Glucose-Capillary: 94 mg/dL (ref 70–99)
Glucose-Capillary: 130 mg/dL — ABNORMAL HIGH (ref 70–99)
Glucose-Capillary: 101 mg/dL — ABNORMAL HIGH (ref 70–99)
Glucose-Capillary: 99 mg/dL (ref 70–99)

## 2020-01-07 LAB — SARS CORONAVIRUS 2 BY RT PCR (HOSPITAL ORDER, PERFORMED IN ~~LOC~~ HOSPITAL LAB): SARS Coronavirus 2: NEGATIVE

## 2020-01-07 NOTE — [place] FL2 (Addendum)
[place] MEDICAID FL2 LEVEL OF CARE SCREENING TOOL     IDENTIFICATION  Patient Name: Mark Bullock Birthdate: [birth year] Sex: male Admission Date (Current Location): 01/02/2020  [facility] and [place] Number:  Producer, television/film/video and Address:  [facility] [place], [address], [place], [place] [address]      Provider Number: [id number]  Attending Physician Name and Address:  Hughie Closs, MD  Relative Name and Phone Number:  Para March, daughter, [phone]    Current Level of Care: Hospital Recommended Level of Care: Skilled Nursing Facility Prior Approval Number:    Date Approved/Denied:   PASRR Number: [id number]  Discharge Plan: SNF    Current Diagnoses: Patient Active Problem List   Diagnosis Date Noted  . Palliative care by specialist   . Sepsis (HCC) 01/02/2020  . High anion gap metabolic acidosis 01/02/2020  . Elevated liver enzymes 01/02/2020  . Total bilirubin, elevated 01/02/2020  . Thrombocytopenia (HCC) 01/02/2020  . Acute hypoxemic respiratory failure (HCC) 01/02/2020  . Acute lower UTI 01/02/2020  . DVT (deep venous thrombosis) (HCC) 01/02/2020  . Septic shock (HCC) 01/02/2020  . Pressure injury of skin 01/02/2020  . Goals of care, counseling/discussion   . Palliative care encounter   . Cellulitis of both lower extremities 09/19/2019  . Cellulitis 09/19/2019  . Chronic systolic CHF (congestive heart failure) (HCC) 03/30/2019  . Leg DVT (deep venous thromboembolism), acute, left (HCC) 03/30/2019  . Acute pulmonary embolism without acute cor pulmonale (HCC) 03/30/2019  . Cellulitis of leg, left 03/29/2019  . Chronic cerebrovascular accident (CVA) 07/07/2017  . Essential hypertension, benign 05/24/2017  . Type II diabetes mellitus with stage 3 chronic kidney disease (HCC) 05/24/2017  . Dyslipidemia associated with type 2 diabetes mellitus (HCC) 05/24/2017  . Vascular dementia without behavioral disturbance (HCC) 05/24/2017   . Vitamin B 12 deficiency 05/24/2017  . Rhabdomyolysis 05/19/2017  . AKI (acute kidney injury) (HCC) 05/18/2017  . Elevated troponin 05/18/2017  . History of completed stroke 05/18/2017  . CKD (chronic kidney disease), stage III 05/18/2017  . Physical deconditioning 05/08/2017  . Osteoarthritis 05/08/2017  . Bilateral lower extremity edema   . Abnormal EKG   . Elevated brain natriuretic peptide (BNP) level     Orientation RESPIRATION BLADDER Height & Weight     Self, Situation, Place  Normal Continent, External catheter Weight: 193 lb 5.5 oz (87.7 kg) Height:  5\' 7"  (170.2 cm)  BEHAVIORAL SYMPTOMS/MOOD NEUROLOGICAL BOWEL NUTRITION STATUS      Continent Diet (Please see DC Summary)  AMBULATORY STATUS COMMUNICATION OF NEEDS Skin   Extensive Assist Verbally PU Stage and Appropriate Care (Stage II on sacrum)                       Personal Care Assistance Level of Assistance  Bathing, Feeding, Dressing Bathing Assistance: Maximum assistance Feeding assistance: Limited assistance Dressing Assistance: Maximum assistance     Functional Limitations Info  Sight, Hearing, Speech Sight Info: Adequate Hearing Info: Adequate Speech Info: Adequate    SPECIAL CARE FACTORS FREQUENCY  PT (By licensed PT), OT (By licensed OT)     PT Frequency: 5x/week OT Frequency: 5x/week            Contractures Contractures Info: Not present    Additional Factors Info  Code Status, Allergies Code Status Info: Full Allergies Info: NKA           Current Medications (01/07/2020):  This is the current hospital active medication  list Current Facility-Administered Medications  Medication Dose Route Frequency Provider Last Rate Last Admin  . 0.9 %  sodium chloride infusion   Intravenous PRN Leslye Peer, MD   Stopped at 01/05/20 1649  . 0.9 %  sodium chloride infusion   Intravenous Continuous Hughie Closs, MD 75 mL/hr at 01/06/20 1800 Rate Verify at 01/06/20 1800  . acetaminophen  (TYLENOL) tablet 650 mg  650 mg Oral Q6H PRN Leslye Peer, MD   650 mg at 01/02/20 2341   Or  . acetaminophen (TYLENOL) suppository 650 mg  650 mg Rectal Q6H PRN Leslye Peer, MD      . albuterol (PROVENTIL) (2.5 MG/3ML) 0.083% nebulizer solution 2.5 mg  2.5 mg Nebulization Q2H PRN Leslye Peer, MD   2.5 mg at 01/03/20 1209  . ceFAZolin (ANCEF) IVPB 2g/100 mL premix  2 g Intravenous Q12H Leslye Peer, MD 200 mL/hr at 01/07/20 1008 2 g at 01/07/20 1008  . Chlorhexidine Gluconate Cloth 2 % PADS 6 each  6 each Topical Daily Leslye Peer, MD   6 each at 01/07/20 1009  . docusate sodium (COLACE) capsule 100 mg  100 mg Oral BID PRN Leslye Peer, MD      . guaiFENesin (MUCINEX) 12 hr tablet 600 mg  600 mg Oral BID Leslye Peer, MD   600 mg at 01/07/20 1009  . magic mouthwash  5 mL Oral TID PRN Lillia Carmel A, NP      . MEDLINE mouth rinse  15 mL Mouth Rinse BID Leslye Peer, MD   15 mL at 01/07/20 1009  . midodrine (PROAMATINE) tablet 5 mg  5 mg Oral TID Leslye Peer, MD   5 mg at 01/07/20 1009  . ondansetron (ZOFRAN) tablet 4 mg  4 mg Oral Q6H PRN Leslye Peer, MD       Or  . ondansetron (ZOFRAN) injection 4 mg  4 mg Intravenous Q6H PRN Leslye Peer, MD      . polyethylene glycol (MIRALAX / GLYCOLAX) packet 17 g  17 g Oral Daily PRN Leslye Peer, MD      . tamsulosin [facility]) capsule 0.4 mg  0.4 mg Oral QHS Leslye Peer, MD   0.4 mg at 01/06/20 2113     Discharge Medications: Please see discharge summary for a list of discharge medications.  Relevant Imaging Results:  Relevant Lab Results:   Additional Information SSN: [id number]     Has received COVID vaccines.  Mearl Latin, LCSW

## 2020-01-07 NOTE — TOC Initial Note (Signed)
Transition of Care [facility]) - Initial/Assessment Note    Patient Details  Name: Mark Bullock MRN: [id number] Date of Birth: 1929/01/31  Transition of Care [facility]) CM/SW Contact:    Mark Haven, RN Phone Number: 01/07/2020, 2:39 PM  Clinical Narrative:                 NCM spoke with patient and his daughter Mark Bullock , she and patient confirmed that he will go to SNF at discharge. Mark Bullock is preferred.  Information faxed out, awaiting bed offers.  Notified MD to order Covid test. Patient is from [facility].  He will need rehab before he goes back to [facility].  He will be ready as soon as bed is available.   Expected Discharge Plan: Skilled Nursing Facility Barriers to Discharge: Continued Medical Work up   Patient Goals and CMS Choice Patient states their goals for this hospitalization and ongoing recovery are:: get better CMS Medicare.gov Compare Post Acute Care list provided to:: Patient Represenative (must comment) Choice offered to / list presented to : Adult Children (preference [name])  Expected Discharge Plan and Services Expected Discharge Plan: Skilled Nursing Facility In-house Referral: NA Discharge Planning Services: CM Consult   Living arrangements for the past 2 months: Independent Living Facility                   DME Agency: NA       HH Arranged: NA          Prior Living Arrangements/Services Living arrangements for the past 2 months: Independent Living Facility Lives with:: Self Patient language and need for interpreter reviewed:: Yes        Need for Family Participation in Patient Care: Yes (Comment) Care giver support system in place?: Yes (comment)   Criminal Activity/Legal Involvement Pertinent to Current Situation/Hospitalization: No - Comment as needed  Activities of Daily Living Home Assistive Devices/Equipment: Environmental consultant (specify type), Wheelchair ADL Screening (condition at time of admission) Patient's  cognitive ability adequate to safely complete daily activities?: Yes Is the patient deaf or have difficulty hearing?: No Does the patient have difficulty seeing, even when wearing glasses/contacts?: No Does the patient have difficulty concentrating, remembering, or making decisions?: No Patient able to express need for assistance with ADLs?: Yes Does the patient have difficulty dressing or bathing?: Yes Independently performs ADLs?: No Communication: Independent Dressing (OT): Needs assistance Is this a change from baseline?: Pre-admission baseline Grooming: Needs assistance Is this a change from baseline?: Pre-admission baseline Feeding: Appropriate for developmental age Bathing: Needs assistance Is this a change from baseline?: Pre-admission baseline Does the patient have difficulty walking or climbing stairs?: No Weakness of Legs: Both Weakness of Arms/Hands: Both  Permission Sought/Granted                  Emotional Assessment Appearance:: Appears stated age Attitude/Demeanor/Rapport: Engaged Affect (typically observed): Appropriate Orientation: : Oriented to Self, Oriented to Place, Oriented to  Time, Oriented to Situation Alcohol / Substance Use: Not Applicable Psych Involvement: No (comment)  Admission diagnosis:  Bilirubinemia [E80.6] Shock (HCC) [R57.9] AKI (acute kidney injury) (HCC) [N17.9] Sepsis (HCC) [A41.9] Sepsis with acute renal failure and septic shock, due to unspecified organism, unspecified acute renal failure type (HCC) [A41.9, R65.21, N17.9] Patient Active Problem List   Diagnosis Date Noted  . Palliative care by specialist   . Sepsis (HCC) 01/02/2020  . High anion gap metabolic acidosis 01/02/2020  . Elevated liver enzymes 01/02/2020  . Total bilirubin,  elevated 01/02/2020  . Thrombocytopenia (HCC) 01/02/2020  . Acute hypoxemic respiratory failure (HCC) 01/02/2020  . Acute lower UTI 01/02/2020  . DVT (deep venous thrombosis) (HCC) 01/02/2020   . Septic shock (HCC) 01/02/2020  . Pressure injury of skin 01/02/2020  . Goals of care, counseling/discussion   . Palliative care encounter   . Cellulitis of both lower extremities 09/19/2019  . Cellulitis 09/19/2019  . Chronic systolic CHF (congestive heart failure) (HCC) 03/30/2019  . Leg DVT (deep venous thromboembolism), acute, left (HCC) 03/30/2019  . Acute pulmonary embolism without acute cor pulmonale (HCC) 03/30/2019  . Cellulitis of leg, left 03/29/2019  . Chronic cerebrovascular accident (CVA) 07/07/2017  . Essential hypertension, benign 05/24/2017  . Type II diabetes mellitus with stage 3 chronic kidney disease (HCC) 05/24/2017  . Dyslipidemia associated with type 2 diabetes mellitus (HCC) 05/24/2017  . Vascular dementia without behavioral disturbance (HCC) 05/24/2017  . Vitamin B 12 deficiency 05/24/2017  . Rhabdomyolysis 05/19/2017  . AKI (acute kidney injury) (HCC) 05/18/2017  . Elevated troponin 05/18/2017  . History of completed stroke 05/18/2017  . CKD (chronic kidney disease), stage III 05/18/2017  . Physical deconditioning 05/08/2017  . Osteoarthritis 05/08/2017  . Bilateral lower extremity edema   . Abnormal EKG   . Elevated brain natriuretic peptide (BNP) level    PCP:  Mark Apley, MD Pharmacy:   [facility] [phone] [place] Otto, [place] - [address] [place] [address] Phone: [phone] Fax: [phone]     Social Determinants of Health (SDOH) Interventions    Readmission Risk Interventions Readmission Risk Prevention Plan 01/07/2020  Transportation Screening Complete  PCP or Specialist Appt within 3-5 Days Complete  HRI or Home Care Consult Complete  Social Work Consult for Recovery Care Planning/Counseling Complete  Palliative Care Screening Complete  Medication Review Oceanographer) Complete  Some recent data might be hidden

## 2020-01-07 NOTE — Progress Notes (Signed)
Palliative: [name] is lying quietly in bed.  He will briefly make but not keep eye contact.  He continues to appear mistrustful.  He is alert and oriented to self, able to make his basic needs known.  There is no family at bedside at this time.    [name], at times, seems reluctant to speak.  I question if he is experiencing memory loss.  When he does speak, he will often go on tangential thoughts.  I asked [name] if his mouth is still hurting, he states that it does at times.  I encouraged him to use the mouthwash.  We talked about going to rehab.  At this point he states that he would go, but has no preference of place.  We talked about returning to [facility] after rehab.  He asks if this is the first place or the second place, and I share that I do not know.  I share that I will reach out to his daughter, Mark Bullock, to assist with decision-making about rehab.  [name] starts talking about his insurance, and that he is not sure if rehab will be covered.  I reassure him that we would only send him to a rehab that excepted his insurance.  [name] talks about his inability to call his family stating something about his insurance and a telephone where he lives.  Call to daughter, Mark Bullock. No answer, unable to leave VM message.  Will continue to reach out.    Conference with attending, bedside nursing staff, and transition of care team related to patient condition, needs, goals of care.  Recommending outpatient palliative to follow.  Plan:   Continue to treat the treatable.  Agreeable to rehab.   Out patient palliative to follow   35 minutes Mark Carmel, NP Palliative medicine team Team phone [phone] Greater than 50% of this time was spent counseling and coordinating care related to the above assessment and plan.

## 2020-01-07 NOTE — Progress Notes (Signed)
PROGRESS NOTE    Mark Bullock  WUJ:[id number] DOB: February 07, 1929 DOA: 01/02/2020 PCP: Burton Apley, MD   Brief Narrative:  86 yo M, admitted 6/25 from nursing facility with AMS. Very poor PO intake x several days per patient, baseline high stool output. Hypotensive in ED with concern for sepsis, received 59ml/kg IVF.    RUS [place] 6/25 > mildly prominent common bile duct without definitive stone. Incidental finding of R renal cyst  CXR 6/25 > chronic asymmetric R hemidiaphragm elevation. No acute abnormality  Renal US- could not see left kidney, right kidney no obvious pyelo.  Blood culture x2 01/02/2020 urine grew Klebsiella.  Urine culture grew E. coli on 01/02/2020.  Assessment & Plan:   Principal Problem:   Sepsis (HCC) Active Problems:   Bilateral lower extremity edema   Physical deconditioning   AKI (acute kidney injury) (HCC)   Essential hypertension, benign   Dyslipidemia associated with type 2 diabetes mellitus (HCC)   Chronic systolic CHF (congestive heart failure) (HCC)   High anion gap metabolic acidosis   Elevated liver enzymes   Total bilirubin, elevated   Thrombocytopenia (HCC)   Acute hypoxemic respiratory failure (HCC)   Acute lower UTI   DVT (deep venous thrombosis) (HCC)   Septic shock (HCC)   Pressure injury of skin   Palliative care by specialist  GNR Septic Shock: Urine culture growing E. coli which is pansensitive.  Blood culture from 01/02/2020 growing Klebsiella which is also sensitive to everything but ampicillin.  Patient off pressors for more than 24 hours.  Blood pressure borderline low.  Continue cefazolin and gentle IV hydration.  Abdominal Distension with Hyperbilirubinemia and Mild Transaminitis/shock liver: LFTs back to normal.  Acute Metabolic Encephalopathy-improved Hx CVA Suspect dementia Avoid sedating medications as able Treat underlying sepsis, improving  AKI on CKD III- FeNa c/w pre renal, possible component cardiorenal given  echo findings of low ejection fraction.  Initially with anion gap metabolic acidosis.  CO2 improved.  Anion gap closed.  Creatinine improving but slowly.  Peaked at 3.71 on 01/03/2020.  He is net positive since admission.  Avoid nephrotoxic agents.  Continue gentle IV hydration.  Consider nephrology consult if no improvement.  Anemia / Thrombocytopenia:  Likely due to sepsis, no schistocytes seen on DIC panel Hold any anticoagulation.  Hemoglobin and platelets are stable. Follow CBC  Faint Expiratory Wheeze (6/25), resolved Albuterol available as needed  Acute on Chronic Systolic Heart Failure, LVEF 35-40% 09/2019 ? Hx of Afib   Hx HTN, PE/ DVT  Following telemetry Eliquis currently on hold given thrombocytopenia.  BPH: Continue Flomax.  Worsening multiorgan dysfunction in frail elderly SNF patient- Palliative consulted.  DVT prophylaxis: SCDs Start: 01/02/20 1551   Code Status: Full Code  Family Communication:  None present at bedside.  Patient alert.  Palliative care on board for family communication.  Status is: Inpatient  Remains inpatient appropriate because:Inpatient level of care appropriate due to severity of illness   Dispo: The patient is from: SNF              Anticipated d/c is to: SNF              Anticipated d/c date is: 2 days              Patient currently is medically stable to d/c.        Estimated body mass index is 30.28 kg/m as calculated from the following:   Height as of this encounter:  (1.702  m).   Weight as of this encounter: 87.7 kg.  Pressure Injury 01/02/20 Sacrum Medial Stage 2 -  Partial thickness loss of dermis presenting as a shallow open injury with a red, pink wound bed without slough. (Active)  01/02/20 1706  Location: Sacrum  Location Orientation: Medial  Staging: Stage 2 -  Partial thickness loss of dermis presenting as a shallow open injury with a red, pink wound bed without slough.  Wound Description (Comments):    Present on Admission: Yes     Nutritional status:               Consultants:   None  Procedures:   None  Antimicrobials:  Anti-infectives (From admission, onward)   Start     Dose/Rate Route Frequency Ordered Stop   01/05/20 2330  ceFAZolin (ANCEF) IVPB 2g/100 mL premix     Discontinue     2 g 200 mL/hr over 30 Minutes Intravenous Every 12 hours 01/05/20 1341 01/12/20 2359   01/03/20 1200  ceFEPIme (MAXIPIME) 2 g in sodium chloride 0.9 % 100 mL IVPB  Status:  Discontinued        2 g 200 mL/hr over 30 Minutes Intravenous Every 24 hours 01/02/20 1134 01/05/20 1341   01/03/20 1000  azithromycin (ZITHROMAX) tablet 250 mg  Status:  Discontinued       "Followed by" Linked Group Details   250 mg Oral Daily 01/02/20 1744 01/03/20 0829   01/02/20 1745  azithromycin (ZITHROMAX) tablet 500 mg       "Followed by" Linked Group Details   500 mg Oral Daily 01/02/20 1744 01/02/20 1806   01/02/20 1132  vancomycin variable dose per unstable renal function (pharmacist dosing)  Status:  Discontinued         Does not apply See admin instructions 01/02/20 1132 01/03/20 0838   01/02/20 1000  vancomycin (VANCOREADY) IVPB 1500 mg/300 mL        1,500 mg 150 mL/hr over 120 Minutes Intravenous  Once 01/02/20 0952 01/02/20 1304   01/02/20 1000  ceFEPIme (MAXIPIME) 2 g in sodium chloride 0.9 % 100 mL IVPB        2 g 200 mL/hr over 30 Minutes Intravenous  Once 01/02/20 0952 01/02/20 1351   01/02/20 0945  metroNIDAZOLE (FLAGYL) IVPB 500 mg        500 mg 100 mL/hr over 60 Minutes Intravenous  Once 01/02/20 0943 01/02/20 1055         Subjective: Seen and examined.  He has no complaints.  He is once again angry for reasons not known to me.  Perhaps this is his personality.  Objective: Vitals:   01/07/20 0054 01/07/20 0433 01/07/20 0743 01/07/20 1200  BP: 103/62 113/71 106/65 107/66  Pulse: 76 72 72 72  Resp: 19 15 20 20   Temp: 98 F (36.7 C) 98 F (36.7 C)  97.9 F (36.6 C)   TempSrc: Oral Oral  Oral  SpO2: 97% 98% 99% 99%  Weight: 87.7 kg 87.7 kg    Height: 5\' 7"  (1.702 m)       Intake/Output Summary (Last 24 hours) at 01/07/2020 1236 Last data filed at 01/07/2020 0000 Gross per 24 hour  Intake 798.96 ml  Output 1200 ml  Net -401.04 ml   Filed Weights   01/06/20 0500 01/07/20 0054 01/07/20 0433  Weight: 88 kg 87.7 kg 87.7 kg    Examination:  General exam: Appears comfortable but angry Respiratory system: Clear to auscultation. Respiratory effort normal. Cardiovascular system:  S1 & S2 heard, RRR. No JVD, murmurs, rubs, gallops or clicks. No pedal edema. Gastrointestinal system: Abdomen is nondistended, soft and nontender. No organomegaly or masses felt. Normal bowel sounds heard. Central nervous system: Alert and oriented. No focal neurological deficits. Extremities: Symmetric 5 x 5 power. Skin: No rashes, lesions or ulcers.  Psychiatry: Judgement and insight appear poor. Mood & affect angry.  Data Reviewed: I have personally reviewed following labs and imaging studies  CBC: Recent Labs  Lab 01/02/20 1047 01/02/20 1047 01/02/20 1557 01/02/20 1557 01/03/20 0404 01/04/20 0350 01/04/20 1028 01/06/20 0331 01/07/20 0231  WBC 25.2*   < > 20.7*  --  30.0* 20.4*  --  9.9 10.0  NEUTROABS 23.4*  --   --   --   --   --   --   --  6.9  HGB 12.4*   < > 10.9*  --  11.5* 11.0*  --  11.3* 11.0*  HCT 38.1*   < > 33.9*  --  34.2* 31.7*  --  33.0* 32.1*  MCV 86.4   < > 86.7  --  84.2 81.7  --  80.9 80.5  PLT 110*   < > 93*   < > 53* 39* 34* 41* 99*   < > = values in this interval not displayed.   Basic Metabolic Panel: Recent Labs  Lab 01/02/20 1425 01/02/20 1557 01/03/20 0404 01/04/20 0350 01/05/20 1036 01/06/20 0331 01/07/20 0231  NA  --   --  135 136 135 137 135  K  --   --  4.1 4.0 3.8 3.8 3.7  CL  --   --  103 104 104 106 107  CO2  --   --  17* 22 20* 21* 19*  GLUCOSE  --   --  96 116* 108* 111* 106*  BUN  --   --  48* 55* 58* 58* 53*   CREATININE  --    < > 3.71* 3.61* 3.42* 3.17* 2.98*  CALCIUM  --   --  8.0* 8.0* 8.0* 8.3* 8.1*  MG 1.2*  --   --   --   --  2.1  --   PHOS 2.8  --   --   --   --   --   --    < > = values in this interval not displayed.   GFR: Estimated Creatinine Clearance: 17.8 mL/min (A) (by C-G formula based on SCr of 2.98 mg/dL (H)). Liver Function Tests: Recent Labs  Lab 01/02/20 1047 01/03/20 0404 01/04/20 0350 01/05/20 1036  AST 100* 59* 32 26  ALT 130* 89* 52* 40  ALKPHOS 210* 158* 121 180*  BILITOT 6.1* 7.0* 4.7* 4.8*  PROT 6.2* 5.7* 5.1* 5.2*  ALBUMIN 2.6* 2.3* 1.9* 1.7*   Recent Labs  Lab 01/02/20 1557  LIPASE 18  AMYLASE 175*   Recent Labs  Lab 01/02/20 1339  AMMONIA 34   Coagulation Profile: Recent Labs  Lab 01/02/20 1047 01/03/20 0404 01/04/20 1028  INR 2.9* 2.1* 1.5*   Cardiac Enzymes: No results for input(s): CKTOTAL, CKMB, CKMBINDEX, TROPONINI in the last 168 hours. BNP (last 3 results) No results for input(s): PROBNP in the last 8760 hours. HbA1C: No results for input(s): HGBA1C in the last 72 hours. CBG: Recent Labs  Lab 01/06/20 1504 01/06/20 1937 01/06/20 2348 01/07/20 0625 01/07/20 1232  GLUCAP 154* 113* 112* 94 130*   Lipid Profile: No results for input(s): CHOL, HDL, LDLCALC, TRIG, CHOLHDL, LDLDIRECT in the  last 72 hours. Thyroid Function Tests: No results for input(s): TSH, T4TOTAL, FREET4, T3FREE, THYROIDAB in the last 72 hours. Anemia Panel: No results for input(s): VITAMINB12, FOLATE, FERRITIN, TIBC, IRON, RETICCTPCT in the last 72 hours. Sepsis Labs: Recent Labs  Lab 01/02/20 1011 01/02/20 1340 01/02/20 1425  PROCALCITON  --   --  >150.00  LATICACIDVEN 6.1* 3.5*  --     Recent Results (from the past 240 hour(s))  Blood Culture (routine x 2)     Status: Abnormal   Collection Time: 01/02/20  9:37 AM   Specimen: BLOOD  Result Value Ref Range Status   Specimen Description BLOOD RIGHT ANTECUBITAL  Final   Special Requests    Final    BOTTLES DRAWN AEROBIC AND ANAEROBIC Blood Culture adequate volume   Culture  Setup Time   Final    IN BOTH AEROBIC AND ANAEROBIC BOTTLES GRAM NEGATIVE RODS CRITICAL RESULT CALLED TO, READ BACK BY AND VERIFIED WITH: PHRMD V BRYK @0414  01/03/20 BY S GEZAHEGN    Culture KLEBSIELLA PNEUMONIAE (A)  Final   Report Status 01/05/2020 FINAL  Final   Organism ID, Bacteria KLEBSIELLA PNEUMONIAE  Final      Susceptibility   Klebsiella pneumoniae - MIC*    AMPICILLIN RESISTANT Resistant     CEFAZOLIN <=4 SENSITIVE Sensitive     CEFEPIME <=0.12 SENSITIVE Sensitive     CEFTAZIDIME <=1 SENSITIVE Sensitive     CEFTRIAXONE <=0.25 SENSITIVE Sensitive     CIPROFLOXACIN <=0.25 SENSITIVE Sensitive     GENTAMICIN <=1 SENSITIVE Sensitive     IMIPENEM <=0.25 SENSITIVE Sensitive     TRIMETH/SULFA <=20 SENSITIVE Sensitive     AMPICILLIN/SULBACTAM <=2 SENSITIVE Sensitive     PIP/TAZO Value in next row Sensitive      <=4 SENSITIVEPerformed at [facility], [address] [place], [place]    * KLEBSIELLA PNEUMONIAE  Blood Culture ID Panel (Reflexed)     Status: Abnormal   Collection Time: 01/02/20  9:37 AM  Result Value Ref Range Status   Enterococcus species NOT DETECTED NOT DETECTED Final   Listeria monocytogenes NOT DETECTED NOT DETECTED Final   Staphylococcus species NOT DETECTED NOT DETECTED Final   Staphylococcus aureus (BCID) NOT DETECTED NOT DETECTED Final   Streptococcus species NOT DETECTED NOT DETECTED Final   Streptococcus agalactiae NOT DETECTED NOT DETECTED Final   Streptococcus pneumoniae NOT DETECTED NOT DETECTED Final   Streptococcus pyogenes NOT DETECTED NOT DETECTED Final   Acinetobacter baumannii NOT DETECTED NOT DETECTED Final   Enterobacteriaceae species DETECTED (A) NOT DETECTED Final    Comment: Enterobacteriaceae represent a large family of gram-negative bacteria, not a single organism. CRITICAL RESULT CALLED TO, READ BACK BY AND VERIFIED WITH: PHRMD V BRYK  @0414  01/03/20 BY S GEZAHEGN    Enterobacter cloacae complex NOT DETECTED NOT DETECTED Final   Escherichia coli NOT DETECTED NOT DETECTED Final   Klebsiella oxytoca NOT DETECTED NOT DETECTED Final   Klebsiella pneumoniae DETECTED (A) NOT DETECTED Final    Comment: CRITICAL RESULT CALLED TO, READ BACK BY AND VERIFIED WITH: PHRMD V BRYK @0414  01/03/20 BY S GEZAHEGN    Proteus species NOT DETECTED NOT DETECTED Final   Serratia marcescens NOT DETECTED NOT DETECTED Final   Carbapenem resistance NOT DETECTED NOT DETECTED Final   Haemophilus influenzae NOT DETECTED NOT DETECTED Final   Neisseria meningitidis NOT DETECTED NOT DETECTED Final   Pseudomonas aeruginosa NOT DETECTED NOT DETECTED Final   Candida albicans NOT DETECTED NOT DETECTED Final  Candida glabrata NOT DETECTED NOT DETECTED Final   Candida krusei NOT DETECTED NOT DETECTED Final   Candida parapsilosis NOT DETECTED NOT DETECTED Final   Candida tropicalis NOT DETECTED NOT DETECTED Final    Comment: Performed at [facility], [address] [place], [place] [address]  SARS Coronavirus 2 by RT PCR (hospital order, performed in [facility] hospital lab) Nasopharyngeal Nasopharyngeal Swab     Status: None   Collection Time: 01/02/20  9:45 AM   Specimen: Nasopharyngeal Swab  Result Value Ref Range Status   SARS Coronavirus 2 NEGATIVE NEGATIVE Final    Comment: (NOTE) SARS-CoV-2 target nucleic acids are NOT DETECTED.  The SARS-CoV-2 RNA is generally detectable in upper and lower respiratory specimens during the acute phase of infection. The lowest concentration of SARS-CoV-2 viral copies this assay can detect is 250 copies / mL. A negative result does not preclude SARS-CoV-2 infection and should not be used as the sole basis for treatment or other patient management decisions.  A negative result may occur with improper specimen collection / handling, submission of specimen other than nasopharyngeal swab, presence of viral  mutation(s) within the areas targeted by this assay, and inadequate number of viral copies (<250 copies / mL). A negative result must be combined with clinical observations, patient history, and epidemiological information.  Fact Sheet for Patients:   BoilerBrush.com.cy  Fact Sheet for Healthcare Providers: https://pope.com/  This test is not yet approved or  cleared by the [place] FDA and has been authorized for detection and/or diagnosis of SARS-CoV-2 by FDA under an Emergency Use Authorization (EUA).  This EUA will remain in effect (meaning this test can be used) for the duration of the COVID-19 declaration under Section 564(b)(1) of the Act, 21 U.S.C. section 360bbb-3(b)(1), unless the authorization is terminated or revoked sooner.  Performed at [facility], [address] [place], [place] [address]   Blood Culture (routine x 2)     Status: Abnormal   Collection Time: 01/02/20  9:46 AM   Specimen: BLOOD LEFT FOREARM  Result Value Ref Range Status   Specimen Description BLOOD LEFT FOREARM  Final   Special Requests   Final    BOTTLES DRAWN AEROBIC AND ANAEROBIC Blood Culture results may not be optimal due to an inadequate volume of blood received in culture bottles   Culture  Setup Time   Final    IN BOTH AEROBIC AND ANAEROBIC BOTTLES GRAM NEGATIVE RODS CRITICAL VALUE NOTED.  VALUE IS CONSISTENT WITH PREVIOUSLY REPORTED AND CALLED VALUE.    Culture (A)  Final    KLEBSIELLA PNEUMONIAE SUSCEPTIBILITIES PERFORMED ON PREVIOUS CULTURE WITHIN THE LAST 5 DAYS. Performed at [facility], [address] [place], [place] [address]    Report Status 01/05/2020 FINAL  Final  Urine culture     Status: Abnormal   Collection Time: 01/02/20 10:05 AM   Specimen: In/Out Cath Urine  Result Value Ref Range Status   Specimen Description IN/OUT CATH URINE  Final   Special Requests   Final    NONE Performed at [facility], [address], [place], [place] [address]    Culture >=100,000 COLONIES/mL ESCHERICHIA COLI (A)  Final   Report Status 01/04/2020 FINAL  Final   Organism ID, Bacteria ESCHERICHIA COLI (A)  Final      Susceptibility   Escherichia coli - MIC*    AMPICILLIN 4 SENSITIVE Sensitive     CEFAZOLIN <=4 SENSITIVE Sensitive  CEFTRIAXONE <=0.25 SENSITIVE Sensitive     CIPROFLOXACIN <=0.25 SENSITIVE Sensitive     GENTAMICIN <=1 SENSITIVE Sensitive     IMIPENEM <=0.25 SENSITIVE Sensitive     NITROFURANTOIN <=16 SENSITIVE Sensitive     TRIMETH/SULFA <=20 SENSITIVE Sensitive     AMPICILLIN/SULBACTAM <=2 SENSITIVE Sensitive     PIP/TAZO <=4 SENSITIVE Sensitive     * >=100,000 COLONIES/mL ESCHERICHIA COLI  MRSA PCR Screening     Status: None   Collection Time: 01/02/20  4:44 PM   Specimen: Nasal Mucosa; Nasopharyngeal  Result Value Ref Range Status   MRSA by PCR NEGATIVE NEGATIVE Final    Comment:        The GeneXpert MRSA Assay (FDA approved for NASAL specimens only), is one component of a comprehensive MRSA colonization surveillance program. It is not intended to diagnose MRSA infection nor to guide or monitor treatment for MRSA infections. Performed at [facility], [address] [place], [place] [address]       Radiology Studies: No results found.  Scheduled Meds: . Chlorhexidine Gluconate Cloth  6 each Topical Daily  . guaiFENesin  600 mg Oral BID  . mouth rinse  15 mL Mouth Rinse BID  . midodrine  5 mg Oral TID  . tamsulosin  0.4 mg Oral QHS   Continuous Infusions: . sodium chloride Stopped (01/05/20 1649)  . sodium chloride 75 mL/hr at 01/06/20 1800  .  ceFAZolin (ANCEF) IV 2 g (01/07/20 1008)     LOS: 5 days   Time spent: 28 minutes   Hughie Clossavi Rheda Kassab, MD [facility]  01/07/2020, 12:36 PM   To contact the attending provider between 7A-7P or the covering provider during after hours 7P-7A, please log into the web site [email or web address].

## 2020-01-07 NOTE — Plan of Care (Signed)
  Problem: Education: Goal: Knowledge of General Education information will improve Description: Including pain rating scale, medication(s)/side effects and non-pharmacologic comfort measures Outcome: Progressing   Problem: Clinical Measurements: Goal: Ability to maintain clinical measurements within normal limits will improve Outcome: Progressing Goal: Diagnostic test results will improve Outcome: Progressing Goal: Cardiovascular complication will be avoided Outcome: Progressing   

## 2020-01-08 LAB — BASIC METABOLIC PANEL
Anion gap: 9 (ref 5–15)
BUN: 50 mg/dL — ABNORMAL HIGH (ref 8–23)
CO2: 21 mmol/L — ABNORMAL LOW (ref 22–32)
Calcium: 8 mg/dL — ABNORMAL LOW (ref 8.9–10.3)
Chloride: 107 mmol/L (ref 98–111)
Creatinine, Ser: 2.73 mg/dL — ABNORMAL HIGH (ref 0.61–1.24)
GFR calc Af Amer: 23 mL/min — ABNORMAL LOW (ref >60)
GFR calc non Af Amer: 20 mL/min — ABNORMAL LOW (ref >60)
Glucose, Bld: 118 mg/dL — ABNORMAL HIGH (ref 70–99)
Potassium: 4.7 mmol/L (ref 3.5–5.1)
Sodium: 137 mmol/L (ref 135–145)

## 2020-01-08 LAB — CBC WITH DIFFERENTIAL/PLATELET
Abs Immature Granulocytes: 1.08 10*3/uL — ABNORMAL HIGH (ref 0.00–0.07)
Basophils Absolute: 0.1 10*3/uL (ref 0.0–0.1)
Basophils Relative: 1 %
Eosinophils Absolute: 0.1 10*3/uL (ref 0.0–0.5)
Eosinophils Relative: 1 %
HCT: 30.3 % — ABNORMAL LOW (ref 39.0–52.0)
Hemoglobin: 10.4 g/dL — ABNORMAL LOW (ref 13.0–17.0)
Immature Granulocytes: 11 %
Lymphocytes Relative: 10 %
Lymphs Abs: 1 10*3/uL (ref 0.7–4.0)
MCH: 28.2 pg (ref 26.0–34.0)
MCHC: 34.3 g/dL (ref 30.0–36.0)
MCV: 82.1 fL (ref 80.0–100.0)
Monocytes Absolute: 0.9 10*3/uL (ref 0.1–1.0)
Monocytes Relative: 9 %
Neutro Abs: 7 10*3/uL (ref 1.7–7.7)
Neutrophils Relative %: 68 %
Platelets: 173 10*3/uL (ref 150–400)
RBC: 3.69 MIL/uL — ABNORMAL LOW (ref 4.22–5.81)
RDW: 15.7 % — ABNORMAL HIGH (ref 11.5–15.5)
WBC: 10.2 10*3/uL (ref 4.0–10.5)
nRBC: 0 % (ref 0.0–0.2)

## 2020-01-08 LAB — GLUCOSE, CAPILLARY
Glucose-Capillary: 106 mg/dL — ABNORMAL HIGH (ref 70–99)
Glucose-Capillary: 126 mg/dL — ABNORMAL HIGH (ref 70–99)
Glucose-Capillary: 91 mg/dL (ref 70–99)
Glucose-Capillary: 82 mg/dL (ref 70–99)

## 2020-01-08 MED ORDER — APIXABAN 5 MG PO TABS
5.0000 mg | ORAL_TABLET | Freq: Two times a day (BID) | ORAL | Status: DC
  Administered 2020-01-08 – 2020-01-09 (×3): 5 mg via ORAL
  Filled 2020-01-08 (×3): qty 1

## 2020-01-08 MED ORDER — MIDODRINE HCL 5 MG PO TABS
10.0000 mg | ORAL_TABLET | Freq: Three times a day (TID) | ORAL | Status: DC
  Administered 2020-01-08 – 2020-01-09 (×3): 10 mg via ORAL
  Filled 2020-01-08 (×3): qty 2

## 2020-01-08 NOTE — Plan of Care (Signed)

## 2020-01-08 NOTE — Progress Notes (Signed)
PROGRESS NOTE    Mark Bullock  LPF:[id number] DOB: Jul 16, 1928 DOA: 01/02/2020 PCP: Burton Apley, MD   Brief Narrative:  86 yo M, admitted 6/25 from nursing facility with AMS. Very poor PO intake x several days per patient, baseline high stool output. Hypotensive in ED with concern for sepsis, received 52ml/kg IVF.    RUS [place] 6/25 > mildly prominent common bile duct without definitive stone. Incidental finding of R renal cyst  CXR 6/25 > chronic asymmetric R hemidiaphragm elevation. No acute abnormality  Renal US- could not see left kidney, right kidney no obvious pyelo.  Blood culture x2 01/02/2020 urine grew Klebsiella.  Urine culture grew E. coli on 01/02/2020.  Assessment & Plan:   Principal Problem:   Sepsis (HCC) Active Problems:   Bilateral lower extremity edema   Physical deconditioning   AKI (acute kidney injury) (HCC)   Essential hypertension, benign   Dyslipidemia associated with type 2 diabetes mellitus (HCC)   Chronic systolic CHF (congestive heart failure) (HCC)   High anion gap metabolic acidosis   Elevated liver enzymes   Total bilirubin, elevated   Thrombocytopenia (HCC)   Acute hypoxemic respiratory failure (HCC)   Acute lower UTI   DVT (deep venous thrombosis) (HCC)   Septic shock (HCC)   Pressure injury of skin   Palliative care by specialist  GNR Septic Shock: Urine culture growing E. coli which is pansensitive.  Blood culture from 01/02/2020 growing Klebsiella which is also sensitive to everything but ampicillin.  Patient off pressors for more than 3 days.  Blood pressure borderline low.  Continue cefazolin and increase midodrine to 10 mg 3 times daily.  Abdominal Distension with Hyperbilirubinemia and Mild Transaminitis/shock liver: LFTs back to normal.  Acute Metabolic Encephalopathy-improved Hx CVA Suspect dementia Avoid sedating medications as able Treat underlying sepsis, improving  AKI on CKD III- FeNa c/w pre renal, possible component  cardiorenal given echo findings of low ejection fraction.  Initially with anion gap metabolic acidosis.  CO2 improved.  Anion gap closed. Peaked at 3.71 on 01/03/2020.  He is net positive since admission.  Avoid nephrotoxic agents.  Stop IV fluids.  Creatinine improving but very slowly.  Likely due to ATN.  Blood pressure still on the low normal side.  Discussed with nephrology.  Will increase midodrine to 10 mg 3 times daily.  Anemia / Thrombocytopenia:  Likely due to sepsis, no schistocytes seen on DIC panel Hemoglobin remained stable.  Thrombocytopenia resolved.  Resume home dose of Eliquis.  Faint Expiratory Wheeze (6/25), resolved Albuterol available as needed  Acute on Chronic Systolic Heart Failure, LVEF 35-40% 09/2019 ? Hx of Afib   Hx HTN, PE/ DVT  Following telemetry Eliquis currently on hold given thrombocytopenia.  BPH: Continue Flomax.  Worsening multiorgan dysfunction in frail elderly SNF patient- Palliative consulted.  DVT prophylaxis: SCDs Start: 01/02/20 1551   Code Status: Full Code  Family Communication:  None present at bedside.  Patient alert.  Palliative care on board for family communication.  Status is: Inpatient  Remains inpatient appropriate because:Inpatient level of care appropriate due to severity of illness   Dispo: The patient is from: SNF              Anticipated d/c is to: SNF              Anticipated d/c date is: 01/09/2020 as long as we see improvement in creatinine.              Patient currently is  medically stable to d/c however would like to keep him overnight with increasing dose of midodrine with the hope that his blood pressure will improve further and that will help with improvement in creatinine.        Estimated body mass index is 29.83 kg/m as calculated from the following:   Height as of this encounter:  (1.702 m).   Weight as of this encounter: 86.4 kg.  Pressure Injury 01/02/20 Sacrum Medial Stage 2 -  Partial  thickness loss of dermis presenting as a shallow open injury with a red, pink wound bed without slough. (Active)  01/02/20 1706  Location: Sacrum  Location Orientation: Medial  Staging: Stage 2 -  Partial thickness loss of dermis presenting as a shallow open injury with a red, pink wound bed without slough.  Wound Description (Comments):   Present on Admission: Yes     Nutritional status:               Consultants:   None  Procedures:   None  Antimicrobials:  Anti-infectives (From admission, onward)   Start     Dose/Rate Route Frequency Ordered Stop   01/05/20 2330  ceFAZolin (ANCEF) IVPB 2g/100 mL premix     Discontinue     2 g 200 mL/hr over 30 Minutes Intravenous Every 12 hours 01/05/20 1341 01/12/20 2359   01/03/20 1200  ceFEPIme (MAXIPIME) 2 g in sodium chloride 0.9 % 100 mL IVPB  Status:  Discontinued        2 g 200 mL/hr over 30 Minutes Intravenous Every 24 hours 01/02/20 1134 01/05/20 1341   01/03/20 1000  azithromycin (ZITHROMAX) tablet 250 mg  Status:  Discontinued       "Followed by" Linked Group Details   250 mg Oral Daily 01/02/20 1744 01/03/20 0829   01/02/20 1745  azithromycin (ZITHROMAX) tablet 500 mg       "Followed by" Linked Group Details   500 mg Oral Daily 01/02/20 1744 01/02/20 1806   01/02/20 1132  vancomycin variable dose per unstable renal function (pharmacist dosing)  Status:  Discontinued         Does not apply See admin instructions 01/02/20 1132 01/03/20 0838   01/02/20 1000  vancomycin (VANCOREADY) IVPB 1500 mg/300 mL        1,500 mg 150 mL/hr over 120 Minutes Intravenous  Once 01/02/20 0952 01/02/20 1304   01/02/20 1000  ceFEPIme (MAXIPIME) 2 g in sodium chloride 0.9 % 100 mL IVPB        2 g 200 mL/hr over 30 Minutes Intravenous  Once 01/02/20 0952 01/02/20 1351   01/02/20 0945  metroNIDAZOLE (FLAGYL) IVPB 500 mg        500 mg 100 mL/hr over 60 Minutes Intravenous  Once 01/02/20 0943 01/02/20 1055         Subjective: Seen  and examined.  No complaints.  Mood is much better today.  Objective: Vitals:   01/08/20 0345 01/08/20 0630 01/08/20 0744 01/08/20 1121  BP: 108/64  108/64 108/66  Pulse: 70  73 73  Resp: Temp: 98 F (36.7 C)  98 F (36.7 C) 98.4 F (36.9 C)  TempSrc: Oral  Oral Oral  SpO2: 97%  95% 95%  Weight:  86.4 kg    Height:        Intake/Output Summary (Last 24 hours) at 01/08/2020 1248 Last data filed at 01/08/2020 0900 Gross per 24 hour  Intake 320 ml  Output 1125 ml  Net -805 ml   Filed Weights   01/07/20 0054 01/07/20 0433 01/08/20 0630  Weight: 87.7 kg 87.7 kg 86.4 kg    Examination:  General exam: Appears calm and comfortable  Respiratory system: Clear to auscultation. Respiratory effort normal. Cardiovascular system: S1 & S2 heard, RRR. No JVD, murmurs, rubs, gallops or clicks. No pedal edema. Gastrointestinal system: Abdomen is nondistended, soft and nontender. No organomegaly or masses felt. Normal bowel sounds heard. Central nervous system: Alert and oriented. No focal neurological deficits. Extremities: Symmetric 5 x 5 power. Skin: No rashes, lesions or ulcers.  Psychiatry: Judgement and insight appear poor. Mood & affect appropriate.   Data Reviewed: I have personally reviewed following labs and imaging studies  CBC: Recent Labs  Lab 01/02/20 1047 01/02/20 1557 01/03/20 0404 01/03/20 0404 01/04/20 0350 01/04/20 1028 01/06/20 0331 01/07/20 0231 01/08/20 0326  WBC 25.2*   < > 30.0*  --  20.4*  --  9.9 10.0 10.2  NEUTROABS 23.4*  --   --   --   --   --   --  6.9 7.0  HGB 12.4*   < > 11.5*  --  11.0*  --  11.3* 11.0* 10.4*  HCT 38.1*   < > 34.2*  --  31.7*  --  33.0* 32.1* 30.3*  MCV 86.4   < > 84.2  --  81.7  --  80.9 80.5 82.1  PLT 110*   < > 53*   < > 39* 34* 41* 99* 173   < > = values in this interval not displayed.   Basic Metabolic Panel: Recent Labs  Lab 01/02/20 1425 01/02/20 1557 01/04/20 0350 01/05/20 1036 01/06/20 0331  01/07/20 0231 01/08/20 0326  NA  --    < > 136 135 137 135 137  K  --    < > 4.0 3.8 3.8 3.7 4.7  CL  --    < > 104 104 106 107 107  CO2  --    < > 22 20* 21* 19* 21*  GLUCOSE  --    < > 116* 108* 111* 106* 118*  BUN  --    < > 55* 58* 58* 53* 50*  CREATININE  --    < > 3.61* 3.42* 3.17* 2.98* 2.73*  CALCIUM  --    < > 8.0* 8.0* 8.3* 8.1* 8.0*  MG 1.2*  --   --   --  2.1  --   --   PHOS 2.8  --   --   --   --   --   --    < > = values in this interval not displayed.   GFR: Estimated Creatinine Clearance: 19.3 mL/min (A) (by C-G formula based on SCr of 2.73 mg/dL (H)). Liver Function Tests: Recent Labs  Lab 01/02/20 1047 01/03/20 0404 01/04/20 0350 01/05/20 1036  AST 100* 59* 32 26  ALT 130* 89* 52* 40  ALKPHOS 210* 158* 121 180*  BILITOT 6.1* 7.0* 4.7* 4.8*  PROT 6.2* 5.7* 5.1* 5.2*  ALBUMIN 2.6* 2.3* 1.9* 1.7*   Recent Labs  Lab 01/02/20 1557  LIPASE 18  AMYLASE 175*   Recent Labs  Lab 01/02/20 1339  AMMONIA 34   Coagulation Profile: Recent Labs  Lab 01/02/20 1047 01/03/20 0404 01/04/20 1028  INR 2.9* 2.1* 1.5*   Cardiac Enzymes: No results for input(s): CKTOTAL, CKMB, CKMBINDEX, TROPONINI in the last 168 hours. BNP (last 3 results) No results for input(s): PROBNP in the last  8760 hours. HbA1C: No results for input(s): HGBA1C in the last 72 hours. CBG: Recent Labs  Lab 01/07/20 1232 01/07/20 1625 01/07/20 2204 01/08/20 0558 01/08/20 1127  GLUCAP 130* 101* 99 106* 126*   Lipid Profile: No results for input(s): CHOL, HDL, LDLCALC, TRIG, CHOLHDL, LDLDIRECT in the last 72 hours. Thyroid Function Tests: No results for input(s): TSH, T4TOTAL, FREET4, T3FREE, THYROIDAB in the last 72 hours. Anemia Panel: No results for input(s): VITAMINB12, FOLATE, FERRITIN, TIBC, IRON, RETICCTPCT in the last 72 hours. Sepsis Labs: Recent Labs  Lab 01/02/20 1011 01/02/20 1340 01/02/20 1425  PROCALCITON  --   --  >150.00  LATICACIDVEN 6.1* 3.5*  --     Recent  Results (from the past 240 hour(s))  Blood Culture (routine x 2)     Status: Abnormal   Collection Time: 01/02/20  9:37 AM   Specimen: BLOOD  Result Value Ref Range Status   Specimen Description BLOOD RIGHT ANTECUBITAL  Final   Special Requests   Final    BOTTLES DRAWN AEROBIC AND ANAEROBIC Blood Culture adequate volume   Culture  Setup Time   Final    IN BOTH AEROBIC AND ANAEROBIC BOTTLES GRAM NEGATIVE RODS CRITICAL RESULT CALLED TO, READ BACK BY AND VERIFIED WITH: PHRMD V BRYK @0414  01/03/20 BY S GEZAHEGN    Culture KLEBSIELLA PNEUMONIAE (A)  Final   Report Status 01/05/2020 FINAL  Final   Organism ID, Bacteria KLEBSIELLA PNEUMONIAE  Final      Susceptibility   Klebsiella pneumoniae - MIC*    AMPICILLIN RESISTANT Resistant     CEFAZOLIN <=4 SENSITIVE Sensitive     CEFEPIME <=0.12 SENSITIVE Sensitive     CEFTAZIDIME <=1 SENSITIVE Sensitive     CEFTRIAXONE <=0.25 SENSITIVE Sensitive     CIPROFLOXACIN <=0.25 SENSITIVE Sensitive     GENTAMICIN <=1 SENSITIVE Sensitive     IMIPENEM <=0.25 SENSITIVE Sensitive     TRIMETH/SULFA <=20 SENSITIVE Sensitive     AMPICILLIN/SULBACTAM <=2 SENSITIVE Sensitive     PIP/TAZO Value in next row Sensitive      <=4 SENSITIVEPerformed at [facility], [address] [place], [place]    * KLEBSIELLA PNEUMONIAE  Blood Culture ID Panel (Reflexed)     Status: Abnormal   Collection Time: 01/02/20  9:37 AM  Result Value Ref Range Status   Enterococcus species NOT DETECTED NOT DETECTED Final   Listeria monocytogenes NOT DETECTED NOT DETECTED Final   Staphylococcus species NOT DETECTED NOT DETECTED Final   Staphylococcus aureus (BCID) NOT DETECTED NOT DETECTED Final   Streptococcus species NOT DETECTED NOT DETECTED Final   Streptococcus agalactiae NOT DETECTED NOT DETECTED Final   Streptococcus pneumoniae NOT DETECTED NOT DETECTED Final   Streptococcus pyogenes NOT DETECTED NOT DETECTED Final   Acinetobacter baumannii NOT DETECTED NOT  DETECTED Final   Enterobacteriaceae species DETECTED (A) NOT DETECTED Final    Comment: Enterobacteriaceae represent a large family of gram-negative bacteria, not a single organism. CRITICAL RESULT CALLED TO, READ BACK BY AND VERIFIED WITH: PHRMD V BRYK @0414  01/03/20 BY S GEZAHEGN    Enterobacter cloacae complex NOT DETECTED NOT DETECTED Final   Escherichia coli NOT DETECTED NOT DETECTED Final   Klebsiella oxytoca NOT DETECTED NOT DETECTED Final   Klebsiella pneumoniae DETECTED (A) NOT DETECTED Final    Comment: CRITICAL RESULT CALLED TO, READ BACK BY AND VERIFIED WITH: PHRMD V BRYK @0414  01/03/20 BY S GEZAHEGN    Proteus species NOT DETECTED NOT DETECTED Final   Serratia  marcescens NOT DETECTED NOT DETECTED Final   Carbapenem resistance NOT DETECTED NOT DETECTED Final   Haemophilus influenzae NOT DETECTED NOT DETECTED Final   Neisseria meningitidis NOT DETECTED NOT DETECTED Final   Pseudomonas aeruginosa NOT DETECTED NOT DETECTED Final   Candida albicans NOT DETECTED NOT DETECTED Final   Candida glabrata NOT DETECTED NOT DETECTED Final   Candida krusei NOT DETECTED NOT DETECTED Final   Candida parapsilosis NOT DETECTED NOT DETECTED Final   Candida tropicalis NOT DETECTED NOT DETECTED Final    Comment: Performed at [facility], [address] [place], [place] [address]  SARS Coronavirus 2 by RT PCR (hospital order, performed in [facility] hospital lab) Nasopharyngeal Nasopharyngeal Swab     Status: None   Collection Time: 01/02/20  9:45 AM   Specimen: Nasopharyngeal Swab  Result Value Ref Range Status   SARS Coronavirus 2 NEGATIVE NEGATIVE Final    Comment: (NOTE) SARS-CoV-2 target nucleic acids are NOT DETECTED.  The SARS-CoV-2 RNA is generally detectable in upper and lower respiratory specimens during the acute phase of infection. The lowest concentration of SARS-CoV-2 viral copies this assay can detect is 250 copies / mL. A negative result does not preclude SARS-CoV-2  infection and should not be used as the sole basis for treatment or other patient management decisions.  A negative result may occur with improper specimen collection / handling, submission of specimen other than nasopharyngeal swab, presence of viral mutation(s) within the areas targeted by this assay, and inadequate number of viral copies (<250 copies / mL). A negative result must be combined with clinical observations, patient history, and epidemiological information.  Fact Sheet for Patients:   BoilerBrush.com.cy  Fact Sheet for Healthcare Providers: https://pope.com/  This test is not yet approved or  cleared by the [place] FDA and has been authorized for detection and/or diagnosis of SARS-CoV-2 by FDA under an Emergency Use Authorization (EUA).  This EUA will remain in effect (meaning this test can be used) for the duration of the COVID-19 declaration under Section 564(b)(1) of the Act, 21 U.S.C. section 360bbb-3(b)(1), unless the authorization is terminated or revoked sooner.  Performed at [facility], [address], [place], [place] [address]   Blood Culture (routine x 2)     Status: Abnormal   Collection Time: 01/02/20  9:46 AM   Specimen: BLOOD LEFT FOREARM  Result Value Ref Range Status   Specimen Description BLOOD LEFT FOREARM  Final   Special Requests   Final    BOTTLES DRAWN AEROBIC AND ANAEROBIC Blood Culture results may not be optimal due to an inadequate volume of blood received in culture bottles   Culture  Setup Time   Final    IN BOTH AEROBIC AND ANAEROBIC BOTTLES GRAM NEGATIVE RODS CRITICAL VALUE NOTED.  VALUE IS CONSISTENT WITH PREVIOUSLY REPORTED AND CALLED VALUE.    Culture (A)  Final    KLEBSIELLA PNEUMONIAE SUSCEPTIBILITIES PERFORMED ON PREVIOUS CULTURE WITHIN THE LAST 5 DAYS. Performed at [facility], [address] [place], [place] [address]    Report Status 01/05/2020 FINAL   Final  Urine culture     Status: Abnormal   Collection Time: 01/02/20 10:05 AM   Specimen: In/Out Cath Urine  Result Value Ref Range Status   Specimen Description IN/OUT CATH URINE  Final   Special Requests   Final    NONE Performed at [facility], [address] [place], [place] [address]    Culture >=  100,000 COLONIES/mL ESCHERICHIA COLI (A)  Final   Report Status 01/04/2020 FINAL  Final   Organism ID, Bacteria ESCHERICHIA COLI (A)  Final      Susceptibility   Escherichia coli - MIC*    AMPICILLIN 4 SENSITIVE Sensitive     CEFAZOLIN <=4 SENSITIVE Sensitive     CEFTRIAXONE <=0.25 SENSITIVE Sensitive     CIPROFLOXACIN <=0.25 SENSITIVE Sensitive     GENTAMICIN <=1 SENSITIVE Sensitive     IMIPENEM <=0.25 SENSITIVE Sensitive     NITROFURANTOIN <=16 SENSITIVE Sensitive     TRIMETH/SULFA <=20 SENSITIVE Sensitive     AMPICILLIN/SULBACTAM <=2 SENSITIVE Sensitive     PIP/TAZO <=4 SENSITIVE Sensitive     * >=100,000 COLONIES/mL ESCHERICHIA COLI  MRSA PCR Screening     Status: None   Collection Time: 01/02/20  4:44 PM   Specimen: Nasal Mucosa; Nasopharyngeal  Result Value Ref Range Status   MRSA by PCR NEGATIVE NEGATIVE Final    Comment:        The GeneXpert MRSA Assay (FDA approved for NASAL specimens only), is one component of a comprehensive MRSA colonization surveillance program. It is not intended to diagnose MRSA infection nor to guide or monitor treatment for MRSA infections. Performed at [facility], [address], [place], [place] [address]   SARS Coronavirus 2 by RT PCR (hospital order, performed in [facility] hospital lab) Nasopharyngeal Nasopharyngeal Swab     Status: None   Collection Time: 01/07/20  2:41 PM   Specimen: Nasopharyngeal Swab  Result Value Ref Range Status   SARS Coronavirus 2 NEGATIVE NEGATIVE Final    Comment: (NOTE) SARS-CoV-2 target nucleic acids are NOT DETECTED.  The SARS-CoV-2 RNA is generally detectable in upper and  lower respiratory specimens during the acute phase of infection. The lowest concentration of SARS-CoV-2 viral copies this assay can detect is 250 copies / mL. A negative result does not preclude SARS-CoV-2 infection and should not be used as the sole basis for treatment or other patient management decisions.  A negative result may occur with improper specimen collection / handling, submission of specimen other than nasopharyngeal swab, presence of viral mutation(s) within the areas targeted by this assay, and inadequate number of viral copies (<250 copies / mL). A negative result must be combined with clinical observations, patient history, and epidemiological information.  Fact Sheet for Patients:   BoilerBrush.com.cy  Fact Sheet for Healthcare Providers: https://pope.com/  This test is not yet approved or  cleared by the [place] FDA and has been authorized for detection and/or diagnosis of SARS-CoV-2 by FDA under an Emergency Use Authorization (EUA).  This EUA will remain in effect (meaning this test can be used) for the duration of the COVID-19 declaration under Section 564(b)(1) of the Act, 21 U.S.C. section 360bbb-3(b)(1), unless the authorization is terminated or revoked sooner.  Performed at [facility], [address], [place], [place] [address]       Radiology Studies: No results found.  Scheduled Meds: . apixaban  5 mg Oral BID  . Chlorhexidine Gluconate Cloth  6 each Topical Daily  . guaiFENesin  600 mg Oral BID  . mouth rinse  15 mL Mouth Rinse BID  . midodrine  10 mg Oral TID  . tamsulosin  0.4 mg Oral QHS   Continuous Infusions: . sodium chloride Stopped (01/05/20 1649)  .  ceFAZolin (ANCEF) IV 2 g (01/08/20 1157)     LOS: 6 days   Time spent: 29 minutes  Hughie Clossavi Yeng Perz, MD [facility]  01/08/2020, 12:48 PM   To contact the attending provider between 7A-7P or the covering provider  during after hours 7P-7A, please log into the web site [email or web address].

## 2020-01-08 NOTE — TOC Progression Note (Signed)
Transition of Care [facility]) - Progression Note    Patient Details  Name: Mark Bullock MRN: [id number] Date of Birth: 1929-01-11  Transition of Care [facility]) CM/SW Contact  Eduard Roux, [place] Phone Number: 01/08/2020, 11:42 AM  Clinical Narrative:     CSW informed SNF/[facility] patient will not discharge today, anticipates discharge tomorrow.  CSW left voice message with patent's daughter, Para March -not discharging today.  Antony Blackbird, MSW, LCSWA Clinical Social Worker   Expected Discharge Plan: Skilled Nursing Facility Barriers to Discharge: SNF Pending bed offer  Expected Discharge Plan and Services Expected Discharge Plan: Skilled Nursing Facility In-house Referral: NA Discharge Planning Services: CM Consult   Living arrangements for the past 2 months: Independent Living Facility                   DME Agency: NA       HH Arranged: NA           Social Determinants of Health (SDOH) Interventions    Readmission Risk Interventions Readmission Risk Prevention Plan 01/07/2020  Transportation Screening Complete  PCP or Specialist Appt within 3-5 Days Complete  HRI or Home Care Consult Complete  Social Work Consult for Recovery Care Planning/Counseling Complete  Palliative Care Screening Complete  Medication Review Oceanographer) Complete  Some recent data might be hidden

## 2020-01-09 LAB — GLUCOSE, CAPILLARY
Glucose-Capillary: 81 mg/dL (ref 70–99)
Glucose-Capillary: 88 mg/dL (ref 70–99)

## 2020-01-09 LAB — BASIC METABOLIC PANEL
Anion gap: 8 (ref 5–15)
BUN: 42 mg/dL — ABNORMAL HIGH (ref 8–23)
CO2: 21 mmol/L — ABNORMAL LOW (ref 22–32)
Calcium: 8.1 mg/dL — ABNORMAL LOW (ref 8.9–10.3)
Chloride: 110 mmol/L (ref 98–111)
Creatinine, Ser: 2.49 mg/dL — ABNORMAL HIGH (ref 0.61–1.24)
GFR calc Af Amer: 26 mL/min — ABNORMAL LOW (ref >60)
GFR calc non Af Amer: 22 mL/min — ABNORMAL LOW (ref >60)
Glucose, Bld: 96 mg/dL (ref 70–99)
Potassium: 3.8 mmol/L (ref 3.5–5.1)
Sodium: 139 mmol/L (ref 135–145)

## 2020-01-09 LAB — CBC WITH DIFFERENTIAL/PLATELET
Abs Immature Granulocytes: 0.96 10*3/uL — ABNORMAL HIGH (ref 0.00–0.07)
Basophils Absolute: 0.1 10*3/uL (ref 0.0–0.1)
Basophils Relative: 1 %
Eosinophils Absolute: 0.1 10*3/uL (ref 0.0–0.5)
Eosinophils Relative: 1 %
HCT: 29.5 % — ABNORMAL LOW (ref 39.0–52.0)
Hemoglobin: 9.8 g/dL — ABNORMAL LOW (ref 13.0–17.0)
Immature Granulocytes: 9 %
Lymphocytes Relative: 12 %
Lymphs Abs: 1.3 10*3/uL (ref 0.7–4.0)
MCH: 27.5 pg (ref 26.0–34.0)
MCHC: 33.2 g/dL (ref 30.0–36.0)
MCV: 82.9 fL (ref 80.0–100.0)
Monocytes Absolute: 1.1 10*3/uL — ABNORMAL HIGH (ref 0.1–1.0)
Monocytes Relative: 11 %
Neutro Abs: 7 10*3/uL (ref 1.7–7.7)
Neutrophils Relative %: 66 %
Platelets: 223 10*3/uL (ref 150–400)
RBC: 3.56 MIL/uL — ABNORMAL LOW (ref 4.22–5.81)
RDW: 15.5 % (ref 11.5–15.5)
WBC: 10.6 10*3/uL — ABNORMAL HIGH (ref 4.0–10.5)
nRBC: 0 % (ref 0.0–0.2)

## 2020-01-09 MED ORDER — CEFUROXIME AXETIL 500 MG PO TABS: 500.0000 mg | ORAL_TABLET | Freq: Two times a day (BID) | ORAL | 0 refills | Status: AC

## 2020-01-09 MED ORDER — MIDODRINE HCL 10 MG PO TABS: 10.0000 mg | ORAL_TABLET | Freq: Three times a day (TID) | ORAL | 0 refills | Status: AC

## 2020-01-09 NOTE — TOC Transition Note (Signed)
Transition of Care [facility]) - CM/SW Discharge Note   Patient Details  Name: Mark Bullock MRN: [id number] Date of Birth: 1929-03-14  Transition of Care [facility]) CM/SW Contact:  Eduard Roux, LCSWA Phone Number: 01/09/2020, 11:54 AM   Clinical Narrative:     Patient will DC to: [name]  DC Date: 01/09/2020 Family Notified: Jeanette,daughter Transport [id number]   Per MD patient is ready for discharge. RN, patient, and facility notified of DC. Discharge Summary sent to facility. RN given number for [phone], Room 703-P. Ambulance transport requested for patient.   Clinical Social Worker signing off.  Antony Blackbird, MSW, LCSWA Clinical Social Worker    Final next level of care: Skilled Nursing Facility Barriers to Discharge: Barriers Resolved   Patient Goals and CMS Choice Patient states their goals for this hospitalization and ongoing recovery are:: get better CMS Medicare.gov Compare Post Acute Care list provided to:: Patient Represenative (must comment) Choice offered to / list presented to : Adult Children (preference [name])  Discharge Placement              Patient chooses bed at: [facility] Patient to be transferred to facility by: [facility] Name of family member notified: Jeanette,daughter Patient and family notified of of transfer: 01/09/20  Discharge Plan and Services In-house Referral: NA Discharge Planning Services: CM Consult              DME Agency: NA       HH Arranged: NA          Social Determinants of Health (SDOH) Interventions     Readmission Risk Interventions Readmission Risk Prevention Plan 01/07/2020  Transportation Screening Complete  PCP or Specialist Appt within 3-5 Days Complete  HRI or Home Care Consult Complete  Social Work Consult for Recovery Care Planning/Counseling Complete  Palliative Care Screening Complete  Medication Review Oceanographer) Complete  Some recent data might be hidden

## 2020-01-09 NOTE — Plan of Care (Signed)

## 2020-01-09 NOTE — Discharge Summary (Addendum)
Physician Discharge Summary  Mark Bullock DOB: [birth year] DOA: 01/02/2020  PCP: Mark Apley, MD  Admit date: 01/02/2020 Discharge date: 01/09/2020  Admitted From: Mark Bullock Disposition: [name]  Recommendations for Outpatient Follow-up:  1. Follow up with PCP in 1-2 weeks 2. Please obtain BMP/CBC in one week 3. Please follow up with your PCP on the following pending results: Unresulted Labs (From admission, onward) Comment          Start     Ordered   01/07/20 0500  CBC with Differential/Platelet  Daily,   R     Question:  Specimen collection method  Answer:  Lab=Lab collect   01/06/20 1555           Home Health: None Equipment/Devices: None  Discharge Condition: Stable CODE STATUS: Full code Diet recommendation: Cardiac  Subjective: Seen and examined.  He has no complaints.  Remains alert and oriented.  Brief/Interim Summary: 86 yo M, admitted 6/25 from nursing facility with AMS. Very poor PO intake x several days per patient, baseline high stool output. Hypotensive in ED with concern for sepsis, received 57ml/kg IVF. RUS [place] 6/25 >mildly prominent common bile duct without definitive stone. Incidental finding of R renal cyst. CXR 6/25 >chronic asymmetric R hemidiaphragm elevation. No acute abnormality. Renal US- could not see left kidney, right kidney no obvious pyelo.  ED physician had initially called ICU for admission in ICU due to downtrend in blood pressure however ICU team had declined admission and patient ended up being admitted to hospital service secondary to sepsis with multiorgan failure, UTI.  He received fluid bolus in the emergency department.  He also came in with AKI on CKD stage IIIa, anion gap metabolic acidosis and acute metabolic encephalopathy.  Soon after admission within few hours, patient continued to decline due to his blood pressure and PCCM was once again consulted and he was admitted under ICU team and required  vasopressors. Blood culture x2 01/02/2020 urine grew Klebsiella.  Urine culture grew E. coli on 01/02/2020.  He was initially on cefepime and subsequently was switched to cefazolin based on the culture reports.  He was off of the vasopressors and was subsequently transferred back under TRH to progressive unit on 01/06/2020.  Patient since then has been stable.  He was evaluated by PT OT who recommended SNF which has been arranged for him.  Due to hypotension and AKI, patient's Lasix as well as lisinopril were held.  Patient also developed mild anemia and thrombocytopenia.  He did not require any blood transfusion.  His hemoglobin then stabilized.  This was thought to be secondary to sepsis.  Anticoagulants were held.  DIC panel was negative.  Subsequently his platelets improved.  He was placed back on his home dose of Eliquis which he was taking due to previous history of PE/DVT.  He also was diagnosed with acute on chronic systolic congestive heart failure with ejection fraction of 35 to 40% based on the echo done in March 2021.  Due to hypotension, he was also started on midodrine.  His renal function has a started to improve although slowly.  Remains on low normal blood pressure.  Midodrine was increased from 5 mg 3 times daily to 10 mg 3 times daily on 01/08/2020 and his blood pressure improved much and this is also helping with improvement in his renal function.  He will be discharged today in stable condition.  We have discontinued his Lasix and lisinopril due to AKI which is improving  and have started him on midodrine.  Of note, patient received 2 to 3 days of cefepime followed by cefazolin for 5 days.  He is being discharged on Ceftin p.o. 5 mg twice daily for 3 more days.  Discharge Diagnoses:  Principal Problem:   Sepsis (HCC) Active Problems:   Bilateral lower extremity edema   Physical deconditioning   AKI (acute kidney injury) (HCC)   Essential hypertension, benign   Dyslipidemia associated with  type 2 diabetes mellitus (HCC)   Chronic systolic CHF (congestive heart failure) (HCC)   High anion gap metabolic acidosis   Elevated liver enzymes   Total bilirubin, elevated   Thrombocytopenia (HCC)   Acute hypoxemic respiratory failure (HCC)   Acute lower UTI   DVT (deep venous thrombosis) (HCC)   Septic shock (HCC)   Pressure injury of skin   Palliative care by specialist    Discharge Instructions   Allergies as of 01/09/2020   No Known Allergies     Medication List    STOP taking these medications   furosemide 40 MG tablet Commonly known as: LASIX   lisinopril 5 MG tablet Commonly known as: ZESTRIL     TAKE these medications   cefUROXime 500 MG tablet Commonly known as: CEFTIN Take 1 tablet (500 mg total) by mouth 2 (two) times daily with a meal for 3 days.   cetirizine 10 MG tablet Commonly known as: ZYRTEC Take 10 mg by mouth daily as needed for allergies.   Eliquis 5 MG Tabs tablet Generic drug: apixaban Take 5 mg by mouth 2 (two) times daily.   feeding supplement (ENSURE ENLIVE) Liqd Take 237 mLs by mouth 2 (two) times daily between meals.   hydrocerin Crea Apply 1 application topically 2 (two) times daily. Bilateral lower legs   midodrine 10 MG tablet Commonly known as: PROAMATINE Take 1 tablet (10 mg total) by mouth 3 (three) times daily.   polyethylene glycol 17 g packet Commonly known as: MIRALAX / GLYCOLAX Take 17 g by mouth daily.   tamsulosin 0.4 MG Caps capsule Commonly known as: FLOMAX Take 1 capsule (0.4 mg total) by mouth at bedtime.       Follow-up Information    Mark Apley, MD Follow up in 1 week(s).   Specialty: Internal Medicine Contact information: [address] [place] [address] [phone]              No Known Allergies  Consultations: PCCM/TRH   Procedures/Studies: US RENAL  Result Date: 01/03/2020 CLINICAL DATA:  Pyelonephritis EXAM: RENAL / URINARY TRACT ULTRASOUND COMPLETE COMPARISON:   None. FINDINGS: Right Kidney: Renal measurements: 11.9 x 6.3 x 6.2 cm = volume: 240.7 mL. Mild increased parenchymal echogenicity. Prominent exophytic upper pole cyst measuring 5.3 x 6.4 x 5.4 cm. Smaller lower pole cyst measuring 3.9 x 3.6 x 3.7 cm. No solid masses, no stones and no hydronephrosis. Left Kidney: Renal measurements: Left kidney incompletely visualized and could not be measured. Mild increased parenchymal echogenicity. Cyst noted in the mid to upper pole region of the left kidney measuring 4.5 x 5.9 x 5.4 cm. No evidence of hydronephrosis. Bladder: Appears normal for degree of bladder distention. Other: Prostate enlarged and heterogeneous measuring 5.6 x 6.7 x 5.9 cm, volume 116.4 mL IMPRESSION: 1. No acute findings. No evidence of hydronephrosis. No findings suggesting a perinephric abscess. 2. Left kidney incompletely visualized. 3. Bilateral renal cysts. 4. Prostate hypertrophy. Electronically Signed   By: Amie Portland M.D.   On: 01/03/2020 12:51  DG CHEST PORT 1 VIEW  Result Date: 01/04/2020 CLINICAL DATA:  Sepsis.  History of stroke. EXAM: PORTABLE CHEST 1 VIEW COMPARISON:  January 02, 2020 FINDINGS: Again noted is an elevated right hemidiaphragm. Mild increased hazy opacity in the right base. Minimal atelectasis in the left base. The cardiomediastinal silhouette is stable. No pneumothorax. No other interval changes. IMPRESSION: 1. Mild increased haziness in the right base may represent a small layering effusion or atelectasis. Infiltrate/pneumonia considered less likely. Recommend attention on follow-up. No other change. Electronically Signed   By: Gerome Sam III M.D   On: 01/04/2020 08:25   DG Chest Port 1 View  Result Date: 01/02/2020 CLINICAL DATA:  tachycardia and possible sepsis. EXAM: PORTABLE CHEST 1 VIEW COMPARISON:  09/19/2019 . FINDINGS: Chronic asymmetric elevation of the right hemidiaphragm. Heart size normal. No pleural effusion or edema identified. No acute osseous  abnormality. IMPRESSION: 1. No acute cardiopulmonary abnormalities. 2. Chronic asymmetric elevation of the right hemidiaphragm. Electronically Signed   By: Signa Kell M.D.   On: 01/02/2020 11:19   ECHOCARDIOGRAM LIMITED  Result Date: 01/03/2020    ECHOCARDIOGRAM LIMITED REPORT   Patient Name:   Mark Bullock Date of Exam: 01/03/2020 Medical Rec #:  [id number]           Height:       67.0 in Accession #:    [id number]          Weight:       175.0 lb Date of Birth:  13-Aug-1928           BSA:          1.911 m Patient Age:    86 years            BP:           95/65 mmHg Patient Gender: M                   HR:           86 bpm. Exam Location:  Inpatient Procedure: 2D Echo, Cardiac Doppler and Color Doppler Indications:    Shock  History:        Patient has prior history of Echocardiogram examinations, most                 recent 09/10/2019. CHF; Risk Factors:Hypertension and                 Dyslipidemia. H/o DVT.  Sonographer:    Ross Ludwig RDCS (AE) Referring Phys: [id number] GRACE E BOWSER IMPRESSIONS  1. Left ventricular ejection fraction, by estimation, is approximately 25%. The left ventricle demonstrates regional wall motion abnormalities (see scoring diagram/findings for description). There is mild left ventricular hypertrophy.  2. Right ventricular systolic function is moderately reduced. There is moderately elevated pulmonary artery systolic pressure.  3. The mitral valve is grossly normal. Mild mitral valve regurgitation.  4. Tricuspid valve regurgitation is mild to moderate. Tricuspid valve is mildly thickened.  5. The aortic valve is tricuspid. Aortic valve regurgitation is not visualized.  6. The inferior vena cava is normal in size with <50% respiratory variability, suggesting right atrial pressure of 8 mmHg. FINDINGS  Left Ventricle: Left ventricular ejection fraction, by estimation, is 25%. The left ventricle demonstrates regional wall motion abnormalities. The left ventricular internal cavity size  was normal in size. There is mild left ventricular hypertrophy. Abnormal (paradoxical) septal motion, consistent with left bundle branch block.  LV Wall Scoring: The mid and distal  inferior wall, mid inferoseptal segment, apical septal segment, and apex are akinetic. The entire anterior wall, mid and distal lateral wall, anterior septum, mid anterolateral segment, basal inferior segment, and basal inferoseptal segment are hypokinetic. The basal inferolateral segment and basal anterolateral segment are normal. Right Ventricle: Right ventricular systolic function is moderately reduced. There is moderately elevated pulmonary artery systolic pressure. The tricuspid regurgitant velocity is 3.26 m/s, and with an assumed right atrial pressure of 8 mmHg, the estimated right ventricular systolic pressure is 50.5 mmHg. Pericardium: There is no evidence of pericardial effusion. Mitral Valve: The mitral valve is grossly normal. Mild mitral valve regurgitation. Tricuspid Valve: The tricuspid valve is grossly normal. Tricuspid valve regurgitation is mild to moderate. Aortic Valve: The aortic valve is tricuspid. . There is mild thickening of the aortic valve. Aortic valve regurgitation is not visualized. Mild to moderate aortic valve annular calcification. There is mild thickening of the aortic valve. Pulmonic Valve: The pulmonic valve was grossly normal. Pulmonic valve regurgitation is trivial. Aorta: The aortic root is normal in size and structure. Venous: The inferior vena cava is normal in size with less than 50% respiratory variability, suggesting right atrial pressure of 8 mmHg. IAS/Shunts: No atrial level shunt detected by color flow Doppler.  LEFT VENTRICLE PLAX 2D LVIDd:         5.00 cm LVIDs:         4.60 cm LV PW:         1.10 cm LV IVS:        1.30 cm LVOT diam:     2.10 cm LVOT Area:     3.46 cm  LV Volumes (MOD) LV vol d, MOD A2C: 103.0 ml LV vol d, MOD A4C: 122.0 ml LV vol s, MOD A2C: 73.2 ml LV vol s, MOD A4C:  77.2 ml LV SV MOD A2C:     29.8 ml LV SV MOD A4C:     122.0 ml LV SV MOD BP:      38.7 ml IVC IVC diam: 2.00 cm LEFT ATRIUM         Index LA diam:    3.80 cm 1.99 cm/m   AORTA Ao Root diam: 3.80 cm Ao Asc diam:  3.80 cm TRICUSPID VALVE TR Peak grad:   42.5 mmHg TR Vmax:        326.00 cm/s  SHUNTS Systemic Diam: 2.10 cm Nona Dell MD Electronically signed by Nona Dell MD Signature Date/Time: 01/03/2020/3:42:25 PM    Final    US Abdomen Limited RUQ  Result Date: 01/02/2020 CLINICAL DATA:  Elevated bilirubin EXAM: ULTRASOUND ABDOMEN LIMITED RIGHT UPPER QUADRANT COMPARISON:  None. FINDINGS: Gallbladder: Gallbladder is not well visualized. Common bile duct: Diameter: 10 mm which is slightly enlarged for the patient's given age. Liver: No focal lesion identified. Within normal limits in parenchymal echogenicity. Portal vein is patent on color Doppler imaging with normal direction of blood flow towards the liver. Other: Incidental note is made of right renal cyst measuring 5 cm IMPRESSION: Nonvisualization of the gallbladder. This may be due to decompression. Mildly prominent common bile duct. No definitive ductal stone is seen. Right renal cyst. Electronically Signed   By: Alcide Clever M.D.   On: 01/02/2020 12:58      Discharge Exam: Vitals:   01/09/20 0346 01/09/20 0722  BP: 105/66 114/64  Pulse: 72 69  Resp: 20 15  Temp: 98.5 F (36.9 C) 98.2 F (36.8 C)  SpO2: 97% 97%   Vitals:   01/08/20  1915 01/08/20 2333 01/09/20 0346 01/09/20 0722  BP: 110/61 104/66 105/66 114/64  Pulse: 78 75 72 69  Resp: 20 20 20 15   Temp: 98.9 F (37.2 C) 98.6 F (37 C) 98.5 F (36.9 C) 98.2 F (36.8 C)  TempSrc: Oral Oral Oral Oral  SpO2: 97% 96% 97% 97%  Weight:   85.4 kg   Height:        General: Pt is alert, awake, not in acute distress Cardiovascular: RRR, S1/S2 +, no rubs, no gallops Respiratory: CTA bilaterally, no wheezing, no rhonchi Abdominal: Soft, NT, ND, bowel sounds + Extremities:  no edema, no cyanosis    The results of significant diagnostics from this hospitalization (including imaging, microbiology, ancillary and laboratory) are listed below for reference.     Microbiology: Recent Results (from the past 240 hour(s))  Blood Culture (routine x 2)     Status: Abnormal   Collection Time: 01/02/20  9:37 AM   Specimen: BLOOD  Result Value Ref Range Status   Specimen Description BLOOD RIGHT ANTECUBITAL  Final   Special Requests   Final    BOTTLES DRAWN AEROBIC AND ANAEROBIC Blood Culture adequate volume   Culture  Setup Time   Final    IN BOTH AEROBIC AND ANAEROBIC BOTTLES GRAM NEGATIVE RODS CRITICAL RESULT CALLED TO, READ BACK BY AND VERIFIED WITH: PHRMD V BRYK @0414  01/03/20 BY S GEZAHEGN    Culture KLEBSIELLA PNEUMONIAE (A)  Final   Report Status 01/05/2020 FINAL  Final   Organism ID, Bacteria KLEBSIELLA PNEUMONIAE  Final      Susceptibility   Klebsiella pneumoniae - MIC*    AMPICILLIN RESISTANT Resistant     CEFAZOLIN <=4 SENSITIVE Sensitive     CEFEPIME <=0.12 SENSITIVE Sensitive     CEFTAZIDIME <=1 SENSITIVE Sensitive     CEFTRIAXONE <=0.25 SENSITIVE Sensitive     CIPROFLOXACIN <=0.25 SENSITIVE Sensitive     GENTAMICIN <=1 SENSITIVE Sensitive     IMIPENEM <=0.25 SENSITIVE Sensitive     TRIMETH/SULFA <=20 SENSITIVE Sensitive     AMPICILLIN/SULBACTAM <=2 SENSITIVE Sensitive     PIP/TAZO Value in next row Sensitive      <=4 SENSITIVEPerformed at [facility], [address], [place], [place] [address]    * KLEBSIELLA PNEUMONIAE  Blood Culture ID Panel (Reflexed)     Status: Abnormal   Collection Time: 01/02/20  9:37 AM  Result Value Ref Range Status   Enterococcus species NOT DETECTED NOT DETECTED Final   Listeria monocytogenes NOT DETECTED NOT DETECTED Final   Staphylococcus species NOT DETECTED NOT DETECTED Final   Staphylococcus aureus (BCID) NOT DETECTED NOT DETECTED Final   Streptococcus species NOT DETECTED NOT DETECTED Final    Streptococcus agalactiae NOT DETECTED NOT DETECTED Final   Streptococcus pneumoniae NOT DETECTED NOT DETECTED Final   Streptococcus pyogenes NOT DETECTED NOT DETECTED Final   Acinetobacter baumannii NOT DETECTED NOT DETECTED Final   Enterobacteriaceae species DETECTED (A) NOT DETECTED Final    Comment: Enterobacteriaceae represent a large family of gram-negative bacteria, not a single organism. CRITICAL RESULT CALLED TO, READ BACK BY AND VERIFIED WITH: PHRMD V BRYK @0414  01/03/20 BY S GEZAHEGN    Enterobacter cloacae complex NOT DETECTED NOT DETECTED Final   Escherichia coli NOT DETECTED NOT DETECTED Final   Klebsiella oxytoca NOT DETECTED NOT DETECTED Final   Klebsiella pneumoniae DETECTED (A) NOT DETECTED Final    Comment: CRITICAL RESULT CALLED TO, READ BACK BY AND VERIFIED WITH: PHRMD V BRYK @0414  01/03/20 BY  S GEZAHEGN    Proteus species NOT DETECTED NOT DETECTED Final   Serratia marcescens NOT DETECTED NOT DETECTED Final   Carbapenem resistance NOT DETECTED NOT DETECTED Final   Haemophilus influenzae NOT DETECTED NOT DETECTED Final   Neisseria meningitidis NOT DETECTED NOT DETECTED Final   Pseudomonas aeruginosa NOT DETECTED NOT DETECTED Final   Candida albicans NOT DETECTED NOT DETECTED Final   Candida glabrata NOT DETECTED NOT DETECTED Final   Candida krusei NOT DETECTED NOT DETECTED Final   Candida parapsilosis NOT DETECTED NOT DETECTED Final   Candida tropicalis NOT DETECTED NOT DETECTED Final    Comment: Performed at [facility], [address], [place], [place] [address]  SARS Coronavirus 2 by RT PCR (hospital order, performed in [facility] hospital lab) Nasopharyngeal Nasopharyngeal Swab     Status: None   Collection Time: 01/02/20  9:45 AM   Specimen: Nasopharyngeal Swab  Result Value Ref Range Status   SARS Coronavirus 2 NEGATIVE NEGATIVE Final    Comment: (NOTE) SARS-CoV-2 target nucleic acids are NOT DETECTED.  The SARS-CoV-2 RNA is generally detectable in  upper and lower respiratory specimens during the acute phase of infection. The lowest concentration of SARS-CoV-2 viral copies this assay can detect is 250 copies / mL. A negative result does not preclude SARS-CoV-2 infection and should not be used as the sole basis for treatment or other patient management decisions.  A negative result may occur with improper specimen collection / handling, submission of specimen other than nasopharyngeal swab, presence of viral mutation(s) within the areas targeted by this assay, and inadequate number of viral copies (<250 copies / mL). A negative result must be combined with clinical observations, patient history, and epidemiological information.  Fact Sheet for Patients:   BoilerBrush.com.cy  Fact Sheet for Healthcare Providers: https://pope.com/  This test is not yet approved or  cleared by the [place] FDA and has been authorized for detection and/or diagnosis of SARS-CoV-2 by FDA under an Emergency Use Authorization (EUA).  This EUA will remain in effect (meaning this test can be used) for the duration of the COVID-19 declaration under Section 564(b)(1) of the Act, 21 U.S.C. section 360bbb-3(b)(1), unless the authorization is terminated or revoked sooner.  Performed at [facility], [address], [place], [place] [address]   Blood Culture (routine x 2)     Status: Abnormal   Collection Time: 01/02/20  9:46 AM   Specimen: BLOOD LEFT FOREARM  Result Value Ref Range Status   Specimen Description BLOOD LEFT FOREARM  Final   Special Requests   Final    BOTTLES DRAWN AEROBIC AND ANAEROBIC Blood Culture results may not be optimal due to an inadequate volume of blood received in culture bottles   Culture  Setup Time   Final    IN BOTH AEROBIC AND ANAEROBIC BOTTLES GRAM NEGATIVE RODS CRITICAL VALUE NOTED.  VALUE IS CONSISTENT WITH PREVIOUSLY REPORTED AND CALLED VALUE.    Culture (A)   Final    KLEBSIELLA PNEUMONIAE SUSCEPTIBILITIES PERFORMED ON PREVIOUS CULTURE WITHIN THE LAST 5 DAYS. Performed at [facility], [address], [place], [place] [address]    Report Status 01/05/2020 FINAL  Final  Urine culture     Status: Abnormal   Collection Time: 01/02/20 10:05 AM   Specimen: In/Out Cath Urine  Result Value Ref Range Status   Specimen Description IN/OUT CATH URINE  Final   Special Requests   Final    NONE Performed at  [facility], [address] [place], [place] [address]    Culture >=100,000 COLONIES/mL ESCHERICHIA COLI (A)  Final   Report Status 01/04/2020 FINAL  Final   Organism ID, Bacteria ESCHERICHIA COLI (A)  Final      Susceptibility   Escherichia coli - MIC*    AMPICILLIN 4 SENSITIVE Sensitive     CEFAZOLIN <=4 SENSITIVE Sensitive     CEFTRIAXONE <=0.25 SENSITIVE Sensitive     CIPROFLOXACIN <=0.25 SENSITIVE Sensitive     GENTAMICIN <=1 SENSITIVE Sensitive     IMIPENEM <=0.25 SENSITIVE Sensitive     NITROFURANTOIN <=16 SENSITIVE Sensitive     TRIMETH/SULFA <=20 SENSITIVE Sensitive     AMPICILLIN/SULBACTAM <=2 SENSITIVE Sensitive     PIP/TAZO <=4 SENSITIVE Sensitive     * >=100,000 COLONIES/mL ESCHERICHIA COLI  MRSA PCR Screening     Status: None   Collection Time: 01/02/20  4:44 PM   Specimen: Nasal Mucosa; Nasopharyngeal  Result Value Ref Range Status   MRSA by PCR NEGATIVE NEGATIVE Final    Comment:        The GeneXpert MRSA Assay (FDA approved for NASAL specimens only), is one component of a comprehensive MRSA colonization surveillance program. It is not intended to diagnose MRSA infection nor to guide or monitor treatment for MRSA infections. Performed at [facility], [address], [place], [place] [address]   SARS Coronavirus 2 by RT PCR (hospital order, performed in [facility] hospital lab) Nasopharyngeal Nasopharyngeal Swab     Status: None   Collection Time: 01/07/20  2:41 PM   Specimen: Nasopharyngeal Swab   Result Value Ref Range Status   SARS Coronavirus 2 NEGATIVE NEGATIVE Final    Comment: (NOTE) SARS-CoV-2 target nucleic acids are NOT DETECTED.  The SARS-CoV-2 RNA is generally detectable in upper and lower respiratory specimens during the acute phase of infection. The lowest concentration of SARS-CoV-2 viral copies this assay can detect is 250 copies / mL. A negative result does not preclude SARS-CoV-2 infection and should not be used as the sole basis for treatment or other patient management decisions.  A negative result may occur with improper specimen collection / handling, submission of specimen other than nasopharyngeal swab, presence of viral mutation(s) within the areas targeted by this assay, and inadequate number of viral copies (<250 copies / mL). A negative result must be combined with clinical observations, patient history, and epidemiological information.  Fact Sheet for Patients:   BoilerBrush.com.cy  Fact Sheet for Healthcare Providers: https://pope.com/  This test is not yet approved or  cleared by the [place] FDA and has been authorized for detection and/or diagnosis of SARS-CoV-2 by FDA under an Emergency Use Authorization (EUA).  This EUA will remain in effect (meaning this test can be used) for the duration of the COVID-19 declaration under Section 564(b)(1) of the Act, 21 U.S.C. section 360bbb-3(b)(1), unless the authorization is terminated or revoked sooner.  Performed at [facility], [address], [place], [place] [address]      Labs: BNP (last 3 results) Recent Labs    03/28/19 2249 09/19/19 1749 01/02/20 1557  BNP 88.0 69.0 441.2*   Basic Metabolic Panel: Recent Labs  Lab 01/02/20 1425 01/02/20 1557 01/05/20 1036 01/06/20 0331 01/07/20 0231 01/08/20 0326 01/09/20 0904  NA  --    < > 135 137 135 137 139  K  --    < > 3.8 3.8 3.7 4.7 3.8  CL  --    < >  104 106 107 107 110   CO2  --    < > 20* 21* 19* 21* 21*  GLUCOSE  --    < > 108* 111* 106* 118* 96  BUN  --    < > 58* 58* 53* 50* 42*  CREATININE  --    < > 3.42* 3.17* 2.98* 2.73* 2.49*  CALCIUM  --    < > 8.0* 8.3* 8.1* 8.0* 8.1*  MG 1.2*  --   --  2.1  --   --   --   PHOS 2.8  --   --   --   --   --   --    < > = values in this interval not displayed.   Liver Function Tests: Recent Labs  Lab 01/02/20 1047 01/03/20 0404 01/04/20 0350 01/05/20 1036  AST 100* 59* 32 26  ALT 130* 89* 52* 40  ALKPHOS 210* 158* 121 180*  BILITOT 6.1* 7.0* 4.7* 4.8*  PROT 6.2* 5.7* 5.1* 5.2*  ALBUMIN 2.6* 2.3* 1.9* 1.7*   Recent Labs  Lab 01/02/20 1557  LIPASE 18  AMYLASE 175*   Recent Labs  Lab 01/02/20 1339  AMMONIA 34   CBC: Recent Labs  Lab 01/02/20 1047 01/02/20 1557 01/04/20 0350 01/04/20 0350 01/04/20 1028 01/06/20 0331 01/07/20 0231 01/08/20 0326 01/09/20 0212  WBC 25.2*   < > 20.4*  --   --  9.9 10.0 10.2 10.6*  NEUTROABS 23.4*  --   --   --   --   --  6.9 7.0 7.0  HGB 12.4*   < > 11.0*  --   --  11.3* 11.0* 10.4* 9.8*  HCT 38.1*   < > 31.7*  --   --  33.0* 32.1* 30.3* 29.5*  MCV 86.4   < > 81.7  --   --  80.9 80.5 82.1 82.9  PLT 110*   < > 39*   < > 34* 41* 99* 173 223   < > = values in this interval not displayed.   Cardiac Enzymes: No results for input(s): CKTOTAL, CKMB, CKMBINDEX, TROPONINI in the last 168 hours. BNP: Invalid input(s): POCBNP CBG: Recent Labs  Lab 01/08/20 0558 01/08/20 1127 01/08/20 1642 01/08/20 2118 01/09/20 0611  GLUCAP 106* 126* 91 82 81   D-Dimer No results for input(s): DDIMER in the last 72 hours. Hgb A1c No results for input(s): HGBA1C in the last 72 hours. Lipid Profile No results for input(s): CHOL, HDL, LDLCALC, TRIG, CHOLHDL, LDLDIRECT in the last 72 hours. Thyroid function studies No results for input(s): TSH, T4TOTAL, T3FREE, THYROIDAB in the last 72 hours.  Invalid input(s): FREET3 Anemia work up No results for input(s):  VITAMINB12, FOLATE, FERRITIN, TIBC, IRON, RETICCTPCT in the last 72 hours. Urinalysis    Component Value Date/Time   COLORURINE AMBER (A) 01/02/2020 1005   APPEARANCEUR TURBID (A) 01/02/2020 1005   LABSPEC 1.019 01/02/2020 1005   PHURINE 5.0 01/02/2020 1005   GLUCOSEU NEGATIVE 01/02/2020 1005   HGBUR MODERATE (A) 01/02/2020 1005   BILIRUBINUR NEGATIVE 01/02/2020 1005   KETONESUR NEGATIVE 01/02/2020 1005   PROTEINUR 100 (A) 01/02/2020 1005   UROBILINOGEN 1.0 01/16/2010 0200   NITRITE NEGATIVE 01/02/2020 1005   LEUKOCYTESUR SMALL (A) 01/02/2020 1005   Sepsis Labs Invalid input(s): PROCALCITONIN,  WBC,  LACTICIDVEN Microbiology Recent Results (from the past 240 hour(s))  Blood Culture (routine x 2)     Status: Abnormal   Collection Time: 01/02/20  9:37 AM  Specimen: BLOOD  Result Value Ref Range Status   Specimen Description BLOOD RIGHT ANTECUBITAL  Final   Special Requests   Final    BOTTLES DRAWN AEROBIC AND ANAEROBIC Blood Culture adequate volume   Culture  Setup Time   Final    IN BOTH AEROBIC AND ANAEROBIC BOTTLES GRAM NEGATIVE RODS CRITICAL RESULT CALLED TO, READ BACK BY AND VERIFIED WITH: PHRMD V BRYK @0414  01/03/20 BY S GEZAHEGN    Culture KLEBSIELLA PNEUMONIAE (A)  Final   Report Status 01/05/2020 FINAL  Final   Organism ID, Bacteria KLEBSIELLA PNEUMONIAE  Final      Susceptibility   Klebsiella pneumoniae - MIC*    AMPICILLIN RESISTANT Resistant     CEFAZOLIN <=4 SENSITIVE Sensitive     CEFEPIME <=0.12 SENSITIVE Sensitive     CEFTAZIDIME <=1 SENSITIVE Sensitive     CEFTRIAXONE <=0.25 SENSITIVE Sensitive     CIPROFLOXACIN <=0.25 SENSITIVE Sensitive     GENTAMICIN <=1 SENSITIVE Sensitive     IMIPENEM <=0.25 SENSITIVE Sensitive     TRIMETH/SULFA <=20 SENSITIVE Sensitive     AMPICILLIN/SULBACTAM <=2 SENSITIVE Sensitive     PIP/TAZO Value in next row Sensitive      <=4 SENSITIVEPerformed at [facility], [address], [place], [place] [address]    *  KLEBSIELLA PNEUMONIAE  Blood Culture ID Panel (Reflexed)     Status: Abnormal   Collection Time: 01/02/20  9:37 AM  Result Value Ref Range Status   Enterococcus species NOT DETECTED NOT DETECTED Final   Listeria monocytogenes NOT DETECTED NOT DETECTED Final   Staphylococcus species NOT DETECTED NOT DETECTED Final   Staphylococcus aureus (BCID) NOT DETECTED NOT DETECTED Final   Streptococcus species NOT DETECTED NOT DETECTED Final   Streptococcus agalactiae NOT DETECTED NOT DETECTED Final   Streptococcus pneumoniae NOT DETECTED NOT DETECTED Final   Streptococcus pyogenes NOT DETECTED NOT DETECTED Final   Acinetobacter baumannii NOT DETECTED NOT DETECTED Final   Enterobacteriaceae species DETECTED (A) NOT DETECTED Final    Comment: Enterobacteriaceae represent a large family of gram-negative bacteria, not a single organism. CRITICAL RESULT CALLED TO, READ BACK BY AND VERIFIED WITH: PHRMD V BRYK @0414  01/03/20 BY S GEZAHEGN    Enterobacter cloacae complex NOT DETECTED NOT DETECTED Final   Escherichia coli NOT DETECTED NOT DETECTED Final   Klebsiella oxytoca NOT DETECTED NOT DETECTED Final   Klebsiella pneumoniae DETECTED (A) NOT DETECTED Final    Comment: CRITICAL RESULT CALLED TO, READ BACK BY AND VERIFIED WITH: PHRMD V BRYK @0414  01/03/20 BY S GEZAHEGN    Proteus species NOT DETECTED NOT DETECTED Final   Serratia marcescens NOT DETECTED NOT DETECTED Final   Carbapenem resistance NOT DETECTED NOT DETECTED Final   Haemophilus influenzae NOT DETECTED NOT DETECTED Final   Neisseria meningitidis NOT DETECTED NOT DETECTED Final   Pseudomonas aeruginosa NOT DETECTED NOT DETECTED Final   Candida albicans NOT DETECTED NOT DETECTED Final   Candida glabrata NOT DETECTED NOT DETECTED Final   Candida krusei NOT DETECTED NOT DETECTED Final   Candida parapsilosis NOT DETECTED NOT DETECTED Final   Candida tropicalis NOT DETECTED NOT DETECTED Final    Comment: Performed at [facility],  [address], [place], [place] [address]  SARS Coronavirus 2 by RT PCR (hospital order, performed in [facility] hospital lab) Nasopharyngeal Nasopharyngeal Swab     Status: None   Collection Time: 01/02/20  9:45 AM   Specimen: Nasopharyngeal Swab  Result Value Ref Range Status  SARS Coronavirus 2 NEGATIVE NEGATIVE Final    Comment: (NOTE) SARS-CoV-2 target nucleic acids are NOT DETECTED.  The SARS-CoV-2 RNA is generally detectable in upper and lower respiratory specimens during the acute phase of infection. The lowest concentration of SARS-CoV-2 viral copies this assay can detect is 250 copies / mL. A negative result does not preclude SARS-CoV-2 infection and should not be used as the sole basis for treatment or other patient management decisions.  A negative result may occur with improper specimen collection / handling, submission of specimen other than nasopharyngeal swab, presence of viral mutation(s) within the areas targeted by this assay, and inadequate number of viral copies (<250 copies / mL). A negative result must be combined with clinical observations, patient history, and epidemiological information.  Fact Sheet for Patients:   BoilerBrush.com.cyhttps://www.fda.gov/media/136312/download  Fact Sheet for Healthcare Providers: https://pope.com/https://www.fda.gov/media/136313/download  This test is not yet approved or  cleared by the [place] FDA and has been authorized for detection and/or diagnosis of SARS-CoV-2 by FDA under an Emergency Use Authorization (EUA).  This EUA will remain in effect (meaning this test can be used) for the duration of the COVID-19 declaration under Section 564(b)(1) of the Act, 21 U.S.C. section 360bbb-3(b)(1), unless the authorization is terminated or revoked sooner.  Performed at [facility], [address] [place], [place] [address]   Blood Culture (routine x 2)     Status: Abnormal   Collection Time: 01/02/20  9:46 AM   Specimen: BLOOD LEFT FOREARM  Result  Value Ref Range Status   Specimen Description BLOOD LEFT FOREARM  Final   Special Requests   Final    BOTTLES DRAWN AEROBIC AND ANAEROBIC Blood Culture results may not be optimal due to an inadequate volume of blood received in culture bottles   Culture  Setup Time   Final    IN BOTH AEROBIC AND ANAEROBIC BOTTLES GRAM NEGATIVE RODS CRITICAL VALUE NOTED.  VALUE IS CONSISTENT WITH PREVIOUSLY REPORTED AND CALLED VALUE.    Culture (A)  Final    KLEBSIELLA PNEUMONIAE SUSCEPTIBILITIES PERFORMED ON PREVIOUS CULTURE WITHIN THE LAST 5 DAYS. Performed at [facility], [address] [place], [place] [address]    Report Status 01/05/2020 FINAL  Final  Urine culture     Status: Abnormal   Collection Time: 01/02/20 10:05 AM   Specimen: In/Out Cath Urine  Result Value Ref Range Status   Specimen Description IN/OUT CATH URINE  Final   Special Requests   Final    NONE Performed at [facility], [address] [place], [place] [address]    Culture >=100,000 COLONIES/mL ESCHERICHIA COLI (A)  Final   Report Status 01/04/2020 FINAL  Final   Organism ID, Bacteria ESCHERICHIA COLI (A)  Final      Susceptibility   Escherichia coli - MIC*    AMPICILLIN 4 SENSITIVE Sensitive     CEFAZOLIN <=4 SENSITIVE Sensitive     CEFTRIAXONE <=0.25 SENSITIVE Sensitive     CIPROFLOXACIN <=0.25 SENSITIVE Sensitive     GENTAMICIN <=1 SENSITIVE Sensitive     IMIPENEM <=0.25 SENSITIVE Sensitive     NITROFURANTOIN <=16 SENSITIVE Sensitive     TRIMETH/SULFA <=20 SENSITIVE Sensitive     AMPICILLIN/SULBACTAM <=2 SENSITIVE Sensitive     PIP/TAZO <=4 SENSITIVE Sensitive     * >=100,000 COLONIES/mL ESCHERICHIA COLI  MRSA PCR Screening     Status: None   Collection Time: 01/02/20  4:44 PM   Specimen: Nasal Mucosa; Nasopharyngeal  Result Value Ref Range Status   MRSA by PCR NEGATIVE NEGATIVE Final    Comment:        The GeneXpert MRSA Assay (FDA approved for NASAL specimens only), is one component of  a comprehensive MRSA colonization surveillance program. It is not intended to diagnose MRSA infection nor to guide or monitor treatment for MRSA infections. Performed at [facility], [address] [place], [place] [address]   SARS Coronavirus 2 by RT PCR (hospital order, performed in [facility] hospital lab) Nasopharyngeal Nasopharyngeal Swab     Status: None   Collection Time: 01/07/20  2:41 PM   Specimen: Nasopharyngeal Swab  Result Value Ref Range Status   SARS Coronavirus 2 NEGATIVE NEGATIVE Final    Comment: (NOTE) SARS-CoV-2 target nucleic acids are NOT DETECTED.  The SARS-CoV-2 RNA is generally detectable in upper and lower respiratory specimens during the acute phase of infection. The lowest concentration of SARS-CoV-2 viral copies this assay can detect is 250 copies / mL. A negative result does not preclude SARS-CoV-2 infection and should not be used as the sole basis for treatment or other patient management decisions.  A negative result may occur with improper specimen collection / handling, submission of specimen other than nasopharyngeal swab, presence of viral mutation(s) within the areas targeted by this assay, and inadequate number of viral copies (<250 copies / mL). A negative result must be combined with clinical observations, patient history, and epidemiological information.  Fact Sheet for Patients:   BoilerBrush.com.cy  Fact Sheet for Healthcare Providers: https://pope.com/  This test is not yet approved or  cleared by the [place] FDA and has been authorized for detection and/or diagnosis of SARS-CoV-2 by FDA under an Emergency Use Authorization (EUA).  This EUA will remain in effect (meaning this test can be used) for the duration of the COVID-19 declaration under Section 564(b)(1) of the Act, 21 U.S.C. section 360bbb-3(b)(1), unless the authorization is terminated or revoked  sooner.  Performed at [facility], [address] [place], [place] [address]      Time coordinating discharge: Over 30 minutes  SIGNED:   Hughie Closs, MD  [facility] 01/09/2020, 10:09 AM  If 7PM-7AM, please contact night-coverage [email or web address]

## 2020-01-09 NOTE — Progress Notes (Signed)
Patient discharging to [facility], IV removed without complication site is clean dry and intact. Report give to [place], LPN at [facility] Awaiting [facility].

## 2020-01-09 NOTE — Discharge Instructions (Signed)
Sepsis, Diagnosis, Adult Sepsis is a serious bodily reaction to an infection. The infection that triggers sepsis may be from a bacteria, virus, or fungus. Sepsis can result from an infection in any part of your body. Infections that commonly lead to sepsis include skin, lung, and urinary tract infections. Sepsis is a medical emergency that must be treated right away in a hospital. In severe cases, it can lead to septic shock. Septic shock can weaken your heart and cause your blood pressure to drop. This can cause your central nervous system and your body's organs to stop working. What are the causes? This condition is caused by a severe reaction to infections from bacteria, viruses, or fungus. The germs that most often lead to sepsis include:  Escherichia coli (E. coli) bacteria.  Staphylococcus aureus (staph) bacteria.  Some types of Streptococcus bacteria. The most common infections affect these organs:  The lung (pneumonia).  The kidneys or bladder (urinary tract infection).  The skin (cellulitis).  The bowel, gallbladder, or pancreas. What increases the risk? You are more likely to develop this condition if:  Your body's disease-fighting system (immune system) is weakened.  You are age 65 or older.  You are male.  You had surgery or you have been hospitalized.  You have these devices inserted into your body: ? A small, thin tube (catheter). ? IV line. ? Breathing tube. ? Drainage tube.  You are not getting enough nutrients from food (malnourished).  You have a long-term (chronic) disease, such as cancer, lung disease, kidney disease, or diabetes.  You are African American. What are the signs or symptoms? Symptoms of this condition may include:  Fever.  Chills or feeling very cold.  Confusion or anxiety.  Fatigue.  Muscle aches.  Shortness of breath.  Nausea and vomiting.  Urinating much less than usual.  Fast heart rate (tachycardia).  Rapid  breathing (hyperventilation).  Changes in skin color. Your skin may look blotchy, pale, or blue.  Cool, clammy, or sweaty skin.  Skin rash. Other symptoms depend on the source of your infection. How is this diagnosed? This condition is diagnosed based on:  Your symptoms.  Your medical history.  A physical exam. Other tests may also be done to find out the cause of the infection and how severe the sepsis is. These tests may include:  Blood tests.  Urine tests.  Swabs from other areas of your body that may have an infection. These samples may be tested (cultured) to find out what type of bacteria is causing the infection.  Chest X-ray to check for pneumonia. Other imaging tests, such as a CT scan, may also be done.  Lumbar puncture. This removes a small amount of the fluid that surrounds your brain and spinal cord. The fluid is then examined for infection. How is this treated? This condition must be treated in a hospital. Based on the cause of your infection, you may be given an antibiotic, antiviral, or antifungal medicine. You may also receive:  Fluids through an IV.  Oxygen and breathing assistance.  Medicines to increase your blood pressure.  Kidney dialysis. This process cleans your blood if your kidneys have failed.  Surgery to remove infected tissue.  Blood transfusion if needed.  Medicine to prevent blood clots.  Nutrients to correct imbalances in basic body function (metabolism). You may: ? Receive important salts and minerals (electrolytes) through an IV. ? Have your blood sugar level adjusted. Follow these instructions at home: Medicines   Take over-the-counter and   prescription medicines only as told by your health care provider.  If you were prescribed an antibiotic, antiviral, or antifungal medicine, take it as told by your health care provider. Do not stop taking the medicine even if you start to feel better. General instructions  If you have a  catheter or other indwelling device, ask to have it removed as soon as possible.  Keep all follow-up visits as told by your health care provider. This is important. Contact a health care provider if:  You do not feel like you are getting better or regaining strength.  You are having trouble coping with your recovery.  You frequently feel tired.  You feel worse or do not seem to get better after surgery.  You think you may have an infection after surgery. Get help right away if:  You have any symptoms of sepsis.  You have difficulty breathing.  You have a rapid or skipping heartbeat.  You become confused or disoriented.  You have a high fever.  Your skin becomes blotchy, pale, or blue.  You have an infection that is getting worse or not getting better. These symptoms may represent a serious problem that is an emergency. Do not wait to see if the symptoms will go away. Get medical help right away. Call your local emergency services (911 in the [place] Do not drive yourself to the hospital. Summary  Sepsis is a medical emergency that requires immediate treatment in a hospital.  This condition is caused by a severe reaction to infections from bacteria, viruses, or fungus.  Based on the cause of your infection, you may be given an antibiotic, antiviral, or antifungal medicine.  Treatment may also include IV fluids, breathing assistance, and kidney dialysis. This information is not intended to replace advice given to you by your health care provider. Make sure you discuss any questions you have with your health care provider. Document Revised: 02/01/2018 Document Reviewed: 02/01/2018 Elsevier Patient Education  2020 [employer]  

## 2020-01-09 NOTE — Progress Notes (Signed)
[facility] transporting patient to [facility].

## 2020-02-12 ENCOUNTER — Encounter (HOSPITAL_COMMUNITY): Payer: Self-pay | Admitting: Emergency Medicine

## 2020-02-12 ENCOUNTER — Emergency Department (HOSPITAL_COMMUNITY)
Admission: EM | Admit: 2020-02-12 | Discharge: 2020-02-12 | Disposition: A | Discharge status: Skilled Nursing Facility | Payer: Medicare Other | Attending: Emergency Medicine | Admitting: Emergency Medicine

## 2020-02-12 ENCOUNTER — Emergency Department (HOSPITAL_COMMUNITY): Payer: Medicare Other

## 2020-02-12 DIAGNOSIS — I10 Essential (primary) hypertension: Secondary | ICD-10-CM | POA: Insufficient documentation

## 2020-02-12 DIAGNOSIS — E119 Type 2 diabetes mellitus without complications: Secondary | ICD-10-CM | POA: Diagnosis not present

## 2020-02-12 DIAGNOSIS — N50819 Testicular pain, unspecified: Secondary | ICD-10-CM | POA: Diagnosis present

## 2020-02-12 DIAGNOSIS — N433 Hydrocele, unspecified: Secondary | ICD-10-CM

## 2020-02-12 DIAGNOSIS — Z79899 Other long term (current) drug therapy: Secondary | ICD-10-CM | POA: Diagnosis not present

## 2020-02-12 DIAGNOSIS — R1030 Lower abdominal pain, unspecified: Secondary | ICD-10-CM

## 2020-02-12 LAB — BASIC METABOLIC PANEL
Anion gap: 6 (ref 5–15)
BUN: 19 mg/dL (ref 8–23)
CO2: 28 mmol/L (ref 22–32)
Calcium: 8.3 mg/dL — ABNORMAL LOW (ref 8.9–10.3)
Chloride: 100 mmol/L (ref 98–111)
Creatinine, Ser: 1.13 mg/dL (ref 0.61–1.24)
GFR calc Af Amer: >60 mL/min (ref >60)
GFR calc non Af Amer: 57 mL/min — ABNORMAL LOW (ref >60)
Glucose, Bld: 135 mg/dL — ABNORMAL HIGH (ref 70–99)
Potassium: 3.7 mmol/L (ref 3.5–5.1)
Sodium: 134 mmol/L — ABNORMAL LOW (ref 135–145)

## 2020-02-12 LAB — URINALYSIS, ROUTINE W REFLEX MICROSCOPIC
Bacteria, UA: NONE SEEN
Bilirubin Urine: NEGATIVE
Glucose, UA: NEGATIVE mg/dL
Ketones, ur: NEGATIVE mg/dL
Leukocytes,Ua: NEGATIVE
Nitrite: NEGATIVE
Protein, ur: 30 mg/dL — AB
Specific Gravity, Urine: 1.013 (ref 1.005–1.030)
pH: 6 (ref 5.0–8.0)

## 2020-02-12 LAB — CBC
HCT: 25.1 % — ABNORMAL LOW (ref 39.0–52.0)
Hemoglobin: 8.5 g/dL — ABNORMAL LOW (ref 13.0–17.0)
MCH: 27.2 pg (ref 26.0–34.0)
MCHC: 33.9 g/dL (ref 30.0–36.0)
MCV: 80.4 fL (ref 80.0–100.0)
Platelets: 319 10*3/uL (ref 150–400)
RBC: 3.12 MIL/uL — ABNORMAL LOW (ref 4.22–5.81)
RDW: 16.3 % — ABNORMAL HIGH (ref 11.5–15.5)
WBC: 21.9 10*3/uL — ABNORMAL HIGH (ref 4.0–10.5)
nRBC: 0 % (ref 0.0–0.2)

## 2020-02-12 LAB — CK: Total CK: <5 U/L — ABNORMAL LOW (ref 49–397)

## 2020-02-12 NOTE — ED Triage Notes (Signed)
Per EMS, patient from [facility] c/o pain to scrotum since this morning. Also c/o chronic pain all over. Patient is wheelchair bound.

## 2020-02-12 NOTE — ED Notes (Signed)
[facility] called for transport.  

## 2020-02-12 NOTE — Discharge Instructions (Signed)
We found no source of infection however we did find bilateral hydrocele you can follow-up with the urologist above.

## 2020-02-12 NOTE — ED Notes (Signed)
Patient refused to provide urine sample.

## 2020-02-12 NOTE — ED Provider Notes (Signed)
[facility]-EMERGENCY DEPT Provider Note   CSN: [id number] Arrival date & time: 02/12/20  1104     History Chief Complaint  Patient presents with  . Testicle Pain    Mark Bullock is a 86 y.o. male.   Testicle Pain This is a new problem. The current episode started 3 to 5 hours ago. The problem occurs constantly. The problem has not changed since onset.Pertinent negatives include no chest pain, no headaches and no shortness of breath. Exacerbated by: diaper brief is too tight. Nothing relieves the symptoms. He has tried nothing for the symptoms. The treatment provided no relief.       Past Medical History:  Diagnosis Date  . AKI (acute kidney injury) (HCC) 05/18/2017  . Diabetes mellitus without complication (HCC)   . Hyperlipidemia   . Hypertension   . Left-sided weakness   . Stroke [facility])     Patient Active Problem List   Diagnosis Date Noted  . Palliative care by specialist   . Sepsis (HCC) 01/02/2020  . High anion gap metabolic acidosis 01/02/2020  . Elevated liver enzymes 01/02/2020  . Total bilirubin, elevated 01/02/2020  . Thrombocytopenia (HCC) 01/02/2020  . Acute hypoxemic respiratory failure (HCC) 01/02/2020  . Acute lower UTI 01/02/2020  . DVT (deep venous thrombosis) (HCC) 01/02/2020  . Septic shock (HCC) 01/02/2020  . Pressure injury of skin 01/02/2020  . Goals of care, counseling/discussion   . Palliative care encounter   . Cellulitis of both lower extremities 09/19/2019  . Cellulitis 09/19/2019  . Chronic systolic CHF (congestive heart failure) (HCC) 03/30/2019  . Leg DVT (deep venous thromboembolism), acute, left (HCC) 03/30/2019  . Acute pulmonary embolism without acute cor pulmonale (HCC) 03/30/2019  . Cellulitis of leg, left 03/29/2019  . Chronic cerebrovascular accident (CVA) 07/07/2017  . Essential hypertension, benign 05/24/2017  . Type II diabetes mellitus with stage 3 chronic kidney disease (HCC) 05/24/2017  .  Dyslipidemia associated with type 2 diabetes mellitus (HCC) 05/24/2017  . Vascular dementia without behavioral disturbance (HCC) 05/24/2017  . Vitamin B 12 deficiency 05/24/2017  . Rhabdomyolysis 05/19/2017  . AKI (acute kidney injury) (HCC) 05/18/2017  . Elevated troponin 05/18/2017  . History of completed stroke 05/18/2017  . CKD (chronic kidney disease), stage III 05/18/2017  . Physical deconditioning 05/08/2017  . Osteoarthritis 05/08/2017  . Bilateral lower extremity edema   . Abnormal EKG   . Elevated brain natriuretic peptide (BNP) level     Past Surgical History:  Procedure Laterality Date  . APPENDECTOMY    . KNEE SURGERY         Family History  Family history unknown: Yes    Social History   Tobacco Use  . Smoking status: Never Smoker  . Smokeless tobacco: Never Used  Substance Use Topics  . Alcohol use: No  . Drug use: No    Home Medications Prior to Admission medications   Medication Sig Start Date End Date Taking? Authorizing Provider  apixaban (ELIQUIS) 5 MG TABS tablet Take 5 mg by mouth 2 (two) times daily.   Yes [provider]  cetirizine (ZYRTEC) 10 MG tablet Take 10 mg by mouth daily as needed for allergies.   Yes [provider]  liver oil-zinc oxide (DESITIN) 40 % ointment Apply 1 application topically in the morning and at bedtime.   Yes [provider]  midodrine (PROAMATINE) 10 MG tablet Take 10 mg by mouth 3 (three) times daily.   Yes [provider]  polyethylene  glycol (MIRALAX / GLYCOLAX) 17 g packet Take 17 g by mouth daily.   Yes [provider]  silver sulfADIAZINE (SILVADENE) 1 % cream Apply 1 application topically daily. Buttocks wounds 02/03/20  Yes [provider]  tamsulosin (FLOMAX) 0.4 MG CAPS capsule Take 1 capsule (0.4 mg total) by mouth at bedtime. 09/25/19  Yes Briant CedarEzenduka, Nkeiruka J, MD  feeding supplement, ENSURE ENLIVE, (ENSURE ENLIVE) LIQD Take 237 mLs by mouth 2 (two) times  daily between meals. Patient not taking: Reported on 02/12/2020 04/01/19   Dhungel, Theda BelfastNishant, MD  hydrocerin (EUCERIN) CREA Apply 1 application topically 2 (two) times daily. Bilateral lower legs Patient taking differently: Apply 1 application topically once a week. Bilateral lower legs Fridays 04/01/19   Eddie Northhungel, Nishant, MD    Allergies    Patient has no known allergies.  Review of Systems   Review of Systems  Constitutional: Negative for chills and fever.  HENT: Negative for congestion and rhinorrhea.   Respiratory: Negative for cough and shortness of breath.   Cardiovascular: Negative for chest pain and palpitations.  Gastrointestinal: Negative for diarrhea, nausea and vomiting.  Genitourinary: Positive for testicular pain. Negative for difficulty urinating and dysuria.  Musculoskeletal: Negative for arthralgias and back pain.  Skin: Negative for color change and rash.  Neurological: Negative for light-headedness and headaches.    Physical Exam Updated Vital Signs BP (!) 105/57   Pulse 74   Temp 98.6 F (37 C) (Oral)   Resp 18   SpO2 100%   Physical Exam Vitals and nursing note reviewed. Exam conducted with a chaperone present.  Constitutional:      General: He is not in acute distress.    Appearance: Normal appearance.  HENT:     Head: Normocephalic and atraumatic.     Nose: No rhinorrhea.  Eyes:     General:        Right eye: No discharge.        Left eye: No discharge.     Conjunctiva/sclera: Conjunctivae normal.  Cardiovascular:     Rate and Rhythm: Normal rate and regular rhythm.  Pulmonary:     Effort: Pulmonary effort is normal.     Breath sounds: No stridor.  Abdominal:     General: Abdomen is flat. There is no distension.     Palpations: Abdomen is soft.  Genitourinary:    Penis: Uncircumcised. Tenderness present. No swelling.      Testes:        Right: Tenderness present.        Left: Tenderness present.  Musculoskeletal:        General: No  deformity or signs of injury.  Skin:    General: Skin is warm and dry.  Neurological:     General: No focal deficit present.     Mental Status: He is alert. Mental status is at baseline.     Motor: No weakness.  Psychiatric:        Mood and Affect: Mood normal.        Behavior: Behavior normal.        Thought Content: Thought content normal.     ED Results / Procedures / Treatments   Labs (all labs ordered are listed, but only abnormal results are displayed) Labs Reviewed  URINALYSIS, ROUTINE W REFLEX MICROSCOPIC - Abnormal; Notable for the following components:      Result Value   Color, Urine AMBER (*)    Hgb urine dipstick SMALL (*)    Protein, ur 30 (*)  All other components within normal limits  BASIC METABOLIC PANEL - Abnormal; Notable for the following components:   Sodium 134 (*)    Glucose, Bld 135 (*)    Calcium 8.3 (*)    GFR calc non Af Amer 57 (*)    All other components within normal limits  CK - Abnormal; Notable for the following components:   Total CK <5 (*)    All other components within normal limits  CBC - Abnormal; Notable for the following components:   WBC 21.9 (*)    RBC 3.12 (*)    Hemoglobin 8.5 (*)    HCT 25.1 (*)    RDW 16.3 (*)    All other components within normal limits    EKG None  Radiology US Scrotum  Result Date: 02/12/2020 CLINICAL DATA:  BILATERAL testicular pain LEFT greater than RIGHT with swelling EXAM: ULTRASOUND OF SCROTUM TECHNIQUE: Complete ultrasound examination of the testicles, epididymis, and other scrotal structures was performed. COMPARISON:  09/11/2011 FINDINGS: Right testicle Measurements: 4.9 x 2.0 x 2.1 cm. Normal echogenicity without mass or calcification. Internal blood flow present on color Doppler imaging. Left testicle Measurements: 3.3 x 2.4 x 1.9 cm. Normal echogenicity without mass or calcification. Internal blood flow present on color Doppler imaging. Right epididymis:  Normal in size and appearance. Left  epididymis:  Normal in size and appearance. Hydrocele: Large BILATERAL hydroceles, RIGHT 10.8 x 6.7 x 6.4 cm and LEFT 8.5 x 2.7 x 3.9 cm. Both demonstrate scattered low level internal echoes [place]/debris. Varicocele:  None visualized. IMPRESSION: Large BILATERAL hydroceles. Normal appearing testes and epididymi. Electronically Signed   By: Ulyses Southward M.D.   On: 02/12/2020 13:41    Procedures Procedures (including critical care time)  Medications Ordered in ED Medications - No data to display  ED Course  I have reviewed the triage vital signs and the nursing notes.  Pertinent labs & imaging results that were available during my care of the patient were reviewed by me and considered in my medical decision making (see chart for details).    MDM Rules/Calculators/A&P                          86 year old male comes from his nursing facility for testicle pain.  He has difficulty localizing but thinks it is worse on the left.  Has tenderness on exam, has a no signs of rash or skin infection on the scrotum or testes.  Mild tenderness to palpation on bilateral testes.  Will get ultrasound we will get urine studies, I spoke to the nursing facility, he has been wearing the same briefs for a long time, the patient feels the brace is too tight I recommended a looser briefs.  He has a cream on his penis that they are using to treat a small spot on his penis that I did not appreciate.  He will get an ultrasound of the scrotum and testicles, he will get urine studies and lab studies.  The blood work is because the facility states he has had generalized muscle weakness and fatigue.  Urinalysis shows no signs of infection.  CBC shows leukocytosis but otherwise unremarkable.  Kidney function appears improved comparedTo prior.  CK is not elevated.  Ultrasound of the testicles shows normal flow and no signs of infection but bilateral hydrocele.  I spoke to this facility again and told him to continue the cream for  the irritated spot on the penis, to adjust  his briefs sized to see if that gives him more comfort.  Follow-up with urology for the hydrocele.  Final Clinical Impression(s) / ED Diagnoses Final diagnoses:  Groin pain, unspecified laterality  Hydrocele in adult    Rx / DC Orders ED Discharge Orders    None       Sabino Donovan, MD 02/12/20 1535

## 2020-02-20 ENCOUNTER — Emergency Department (HOSPITAL_COMMUNITY): Payer: Medicare Other

## 2020-02-20 ENCOUNTER — Emergency Department (HOSPITAL_COMMUNITY)
Admission: EM | Admit: 2020-02-20 | Discharge: 2020-02-21 | Disposition: A | Discharge status: Home / Self Care | Payer: Medicare Other | Attending: Emergency Medicine | Admitting: Emergency Medicine

## 2020-02-20 ENCOUNTER — Encounter (HOSPITAL_COMMUNITY): Payer: Self-pay

## 2020-02-20 ENCOUNTER — Other Ambulatory Visit: Payer: Self-pay

## 2020-02-20 DIAGNOSIS — R5383 Other fatigue: Secondary | ICD-10-CM | POA: Insufficient documentation

## 2020-02-20 DIAGNOSIS — Z7901 Long term (current) use of anticoagulants: Secondary | ICD-10-CM | POA: Diagnosis not present

## 2020-02-20 DIAGNOSIS — Z8673 Personal history of transient ischemic attack (TIA), and cerebral infarction without residual deficits: Secondary | ICD-10-CM | POA: Diagnosis not present

## 2020-02-20 DIAGNOSIS — I5022 Chronic systolic (congestive) heart failure: Secondary | ICD-10-CM | POA: Diagnosis not present

## 2020-02-20 DIAGNOSIS — I130 Hypertensive heart and chronic kidney disease with heart failure and stage 1 through stage 4 chronic kidney disease, or unspecified chronic kidney disease: Secondary | ICD-10-CM | POA: Insufficient documentation

## 2020-02-20 DIAGNOSIS — E1122 Type 2 diabetes mellitus with diabetic chronic kidney disease: Secondary | ICD-10-CM | POA: Insufficient documentation

## 2020-02-20 DIAGNOSIS — R410 Disorientation, unspecified: Secondary | ICD-10-CM | POA: Diagnosis present

## 2020-02-20 DIAGNOSIS — Z79899 Other long term (current) drug therapy: Secondary | ICD-10-CM | POA: Diagnosis not present

## 2020-02-20 DIAGNOSIS — K5900 Constipation, unspecified: Secondary | ICD-10-CM | POA: Insufficient documentation

## 2020-02-20 DIAGNOSIS — N183 Chronic kidney disease, stage 3 unspecified: Secondary | ICD-10-CM | POA: Diagnosis not present

## 2020-02-20 DIAGNOSIS — R5381 Other malaise: Secondary | ICD-10-CM

## 2020-02-20 LAB — COMPREHENSIVE METABOLIC PANEL
ALT: 9 U/L (ref 0–44)
AST: 14 U/L — ABNORMAL LOW (ref 15–41)
Albumin: 1.8 g/dL — ABNORMAL LOW (ref 3.5–5.0)
Alkaline Phosphatase: 218 U/L — ABNORMAL HIGH (ref 38–126)
Anion gap: 6 (ref 5–15)
BUN: 8 mg/dL (ref 8–23)
CO2: 25 mmol/L (ref 22–32)
Calcium: 8 mg/dL — ABNORMAL LOW (ref 8.9–10.3)
Chloride: 103 mmol/L (ref 98–111)
Creatinine, Ser: 0.93 mg/dL (ref 0.61–1.24)
GFR calc Af Amer: >60 mL/min (ref >60)
GFR calc non Af Amer: >60 mL/min (ref >60)
Glucose, Bld: 83 mg/dL (ref 70–99)
Potassium: 3.8 mmol/L (ref 3.5–5.1)
Sodium: 134 mmol/L — ABNORMAL LOW (ref 135–145)
Total Bilirubin: 2.8 mg/dL — ABNORMAL HIGH (ref 0.3–1.2)
Total Protein: 6 g/dL — ABNORMAL LOW (ref 6.5–8.1)

## 2020-02-20 LAB — CBC WITH DIFFERENTIAL/PLATELET
Abs Immature Granulocytes: 0.1 10*3/uL — ABNORMAL HIGH (ref 0.00–0.07)
Basophils Absolute: 0.1 10*3/uL (ref 0.0–0.1)
Basophils Relative: 0 %
Eosinophils Absolute: 0 10*3/uL (ref 0.0–0.5)
Eosinophils Relative: 0 %
HCT: 26.4 % — ABNORMAL LOW (ref 39.0–52.0)
Hemoglobin: 8.5 g/dL — ABNORMAL LOW (ref 13.0–17.0)
Immature Granulocytes: 1 %
Lymphocytes Relative: 12 %
Lymphs Abs: 1.6 10*3/uL (ref 0.7–4.0)
MCH: 27.1 pg (ref 26.0–34.0)
MCHC: 32.2 g/dL (ref 30.0–36.0)
MCV: 84.1 fL (ref 80.0–100.0)
Monocytes Absolute: 1.3 10*3/uL — ABNORMAL HIGH (ref 0.1–1.0)
Monocytes Relative: 10 %
Neutro Abs: 10.8 10*3/uL — ABNORMAL HIGH (ref 1.7–7.7)
Neutrophils Relative %: 77 %
Platelets: 287 10*3/uL (ref 150–400)
RBC: 3.14 MIL/uL — ABNORMAL LOW (ref 4.22–5.81)
RDW: 19.5 % — ABNORMAL HIGH (ref 11.5–15.5)
WBC: 14 10*3/uL — ABNORMAL HIGH (ref 4.0–10.5)
nRBC: 0 % (ref 0.0–0.2)

## 2020-02-20 LAB — POC OCCULT BLOOD, ED: Fecal Occult Bld: NEGATIVE

## 2020-02-20 LAB — URINALYSIS, ROUTINE W REFLEX MICROSCOPIC
Bilirubin Urine: NEGATIVE
Glucose, UA: NEGATIVE mg/dL
Hgb urine dipstick: NEGATIVE
Ketones, ur: NEGATIVE mg/dL
Leukocytes,Ua: NEGATIVE
Nitrite: NEGATIVE
Protein, ur: NEGATIVE mg/dL
Specific Gravity, Urine: 1.01 (ref 1.005–1.030)
pH: 7 (ref 5.0–8.0)

## 2020-02-20 NOTE — Discharge Instructions (Signed)
The testing today did not show any serious problems to explain your discomfort.  Try to eat a regular diet including foods which contain protein.  This could help your swelling, which you are having in your legs and abdomen.  Follow-up with your doctor for checkup next week

## 2020-02-20 NOTE — ED Provider Notes (Signed)
[facility] DEPT Provider Note   CSN: [id number] Arrival date & time: 02/20/20  1847     History Chief Complaint  Patient presents with  . Altered Mental Status  . Cough    Mark Bullock is a 86 y.o. male.  HPI Patient is here for evaluation of fever and "congestion."  He lives in an assisted living facility.  His daughter is here and states that she went to visit him tonight, and at that time he was on his way to the hospital to be evaluated for the previously mentioned problems.  She is unaware of these conditions.  He was recently evaluated in the ED for groin discomfort.  He was discharged after a comprehensive evaluation.  Patient states that he is constipated and has not had a bowel movement for several days.  He denies vomiting, cough, chest pain, shortness of breath, weakness or dizziness.  His daughter states that the patient is at his baseline mental status.  There are no other known modifying factors.    Past Medical History:  Diagnosis Date  . AKI (acute kidney injury) ([place]) 05/18/2017  . Diabetes mellitus without complication ([place])   . Hyperlipidemia   . Hypertension   . Left-sided weakness   . Stroke [facility])     Patient Active Problem List   Diagnosis Date Noted  . Palliative care by specialist   . Sepsis ([place]) 01/02/2020  . High anion gap metabolic acidosis 05/39/7673  . Elevated liver enzymes 01/02/2020  . Total bilirubin, elevated 01/02/2020  . Thrombocytopenia ([place]) 01/02/2020  . Acute hypoxemic respiratory failure ([place]) 01/02/2020  . Acute lower UTI 01/02/2020  . DVT (deep venous thrombosis) ([place]) 01/02/2020  . Septic shock ([place]) 01/02/2020  . Pressure injury of skin 01/02/2020  . Goals of care, counseling/discussion   . Palliative care encounter   . Cellulitis of both lower extremities 09/19/2019  . Cellulitis 09/19/2019  . Chronic systolic CHF (congestive heart failure) ([place]) 03/30/2019  . Leg DVT (deep venous  thromboembolism), acute, left ([place]) 03/30/2019  . Acute pulmonary embolism without acute cor pulmonale ([place]) 03/30/2019  . Cellulitis of leg, left 03/29/2019  . Chronic cerebrovascular accident (CVA) 07/07/2017  . Essential hypertension, benign 05/24/2017  . Type II diabetes mellitus with stage 3 chronic kidney disease ([place]) 05/24/2017  . Dyslipidemia associated with type 2 diabetes mellitus ([place]) 05/24/2017  . Vascular dementia without behavioral disturbance ([place]) 05/24/2017  . Vitamin B 12 deficiency 05/24/2017  . Rhabdomyolysis 05/19/2017  . AKI (acute kidney injury) ([place]) 05/18/2017  . Elevated troponin 05/18/2017  . History of completed stroke 05/18/2017  . CKD (chronic kidney disease), stage III 05/18/2017  . Physical deconditioning 05/08/2017  . Osteoarthritis 05/08/2017  . Bilateral lower extremity edema   . Abnormal EKG   . Elevated brain natriuretic peptide (BNP) level     Past Surgical History:  Procedure Laterality Date  . APPENDECTOMY    . KNEE SURGERY         Family History  Family history unknown: Yes    Social History   Tobacco Use  . Smoking status: Never Smoker  . Smokeless tobacco: Never Used  Substance Use Topics  . Alcohol use: No  . Drug use: No    Home Medications Prior to Admission medications   Medication Sig Start Date End Date Taking? Authorizing Provider  apixaban (ELIQUIS) 5 MG TABS tablet Take 5 mg by mouth 2 (two) times daily.    [provider]  cetirizine (  ZYRTEC) 10 MG tablet Take 10 mg by mouth daily as needed for allergies.    [provider]  feeding supplement, ENSURE ENLIVE, (ENSURE ENLIVE) LIQD Take 237 mLs by mouth 2 (two) times daily between meals. Patient not taking: Reported on 02/12/2020 04/01/19   Dhungel, Flonnie Overman, MD  hydrocerin (EUCERIN) CREA Apply 1 application topically 2 (two) times daily. Bilateral lower legs Patient taking differently: Apply 1 application topically once a week. Bilateral lower  legs Fridays 04/01/19   Dhungel, Flonnie Overman, MD  liver oil-zinc oxide (DESITIN) 40 % ointment Apply 1 application topically in the morning and at bedtime.    [provider]  midodrine (PROAMATINE) 10 MG tablet Take 10 mg by mouth 3 (three) times daily.    [provider]  polyethylene glycol (MIRALAX / GLYCOLAX) 17 g packet Take 17 g by mouth daily.    [provider]  silver sulfADIAZINE (SILVADENE) 1 % cream Apply 1 application topically daily. Buttocks wounds 02/03/20   [provider]  tamsulosin (FLOMAX) 0.4 MG CAPS capsule Take 1 capsule (0.4 mg total) by mouth at bedtime. 09/25/19   Alma Friendly, MD    Allergies    Patient has no known allergies.  Review of Systems   Review of Systems  All other systems reviewed and are negative.   Physical Exam Updated Vital Signs BP 103/66   Pulse 72   Temp 99.1 F (37.3 C) (Oral)   Resp 18   SpO2 97%   Physical Exam Vitals and nursing note reviewed.  Constitutional:      General: He is not in acute distress.    Appearance: He is well-developed. He is not ill-appearing, toxic-appearing or diaphoretic.     Comments: Frail  HENT:     Head: Normocephalic and atraumatic.     Right Ear: External ear normal.     Left Ear: External ear normal.  Eyes:     Conjunctiva/sclera: Conjunctivae normal.     Pupils: Pupils are equal, round, and reactive to light.  Neck:     Trachea: Phonation normal.  Cardiovascular:     Rate and Rhythm: Normal rate and regular rhythm.     Heart sounds: Normal heart sounds.  Pulmonary:     Effort: Pulmonary effort is normal.     Breath sounds: Normal breath sounds.  Abdominal:     General: There is no distension.     Palpations: Abdomen is soft.     Tenderness: There is no abdominal tenderness.  Genitourinary:    Penis: Normal.      Testes: Normal.     Comments: No groin abnormalities visually or palpably.  Normal anus.  Soft brown stool in rectal  vault. Musculoskeletal:        General: Normal range of motion.     Cervical back: Normal range of motion and neck supple.  Skin:    General: Skin is warm and dry.  Neurological:     Mental Status: He is alert and oriented to person, place, and time.     Cranial Nerves: No cranial nerve deficit.     Sensory: No sensory deficit.     Motor: No abnormal muscle tone.     Coordination: Coordination normal.  Psychiatric:        Mood and Affect: Mood normal.        Behavior: Behavior normal.        Thought Content: Thought content normal.        Judgment: Judgment normal.  ED Results / Procedures / Treatments   Labs (all labs ordered are listed, but only abnormal results are displayed) Labs Reviewed  CBC WITH DIFFERENTIAL/PLATELET - Abnormal; Notable for the following components:      Result Value   WBC 14.0 (*)    RBC 3.14 (*)    Hemoglobin 8.5 (*)    HCT 26.4 (*)    RDW 19.5 (*)    Neutro Abs 10.8 (*)    Monocytes Absolute 1.3 (*)    Abs Immature Granulocytes 0.10 (*)    All other components within normal limits  COMPREHENSIVE METABOLIC PANEL - Abnormal; Notable for the following components:   Sodium 134 (*)    Calcium 8.0 (*)    Total Protein 6.0 (*)    Albumin 1.8 (*)    AST 14 (*)    Alkaline Phosphatase 218 (*)    Total Bilirubin 2.8 (*)    All other components within normal limits  URINALYSIS, ROUTINE W REFLEX MICROSCOPIC  POC OCCULT BLOOD, ED    EKG None  Radiology CT Abdomen Pelvis Wo Contrast  Result Date: 02/20/2020 CLINICAL DATA:  Constipation, groin pain EXAM: CT ABDOMEN AND PELVIS WITHOUT CONTRAST TECHNIQUE: Multidetector CT imaging of the abdomen and pelvis was performed following the standard protocol without IV contrast. COMPARISON:  11/21/2018 FINDINGS: Lower chest: Moderate right and small left pleural effusions have developed, incompletely evaluated, with associated bibasilar compressive atelectasis. The visualized central pulmonary arteries  appear enlarged suggesting changes of pulmonary arterial hypertension. Moderate multi-vessel coronary artery calcification is present. Cardiac size is within normal limits. Eventration of the right hemidiaphragm is unchanged. Hepatobiliary: Interval cholecystectomy has been performed. The liver is unremarkable. No intra or extrahepatic biliary ductal dilation. Pancreas: Unremarkable Spleen: Unremarkable Adrenals/Urinary Tract: Multiple simple cortical cysts are seen bilaterally. Nonobstructing 5 mm calculus is seen within the lower pole of the left kidney. No hydronephrosis. No ureteral calculi. Adrenal glands are unremarkable. The bladder is unremarkable. Stomach/Bowel: Stomach, small bowel, and large bowel are unremarkable. No free intraperitoneal gas or fluid is identified. Colonic stool burden is relatively small. The appendix is not clearly identified and may be absent. Vascular/Lymphatic: Mild aortoiliac atherosclerotic calcification is present without evidence of aneurysm. No pathologic adenopathy within the abdomen and pelvis. Reproductive: Mild central prostatic enlargement with indentation of the base of the bladder is again noted. As noted above, the bladder is not distended. Other: Rectum unremarkable. There has developed marked diffuse body wall subcutaneous edema in keeping with anasarca. Similar infiltration is seen within the perirectal fat. Musculoskeletal: Degenerative changes are seen within the lumbar spine. No acute bone abnormality. IMPRESSION: 1. Interval development of marked diffuse body wall subcutaneous edema in keeping with anasarca. 2. Moderate right and small left pleural effusions, incompletely evaluated. 3. Nonobstructing 5 mm left renal calculus. 4. Enlarged central pulmonary arteries suggesting changes of pulmonary arterial hypertension. 5. Aortic atherosclerosis. Aortic Atherosclerosis (ICD10-I70.0). Electronically Signed   By: Fidela Salisbury MD   On: 02/20/2020 20:13   DG Chest  2 View  Result Date: 02/20/2020 CLINICAL DATA:  Congestion EXAM: CHEST - 2 VIEW COMPARISON:  January 04, 2020 FINDINGS: The heart size and mediastinal contours are within normal limits. There is elevation of the right hemidiaphragm. There is subsegmental atelectasis seen at the right base. The left lung is clear. Aortic knob calcifications are seen. No acute osseous abnormality. IMPRESSION: No active cardiopulmonary disease. Electronically Signed   By: Prudencio Pair M.D.   On: 02/20/2020 19:54    Procedures  Procedures (including critical care time)  Medications Ordered in ED Medications - No data to display  ED Course  I have reviewed the triage vital signs and the nursing notes.  Pertinent labs & imaging results that were available during my care of the patient were reviewed by me and considered in my medical decision making (see chart for details).  Clinical Course as of Feb 19 2200  Fri Feb 20, 2020  2046 No bleeding  POC occult blood, ED [EW]  2046 Normal except white count high, hemoglobin low  CBC with Differential(!) [EW]  2053 Per radiologogist, no infiltrate or edema.  DG Chest 2 View [EW]  2107 Normal  Urinalysis, Routine w reflex microscopic Urine, Catheterized [EW]  2151 Normal except sodium low, calcium low, total protein low, albumin low, AST low, alk phos is high, total bilirubin high  Comprehensive metabolic panel(!) [EW]  8527 I discussed the findings with the patient's daughter Jeanett Schlein by telephone.  She had previously been here but had gone home.  She understands that the patient will be returning back to his facility.   [EW]    Clinical Course User Index [EW] Daleen Bo, MD   MDM Rules/Calculators/A&P                           Patient Vitals for the past 24 hrs:  BP Temp Temp src Pulse Resp SpO2  02/20/20 2155 103/66 99.1 F (37.3 C) Oral 72 18 97 %  02/20/20 1931 -- 99.9 F (37.7 C) Rectal -- -- --  02/20/20 1916 113/68 99.1 F (37.3 C) Oral 80 16 97  %    10:01 PM Reevaluation with update and discussion. After initial assessment and treatment, an updated evaluation reveals he continues to be alert and appears comfortable.  I explained to him that I would be talking to his daughter regarding the discharge instructions and planning.  He was agreeable with. Daleen Bo   Medical Decision Making:  This patient is presenting for evaluation of fever and coughing, which does require a range of treatment options, and is a complaint that involves a moderate risk of morbidity and mortality. The differential diagnoses include pneumonia, bronchitis, metabolic instability. I decided to review old records, and in summary elderly male who lives in assisted living facility, and is reported to have had a fever today.  On arrival he was normothermic..  I obtained additional historical information from his daughter at the bedside.  Clinical Laboratory Tests Ordered, included CBC, Metabolic panel and Urinalysis. Review indicates white count high, hemoglobin low protein level, mild elevation of alkaline phosphatase, and total bilirubin. Radiologic Tests Ordered, included chest x-ray, CT abdomen.  I independently Visualized: Radiographic images, which show no evidence for infection, intestinal blockage.  Increased fluid overload, consistent with anasarca.    Critical Interventions-clinical evaluation, laboratory testing, radiographic imaging, observation reassessment  After These Interventions, the Patient was reevaluated and was found patient stable and comfortable.  No clear evidence for acute bacterial infection, or hemodynamic instability.  CRITICAL CARE-no Performed by: Daleen Bo  Nursing Notes Reviewed/ Care Coordinated Applicable Imaging Reviewed Interpretation of Laboratory Data incorporated into ED treatment  The patient appears reasonably screened and/or stabilized for discharge and I doubt any other medical condition or other [facility] requiring  further screening, evaluation, or treatment in the ED at this time prior to discharge.  Plan: Home Medications-continue usual medications; Home Treatments-increase protein in diet; return here if the recommended treatment, does  not improve the symptoms; Recommended follow up-PCP checkup 1 week and as needed     Final Clinical Impression(s) / ED Diagnoses Final diagnoses:  Malaise    Rx / DC Orders ED Discharge Orders    None       Daleen Bo, MD 02/20/20 2202

## 2020-02-20 NOTE — ED Notes (Signed)
Per BIB EMS. Pt coming from nursing home. Per facility nurse pt went to the hospital on Monday for groin pains; since pt has been back pt is more confused and more irritable. Pt is coughing up yellow phlegm.    BP-102/62 Temp-99.4 HR-78 R-18 O2-97%

## 2020-04-05 ENCOUNTER — Encounter (HOSPITAL_BASED_OUTPATIENT_CLINIC_OR_DEPARTMENT_OTHER): Payer: Medicare Other | Admitting: Internal Medicine

## 2020-04-06 ENCOUNTER — Encounter (HOSPITAL_BASED_OUTPATIENT_CLINIC_OR_DEPARTMENT_OTHER): Payer: Medicare Other | Attending: Internal Medicine | Admitting: Internal Medicine

## 2020-04-06 ENCOUNTER — Other Ambulatory Visit: Payer: Self-pay

## 2020-04-06 DIAGNOSIS — L03116 Cellulitis of left lower limb: Secondary | ICD-10-CM | POA: Insufficient documentation

## 2020-04-06 DIAGNOSIS — E1122 Type 2 diabetes mellitus with diabetic chronic kidney disease: Secondary | ICD-10-CM | POA: Insufficient documentation

## 2020-04-06 DIAGNOSIS — I130 Hypertensive heart and chronic kidney disease with heart failure and stage 1 through stage 4 chronic kidney disease, or unspecified chronic kidney disease: Secondary | ICD-10-CM | POA: Insufficient documentation

## 2020-04-06 DIAGNOSIS — Z86718 Personal history of other venous thrombosis and embolism: Secondary | ICD-10-CM | POA: Insufficient documentation

## 2020-04-06 DIAGNOSIS — Z86711 Personal history of pulmonary embolism: Secondary | ICD-10-CM | POA: Diagnosis not present

## 2020-04-06 DIAGNOSIS — L89153 Pressure ulcer of sacral region, stage 3: Secondary | ICD-10-CM | POA: Diagnosis not present

## 2020-04-06 DIAGNOSIS — M1990 Unspecified osteoarthritis, unspecified site: Secondary | ICD-10-CM | POA: Insufficient documentation

## 2020-04-06 DIAGNOSIS — I5022 Chronic systolic (congestive) heart failure: Secondary | ICD-10-CM | POA: Insufficient documentation

## 2020-04-06 DIAGNOSIS — S81801A Unspecified open wound, right lower leg, initial encounter: Secondary | ICD-10-CM | POA: Insufficient documentation

## 2020-04-06 DIAGNOSIS — N183 Chronic kidney disease, stage 3 unspecified: Secondary | ICD-10-CM | POA: Insufficient documentation

## 2020-04-06 DIAGNOSIS — E785 Hyperlipidemia, unspecified: Secondary | ICD-10-CM | POA: Diagnosis not present

## 2020-04-06 DIAGNOSIS — I872 Venous insufficiency (chronic) (peripheral): Secondary | ICD-10-CM | POA: Insufficient documentation

## 2020-04-06 DIAGNOSIS — E11622 Type 2 diabetes mellitus with other skin ulcer: Secondary | ICD-10-CM | POA: Insufficient documentation

## 2020-04-06 DIAGNOSIS — L89323 Pressure ulcer of left buttock, stage 3: Secondary | ICD-10-CM | POA: Insufficient documentation

## 2020-04-06 DIAGNOSIS — Z8673 Personal history of transient ischemic attack (TIA), and cerebral infarction without residual deficits: Secondary | ICD-10-CM | POA: Diagnosis not present

## 2020-04-06 DIAGNOSIS — X58XXXA Exposure to other specified factors, initial encounter: Secondary | ICD-10-CM | POA: Insufficient documentation

## 2020-04-06 DIAGNOSIS — E1151 Type 2 diabetes mellitus with diabetic peripheral angiopathy without gangrene: Secondary | ICD-10-CM | POA: Insufficient documentation

## 2020-04-06 DIAGNOSIS — I890 Lymphedema, not elsewhere classified: Secondary | ICD-10-CM | POA: Insufficient documentation

## 2020-04-06 DIAGNOSIS — Z7901 Long term (current) use of anticoagulants: Secondary | ICD-10-CM | POA: Insufficient documentation
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
## 2020-04-06 NOTE — Progress Notes (Signed)
Mark, Bullock ([id number]) Visit Report for 04/06/2020 Allergy List Details Patient Name: Date of Service: Mark Bullock, Mark Bullock 04/06/2020 1:15 PM Medical Record Number: [id number] Patient Account Number: [id number] Date of Birth/Sex: Treating RN: 22-Apr-1929 (86 y.o. Mark Bullock Primary Care Maribel Hadley: Antony Madura Other Clinician: Referring Jaiceon Collister: Treating Lugenia Assefa/Extender: Louie Casa Weeks in Treatment: 0 Allergies Active Allergies No Known Allergies Allergy Notes Electronic Signature(s) Signed: 04/06/2020 5:46:00 PM By: Zenaida Deed RN, BSN Entered By: Zenaida Deed on 04/06/2020 14:07:34 -------------------------------------------------------------------------------- Arrival Information Details Patient Name: Date of Service: Mark Bullock. 04/06/2020 1:15 PM Medical Record Number: [id number] Patient Account Number: [id number] Date of Birth/Sex: Treating RN: 06/09/1929 (86 y.o. Mark Bullock Primary Care Shatonia Hoots: Antony Madura Other Clinician: Referring Alisha Burgo: Treating Tiberius Loftus/Extender: Aileen Fass in Treatment: 0 Visit Information Patient Arrived: Wheel Chair Arrival Time: 13:50 Accompanied By: daughter Transfer Assistance: None Patient Identification Verified: Yes Secondary Verification Process Completed: Yes Patient Requires Transmission-Based Precautions: No Patient Has Alerts: No History Since Last Visit Electronic Signature(s) Signed: 04/06/2020 5:46:00 PM By: Zenaida Deed RN, BSN Entered By: Zenaida Deed on 04/06/2020 14:01:56 -------------------------------------------------------------------------------- Clinic Level of Care Assessment Details Patient Name: Date of Service: Mark Bullock, Mark Bullock 04/06/2020 1:15 PM Medical Record Number: [id number] Patient Account Number: [id number] Date of Birth/Sex: Treating RN: 1928/09/23 (86 y.o. Mark Bullock Primary Care  Mark Bullock: Antony Madura Other Clinician: Referring Everard Interrante: Treating Nolin Grell/Extender: Louie Casa Weeks in Treatment: 0 Clinic Level of Care Assessment Items TOOL 2 Quantity Score X- 1 0 Use when only an EandM is performed on the INITIAL visit ASSESSMENTS - Nursing Assessment / Reassessment X- 1 20 General Physical Exam (combine w/ comprehensive assessment (listed just below) when performed on new pt. evals) X- 1 25 Comprehensive Assessment (HX, ROS, Risk Assessments, Wounds Hx, etc.) ASSESSMENTS - Wound and Skin A ssessment / Reassessment []  - 0 Simple Wound Assessment / Reassessment - one wound X- 2 5 Complex Wound Assessment / Reassessment - multiple wounds []  - 0 Dermatologic / Skin Assessment (not related to wound area) ASSESSMENTS - Ostomy and/or Continence Assessment and Care []  - 0 Incontinence Assessment and Management []  - 0 Ostomy Care Assessment and Management (repouching, etc.) PROCESS - Coordination of Care X - Simple Patient / Family Education for ongoing care 1 15 []  - 0 Complex (extensive) Patient / Family Education for ongoing care X- 1 10 Staff obtains , Records, T Results / Process Orders est []  - 0 Staff telephones HHA, Nursing Homes / Clarify orders / etc []  - 0 Routine Transfer to another Facility (non-emergent condition) []  - 0 Routine Hospital Admission (non-emergent condition) X- 1 15 New Admissions / / Ordering NPWT Apligraf, etc. , []  - 0 Emergency Hospital Admission (emergent condition) X- 1 10 Simple Discharge Coordination []  - 0 Complex (extensive) Discharge Coordination PROCESS - Special Needs []  - 0 Pediatric / Minor Patient Management []  - 0 Isolation Patient Management []  - 0 Hearing / Language / Visual special needs []  - 0 Assessment of Community assistance (transportation, D/C planning, etc.) []  - 0 Additional assistance / Altered mentation []  - 0 Support  Surface(s) Assessment (bed, cushion, seat, etc.) INTERVENTIONS - Wound Cleansing / Measurement X- 1 5 Wound Imaging (photographs - any number of wounds) []  - 0 Wound Tracing (instead of photographs) []  - 0 Simple Wound Measurement - one wound X- 2 5 Complex Wound Measurement - multiple wounds []  - 0 Simple Wound  Cleansing - one wound X- 2 5 Complex Wound Cleansing - multiple wounds INTERVENTIONS - Wound Dressings []  - 0 Small Wound Dressing one or multiple wounds X- 2 15 Medium Wound Dressing one or multiple wounds []  - 0 Large Wound Dressing one or multiple wounds []  - 0 Application of Medications - injection INTERVENTIONS - Miscellaneous []  - 0 External ear exam []  - 0 Specimen Collection (cultures, biopsies, blood, body fluids, etc.) []  - 0 Specimen(s) / Culture(s) sent or taken to Lab for analysis []  - 0 Patient Transfer (multiple staff / Nurse, adultHoyer Lift / Similar devices) []  - 0 Simple Staple / Suture removal (25 or less) []  - 0 Complex Staple / Suture removal (26 or more) []  - 0 Hypo / Hyperglycemic Management (close monitor of Blood Glucose) X- 1 15 Ankle / Brachial Index (ABI) - do not check if billed separately Has the patient been seen at the hospital within the last three years: Yes Total Score: 175 Level Of Care: New/Established - Level 5 Electronic Signature(s) Signed: 04/06/2020 5:34:55 PM By: Yevonne PaxEpps, Carrie RN Entered By: Yevonne PaxEpps, Carrie on 04/06/2020 15:22:54 -------------------------------------------------------------------------------- Encounter Discharge Information Details Patient Name: Date of Service: Mark MustardRUTCHFIELD, Mark W. 04/06/2020 1:15 PM Medical Record Number: [id number] Patient Account Number: [id number] Date of Birth/Sex: Treating RN: 04-27-1929 (86 y.o. Mark Bullock) Dwiggins, Shannon Primary Care Ciani Rutten: Antony Maduraoberts, [name] W Other Clinician: Referring Sahib Pella: Treating Trimaine Maser/Extender: Louie Casaobson, Michael Roberts, [name] W Weeks in Treatment: 0 Encounter  Discharge Information Items Discharge Condition: Stable Ambulatory Status: Wheelchair Discharge Destination: Home Transportation: Other Accompanied By: self Schedule Follow-up Appointment: Yes Clinical Summary of Care: Patient Declined Electronic Signature(s) Signed: 04/06/2020 5:04:51 PM By: Cherylin Mylarwiggins, Shannon Entered By: Cherylin Mylarwiggins, Shannon on 04/06/2020 15:23:44 -------------------------------------------------------------------------------- Lower Extremity Assessment Details Patient Name: Date of Service: Mark MustardCRUTCHFIELD, Mark W. 04/06/2020 1:15 PM Medical Record Number: [id number] Patient Account Number: [id number] Date of Birth/Sex: Treating RN: 04-27-1929 (86 y.o. Mark SchoonerM) Boehlein, Linda Primary Care Jacarie Pate: Antony Maduraoberts, [name] W Other Clinician: Referring Michaelina Blandino: Treating Linh Johannes/Extender: Louie Casaobson, Michael Roberts, [name] W Weeks in Treatment: 0 Electronic Signature(s) Signed: 04/06/2020 5:46:00 PM By: Zenaida DeedBoehlein, Linda RN, BSN Signed: 04/06/2020 5:46:00 PM By: Zenaida DeedBoehlein, Linda RN, BSN Entered By: Zenaida DeedBoehlein, Linda on 04/06/2020 14:20:09 -------------------------------------------------------------------------------- Multi Wound Chart Details Patient Name: Date of Service: Mark MustardRUTCHFIELD, Makoto W. 04/06/2020 1:15 PM Medical Record Number: [id number] Patient Account Number: [id number] Date of Birth/Sex: Treating RN: 04-27-1929 (86 y.o. Judie PetitM) Jettie PaganEpps, Stafford Springsarrie Primary Care Tenecia Ignasiak: Antony Maduraoberts, [name] W Other Clinician: Referring Thessaly Mccullers: Treating Malachy Coleman/Extender: Louie Casaobson, Michael Roberts, [name] W Weeks in Treatment: 0 Vital Signs Height(in): 68 Pulse(bpm): 80 Weight(lbs): 195 Blood Pressure(mmHg): 143/71 Body Mass Index(BMI): 30 Temperature(F): 97.7 Respiratory Rate(breaths/min): 18 Photos: [7:No Photos Sacrum] [8:No Photos Left Gluteus] [N/A:N/A N/A] Wound Location: [7:Pressure Injury] [8:Pressure Injury] [N/A:N/A] Wounding Event: [7:Pressure Ulcer] [8:Pressure Ulcer] [N/A:N/A] Primary  Etiology: [7:Lymphedema, Congestive Heart] [8:Lymphedema, Congestive Heart] [N/A:N/A] Comorbid History: [7:Failure, Deep Vein Thrombosis, Hypertension, Type II Diabetes, Osteoarthritis 01/22/2020] [8:Failure, Deep Vein Thrombosis, Hypertension, Type II Diabetes, Osteoarthritis 01/22/2020] [N/A:N/A] Date Acquired: [7:0] [8:0] [N/A:N/A] Weeks of Treatment: [7:Open] [8:Open] [N/A:N/A] Wound Status: [7:1.3x1.5x0.8] [8:0.6x0.1x0.1] [N/A:N/A] Measurements L x W x D (cm) [7:1.532] [8:0.047] [N/A:N/A] A (cm) : rea [7:1.225] [8:0.005] [N/A:N/A] Volume (cm) : [7:Category/Stage III] [8:Category/Stage III] [N/A:N/A] Classification: [7:Small] [8:None Present] [N/A:N/A] Exudate A mount: [7:Serosanguineous] [8:N/A] [N/A:N/A] Exudate Type: [7:red, brown] [8:N/A] [N/A:N/A] Exudate Color: [7:Flat and Intact] [8:Flat and Intact] [N/A:N/A] Wound Margin: [7:Large (67-100%)] [8:Large (67-100%)] [N/A:N/A] Granulation A mount: [7:Red, Friable] [8:Pink] [N/A:N/A] Granulation Quality: [7:Small (1-33%)] [8:None Present (0%)] [N/A:N/A] Necrotic A mount: [  7:Fat Layer (Subcutaneous Tissue): Yes Fat Layer (Subcutaneous Tissue): Yes N/A] Exposed Structures: [7:Fascia: No Tendon: No Muscle: No Joint: No Bone: No None] [8:Fascia: No Tendon: No Muscle: No Joint: No Bone: No Medium (34-66%)] [N/A:N/A] Treatment Notes Electronic Signature(s) Signed: 04/06/2020 5:19:28 PM By: Baltazar Najjar MD Signed: 04/06/2020 5:34:55 PM By: [name] RN Entered By: Baltazar Najjar on 04/06/2020 15:02:53 -------------------------------------------------------------------------------- Multi-Disciplinary Care Plan Details Patient Name: Date of Service: Mark Bullock. 04/06/2020 1:15 PM Medical Record Number: [id number] Patient Account Number: [id number] Date of Birth/Sex: Treating RN: [birth year] (86 y.o. Judie Petit) [name] Primary Care Brinae Woods: Other Clinician: Antony Madura Referring Adriaan Maltese: Treating Anevay Campanella/Extender:  Louie Casa Weeks in Treatment: 0 Active Inactive Pressure Nursing Diagnoses: Knowledge deficit related to causes and risk factors for pressure ulcer development Knowledge deficit related to management of pressures ulcers Potential for impaired tissue integrity related to pressure, friction, moisture, and shear Goals: Patient will remain free from development of additional pressure ulcers Date Initiated: 04/06/2020 Target Resolution Date: 05/06/2020 Goal Status: Active Patient/caregiver will verbalize understanding of pressure ulcer management Date Initiated: 04/06/2020 Target Resolution Date: 05/06/2020 Goal Status: Active Interventions: Assess: immobility, friction, shearing, incontinence upon admission and as needed Assess offloading mechanisms upon admission and as needed Assess potential for pressure ulcer upon admission and as needed Notes: Wound/Skin Impairment Nursing Diagnoses: Impaired tissue integrity Knowledge deficit related to smoking impact on wound healing Knowledge deficit related to ulceration/compromised skin integrity Goals: Patient/caregiver will verbalize understanding of skin care regimen Date Initiated: 04/06/2020 Target Resolution Date: 05/06/2020 Goal Status: Active Ulcer/skin breakdown will have a volume reduction of 30% by week 4 Date Initiated: 04/06/2020 Target Resolution Date: 05/06/2020 Goal Status: Active Interventions: Assess patient/caregiver ability to obtain necessary supplies Assess patient/caregiver ability to perform ulcer/skin care regimen upon admission and as needed Assess ulceration(s) every visit Notes: Electronic Signature(s) Signed: 04/06/2020 5:34:55 PM By: [name] RN Entered By: [name] on 04/06/2020 15:12:46 -------------------------------------------------------------------------------- Pain Assessment Details Patient Name: Date of Service: DOMINGOS, RIGGI 04/06/2020 1:15 PM Medical  Record Number: [id number] Patient Account Number: [id number] Date of Birth/Sex: Treating RN: 1929/05/12 (86 y.o. Mark Bullock Primary Care Jaylee Lantry: Antony Madura Other Clinician: Referring Kainalu Heggs: Treating Briah Nary/Extender: Louie Casa Weeks in Treatment: 0 Active Problems Location of Pain Severity and Description of Pain Patient Has Paino Yes Site Locations Pain Location: Generalized Pain, Pain in Ulcers With Dressing Change: No Duration of the Pain. Constant / Intermittento Constant Rate the pain. Current Pain Level: 4 Worst Pain Level: 8 Character of Pain Describe the Pain: Aching Pain Management and Medication Current Pain Management: Medication: Yes Other: reposition Is the Current Pain Management Adequate: Adequate How does your wound impact your activities of daily livingo Sleep: Yes Bathing: No Appetite: No Relationship With Others: No Bladder Continence: No Emotions: No Bowel Continence: No Hobbies: No Toileting: No Dressing: No Electronic Signature(s) Signed: 04/06/2020 5:46:00 PM By: Zenaida Deed RN, BSN Entered By: Zenaida Deed on 04/06/2020 14:35:02 -------------------------------------------------------------------------------- Patient/Caregiver Education Details Patient Name: Date of Service: Mark Bullock 9/28/2021andnbsp1:15 PM Medical Record Number: [id number] Patient Account Number: [id number] Date of Birth/Gender: Treating RN: 05-Nov-1928 (86 y.o. Mark Bullock Primary Care Physician: Antony Madura Other Clinician: Referring Physician: Treating Physician/Extender: Aileen Fass in Treatment: 0 Education Assessment Education Provided To: Patient Education Topics Provided Offloading: Methods: Explain/Verbal Responses: State content correctly Electronic Signature(s) Signed: 04/06/2020 5:34:55 PM By: [name] RN Signed: 04/06/2020 5:34:55 PM By: [name]  RN Entered By: [name] on 04/06/2020 15:12:58 -------------------------------------------------------------------------------- Wound Assessment Details Patient Name: Date of Service: Mark Bullock, Mark Bullock 04/06/2020 1:15 PM Medical Record Number: [id number] Patient Account Number: [id number] Date of Birth/Sex: Treating RN: 05/28/1929 (86 y.o. Mark Bullock Primary Care Rutger Salton: Antony Madura Other Clinician: Referring Saud Bail: Treating Cymone Yeske/Extender: Louie Casa Weeks in Treatment: 0 Wound Status Wound Number: 7 Primary Pressure Ulcer Etiology: Wound Location: Sacrum Wound Open Wounding Event: Pressure Injury Status: Date Acquired: 01/22/2020 Comorbid Lymphedema, Congestive Heart Failure, Deep Vein Thrombosis, Weeks Of Treatment: 0 History: Hypertension, Type II Diabetes, Osteoarthritis Clustered Wound: No Wound Measurements Length: (cm) 1.3 Width: (cm) 1.5 Depth: (cm) 0.8 Area: (cm) 1.532 Volume: (cm) 1.225 % Reduction in Area: % Reduction in Volume: Epithelialization: None Tunneling: No Undermining: No Wound Description Classification: Category/Stage III Wound Margin: Flat and Intact Exudate Amount: Small Exudate Type: Serosanguineous Exudate Color: red, brown Foul Odor After Cleansing: No Slough/Fibrino Yes Wound Bed Granulation Amount: Large (67-100%) Exposed Structure Granulation Quality: Red, Friable Fascia Exposed: No Necrotic Amount: Small (1-33%) Fat Layer (Subcutaneous Tissue) Exposed: Yes Necrotic Quality: Adherent Slough Tendon Exposed: No Muscle Exposed: No Joint Exposed: No Bone Exposed: No Treatment Notes Wound #7 (Sacrum) 1. Cleanse With Wound Cleanser 3. Primary Dressing Applied Calcium Alginate Ag 4. Secondary Dressing Foam Border Dressing Electronic Signature(s) Signed: 04/06/2020 5:46:00 PM By: Zenaida Deed RN, BSN Entered By: Zenaida Deed on 04/06/2020  14:32:39 -------------------------------------------------------------------------------- Wound Assessment Details Patient Name: Date of Service: QUAME, Mark Bullock 04/06/2020 1:15 PM Medical Record Number: [id number] Patient Account Number: [id number] Date of Birth/Sex: Treating RN: 10/13/1928 (86 y.o. Mark Bullock Primary Care Ellaina Schuler: Antony Madura Other Clinician: Referring Dmarion Perfect: Treating Adriana Lina/Extender: Louie Casa Weeks in Treatment: 0 Wound Status Wound Number: 8 Primary Pressure Ulcer Etiology: Wound Location: Left Gluteus Wound Open Wounding Event: Pressure Injury Status: Date Acquired: 01/22/2020 Comorbid Lymphedema, Congestive Heart Failure, Deep Vein Thrombosis, Weeks Of Treatment: 0 History: Hypertension, Type II Diabetes, Osteoarthritis Clustered Wound: No Wound Measurements Length: (cm) 0.6 Width: (cm) 0.1 Depth: (cm) 0.1 Area: (cm) 0.047 Volume: (cm) 0.005 % Reduction in Area: % Reduction in Volume: Epithelialization: Medium (34-66%) Tunneling: No Undermining: No Wound Description Classification: Category/Stage III Wound Margin: Flat and Intact Exudate Amount: None Present Foul Odor After Cleansing: No Slough/Fibrino No Wound Bed Granulation Amount: Large (67-100%) Exposed Structure Granulation Quality: Pink Fascia Exposed: No Necrotic Amount: None Present (0%) Fat Layer (Subcutaneous Tissue) Exposed: Yes Tendon Exposed: No Muscle Exposed: No Joint Exposed: No Bone Exposed: No Treatment Notes Wound #8 (Left Gluteus) 1. Cleanse With Wound Cleanser 3. Primary Dressing Applied Calcium Alginate Ag 4. Secondary Dressing Foam Border Dressing Electronic Signature(s) Signed: 04/06/2020 5:46:00 PM By: Zenaida Deed RN, BSN Entered By: Zenaida Deed on 04/06/2020 14:33:52 -------------------------------------------------------------------------------- Vitals Details Patient Name: Date of  Service: Mark Bullock. 04/06/2020 1:15 PM Medical Record Number: [id number] Patient Account Number: [id number] Date of Birth/Sex: Treating RN: 1928/08/25 (86 y.o. Mark Bullock Primary Care Demarkus Remmel: Antony Madura Other Clinician: Referring Cammeron Greis: Treating Antoine Vandermeulen/Extender: Louie Casa Weeks in Treatment: 0 Vital Signs Time Taken: 14:02 Temperature (F): 97.7 Height (in): 68 Pulse (bpm): 80 Source: Stated Respiratory Rate (breaths/min): 18 Weight (lbs): 195 Blood Pressure (mmHg): 143/71 Source: Stated Reference Range: 80 - 120 mg / dl Body Mass Index (BMI): 29.6 Electronic Signature(s) Signed: 04/06/2020 5:46:00 PM By: Zenaida Deed RN, BSN Entered By: Zenaida Deed on 04/06/2020 14:07:26

## 2020-04-13 ENCOUNTER — Encounter (HOSPITAL_BASED_OUTPATIENT_CLINIC_OR_DEPARTMENT_OTHER): Payer: Medicare Other | Admitting: Internal Medicine

## 2020-04-15 ENCOUNTER — Encounter (HOSPITAL_BASED_OUTPATIENT_CLINIC_OR_DEPARTMENT_OTHER): Payer: Medicare Other | Attending: Internal Medicine | Admitting: Internal Medicine

## 2020-04-15 DIAGNOSIS — I130 Hypertensive heart and chronic kidney disease with heart failure and stage 1 through stage 4 chronic kidney disease, or unspecified chronic kidney disease: Secondary | ICD-10-CM | POA: Insufficient documentation

## 2020-04-15 DIAGNOSIS — Z8673 Personal history of transient ischemic attack (TIA), and cerebral infarction without residual deficits: Secondary | ICD-10-CM | POA: Insufficient documentation

## 2020-04-15 DIAGNOSIS — I5022 Chronic systolic (congestive) heart failure: Secondary | ICD-10-CM | POA: Insufficient documentation

## 2020-04-15 DIAGNOSIS — L89153 Pressure ulcer of sacral region, stage 3: Secondary | ICD-10-CM | POA: Diagnosis not present

## 2020-04-15 DIAGNOSIS — Z7901 Long term (current) use of anticoagulants: Secondary | ICD-10-CM | POA: Diagnosis not present

## 2020-04-15 DIAGNOSIS — L03116 Cellulitis of left lower limb: Secondary | ICD-10-CM | POA: Diagnosis not present

## 2020-04-15 DIAGNOSIS — E1151 Type 2 diabetes mellitus with diabetic peripheral angiopathy without gangrene: Secondary | ICD-10-CM | POA: Diagnosis not present

## 2020-04-15 DIAGNOSIS — E1122 Type 2 diabetes mellitus with diabetic chronic kidney disease: Secondary | ICD-10-CM | POA: Insufficient documentation

## 2020-04-15 DIAGNOSIS — Z86718 Personal history of other venous thrombosis and embolism: Secondary | ICD-10-CM | POA: Diagnosis not present

## 2020-04-15 DIAGNOSIS — N183 Chronic kidney disease, stage 3 unspecified: Secondary | ICD-10-CM | POA: Diagnosis not present

## 2020-04-15 DIAGNOSIS — I872 Venous insufficiency (chronic) (peripheral): Secondary | ICD-10-CM | POA: Diagnosis not present

## 2020-04-15 DIAGNOSIS — L89323 Pressure ulcer of left buttock, stage 3: Secondary | ICD-10-CM | POA: Diagnosis not present

## 2020-04-15 DIAGNOSIS — E11622 Type 2 diabetes mellitus with other skin ulcer: Secondary | ICD-10-CM | POA: Insufficient documentation

## 2020-04-15 DIAGNOSIS — I890 Lymphedema, not elsewhere classified: Secondary | ICD-10-CM | POA: Insufficient documentation

## 2020-04-16 NOTE — Progress Notes (Signed)
Mark Bullock ([id number]) Visit Report for 04/15/2020 Arrival Information Details Patient Name: Date of Service: Mark Bullock, Mark Bullock 04/15/2020 9:15 A M Medical Record Number: [id number] Patient Account Number: [id number] Date of Birth/Sex: Treating RN: 01-27-1929 (86 y.o. Katherina Right Primary Care Khadija Thier: Antony Madura Other Clinician: Referring Alee Gressman: Treating Uzma Hellmer/Extender: Louie Casa Weeks in Treatment: 1 Visit Information History Since Last Visit Added or deleted any medications: No Patient Arrived: Wheel Chair Any new allergies or adverse reactions: No Arrival Time: 10:01 Had a fall or experienced change in No Accompanied By: self activities of daily living that may affect Transfer Assistance: Manual risk of falls: Patient Identification Verified: Yes Signs or symptoms of abuse/neglect since last visito No Secondary Verification Process Completed: Yes Hospitalized since last visit: No Patient Requires Transmission-Based Precautions: No Implantable device outside of the clinic excluding No Patient Has Alerts: No cellular tissue based products placed in the center since last visit: Has Dressing in Place as Prescribed: Yes Pain Present Now: No Electronic Signature(s) Signed: 04/15/2020 5:54:48 PM By: Cherylin Mylar Entered By: Cherylin Mylar on 04/15/2020 10:02:14 -------------------------------------------------------------------------------- Clinic Level of Care Assessment Details Patient Name: Date of Service: Mark Bullock 04/15/2020 9:15 A M Medical Record Number: [id number] Patient Account Number: [id number] Date of Birth/Sex: Treating RN: 1929-04-12 (86 y.o. Harlon Flor, Millard.Loa Primary Care Sammie Denner: Antony Madura Other Clinician: Referring Jacorion Klem: Treating Kris No/Extender: Louie Casa Weeks in Treatment: 1 Clinic Level of Care Assessment Items TOOL 4 Quantity Score X- 1  0 Use when only an EandM is performed on FOLLOW-UP visit ASSESSMENTS - Nursing Assessment / Reassessment X- 1 10 Reassessment of Co-morbidities (includes updates in patient status) X- 1 5 Reassessment of Adherence to Treatment Plan ASSESSMENTS - Wound and Skin A ssessment / Reassessment []  - 0 Simple Wound Assessment / Reassessment - one wound X- 2 5 Complex Wound Assessment / Reassessment - multiple wounds X- 1 10 Dermatologic / Skin Assessment (not related to wound area) ASSESSMENTS - Focused Assessment []  - 0 Circumferential Edema Measurements - multi extremities X- 1 10 Nutritional Assessment / Counseling / Intervention []  - 0 Lower Extremity Assessment (monofilament, tuning fork, pulses) []  - 0 Peripheral Arterial Disease Assessment (using hand held doppler) ASSESSMENTS - Ostomy and/or Continence Assessment and Care []  - 0 Incontinence Assessment and Management []  - 0 Ostomy Care Assessment and Management (repouching, etc.) PROCESS - Coordination of Care []  - 0 Simple Patient / Family Education for ongoing care X- 1 20 Complex (extensive) Patient / Family Education for ongoing care X- 1 10 Staff obtains , Records, T Results / Process Orders est X- 1 10 Staff telephones HHA, Nursing Homes / Clarify orders / etc []  - 0 Routine Transfer to another Facility (non-emergent condition) []  - 0 Routine Hospital Admission (non-emergent condition) []  - 0 New Admissions / / Ordering NPWT Apligraf, etc. , []  - 0 Emergency Hospital Admission (emergent condition) []  - 0 Simple Discharge Coordination X- 1 15 Complex (extensive) Discharge Coordination PROCESS - Special Needs []  - 0 Pediatric / Minor Patient Management []  - 0 Isolation Patient Management []  - 0 Hearing / Language / Visual special needs []  - 0 Assessment of Community assistance (transportation, D/C planning, etc.) []  - 0 Additional assistance / Altered mentation []  -  0 Support Surface(s) Assessment (bed, cushion, seat, etc.) INTERVENTIONS - Wound Cleansing / Measurement []  - 0 Simple Wound Cleansing - one wound X- 2 5 Complex Wound Cleansing - multiple wounds  X- 1 5 Wound Imaging (photographs - any number of wounds) []  - 0 Wound Tracing (instead of photographs) []  - 0 Simple Wound Measurement - one wound X- 2 5 Complex Wound Measurement - multiple wounds INTERVENTIONS - Wound Dressings []  - 0 Small Wound Dressing one or multiple wounds X- 1 15 Medium Wound Dressing one or multiple wounds []  - 0 Large Wound Dressing one or multiple wounds []  - 0 Application of Medications - topical []  - 0 Application of Medications - injection INTERVENTIONS - Miscellaneous []  - 0 External ear exam []  - 0 Specimen Collection (cultures, biopsies, blood, body fluids, etc.) []  - 0 Specimen(s) / Culture(s) sent or taken to Lab for analysis []  - 0 Patient Transfer (multiple staff / Lift / Similar devices) []  - 0 Simple Staple / Suture removal (25 or less) []  - 0 Complex Staple / Suture removal (26 or more) []  - 0 Hypo / Hyperglycemic Management (close monitor of Blood Glucose) []  - 0 Ankle / Brachial Index (ABI) - do not check if billed separately X- 1 5 Vital Signs Has the patient been seen at the hospital within the last three years: Yes Total Score: 145 Level Of Care: New/Established - Level 4 Electronic Signature(s) Signed: 04/15/2020 6:22:31 PM By: Entered By: on 04/15/2020 10:35:52 -------------------------------------------------------------------------------- Encounter Discharge Information Details Patient Name: Date of Service: . 04/15/2020 9:15 A M Medical Record Number: Patient Account Number: Date of Birth/Sex: Treating RN: 07/10/1929 (86 y.o. Primary Care Lenis Nettleton: Other Clinician: Referring Nettye Flegal: Treating  Levis Nazir/Extender: in Treatment: 1 Encounter Discharge Information Items Discharge Condition: Stable Ambulatory Status: Wheelchair Discharge Destination: Home Transportation: Private Auto Accompanied By: alone Schedule Follow-up Appointment: Yes Clinical Summary of Care: Patient Declined Electronic Signature(s) Signed: 04/16/2020 5:41:32 PM By: 06/15/2020 RN, BSN Entered By: Shawn Stall on 04/15/2020 10:58:24 -------------------------------------------------------------------------------- Lower Extremity Assessment Details Patient Name: Date of Service: 06/15/2020. 04/15/2020 9:15 A M Medical Record Number: 06/15/2020 Patient Account Number: [id number] Date of Birth/Sex: Treating RN: 28-Jun-1929 (86 y.o. 92 Primary Care Zyire Eidson: Elizebeth Koller Other Clinician: Referring Raeanna Soberanes: Treating Rosaleah Person/Extender: Antony Madura Weeks in Treatment: 1 Electronic Signature(s) Signed: 04/15/2020 5:54:48 PM By: 06/16/2020 Entered By: Zandra Abts on 04/15/2020 10:02:50 -------------------------------------------------------------------------------- Multi Wound Chart Details Patient Name: Date of Service: 06/15/2020. 04/15/2020 9:15 A M Medical Record Number: 06/15/2020 Patient Account Number: [id number] Date of Birth/Sex: Treating RN: December 17, 1928 (86 y.o. 89, Katherina Right Primary Care Henslee Lottman: Antony Madura Other Clinician: Referring Korry Dalgleish: Treating Shalee Paolo/Extender: Louie Casa Weeks in Treatment: 1 Vital Signs Height(in): 68 Pulse(bpm): 75 Weight(lbs): 195 Blood Pressure(mmHg): 136/74 Body Mass Index(BMI): 30 Temperature(F): 98 Respiratory Rate(breaths/min): 19 Photos: [7:No Photos Sacrum] [8:No Photos Left Gluteus] [N/A:N/A N/A] Wound Location: [7:Pressure Injury] [8:Pressure Injury] [N/A:N/A] Wounding Event: [7:Pressure Ulcer]  [8:Pressure Ulcer] [N/A:N/A] Primary Etiology: [7:Lymphedema, Congestive Heart] [8:Lymphedema, Congestive Heart] [N/A:N/A] Comorbid History: [7:Failure, Deep Vein Thrombosis, Hypertension, Type II Diabetes, Osteoarthritis 01/22/2020] [8:Failure, Deep Vein Thrombosis, Hypertension, Type II Diabetes, Osteoarthritis 01/22/2020] [N/A:N/A] Date Acquired: [7:1] [8:1] [N/A:N/A] Weeks of Treatment: [7:Open] [8:Open] [N/A:N/A] Wound Status: [7:1x1x1.2] [8:0.3x0.2x0.1] [N/A:N/A] Measurements L x W x D (cm) [7:0.785] [8:0.047] [N/A:N/A] A (cm) : rea [7:0.942] [8:0.005] [N/A:N/A] Volume (cm) : [7:48.80%] [8:0.00%] [N/A:N/A] % Reduction in A rea: [7:23.10%] [8:0.00%] [N/A:N/A] % Reduction in Volume: [7:Category/Stage III] [8:Category/Stage III] [N/A:N/A] Classification: [7:Small] [8:None Present] [N/A:N/A]  Exudate A mount: [7:Serosanguineous] [8:N/A] [N/A:N/A] Exudate Type: [7:red, brown] [8:N/A] [N/A:N/A] Exudate Color: [7:Flat and Intact] [8:Flat and Intact] [N/A:N/A] Wound Margin: [7:Large (67-100%)] [8:Large (67-100%)] [N/A:N/A] Granulation A mount: [7:Red, Friable] [8:Pink] [N/A:N/A] Granulation Quality: [7:Small (1-33%)] [8:None Present (0%)] [N/A:N/A] Necrotic A mount: [7:Fat Layer (Subcutaneous Tissue): Yes Fat Layer (Subcutaneous Tissue): Yes N/A] Exposed Structures: [7:Fascia: No Tendon: No Muscle: No Joint: No Bone: No None] [8:Fascia: No Tendon: No Muscle: No Joint: No Bone: No Medium (34-66%)] [N/A:N/A] Treatment Notes Electronic Signature(s) Signed: 04/15/2020 6:22:31 PM By: Shawn Stalleaton, Bobbi Signed: 04/16/2020 8:12:45 AM By: Baltazar Najjarobson, Michael MD Entered By: Baltazar Najjarobson, Michael on 04/15/2020 10:38:48 -------------------------------------------------------------------------------- Multi-Disciplinary Care Plan Details Patient Name: Date of Service: Nadara MustardRUTCHFIELD, Jadian W. 04/15/2020 9:15 A M Medical Record Number: [id number] Patient Account Number: [id number] Date of Birth/Sex: Treating  RN: 1929-06-06 (86 y.o. Harlon FlorM) Deaton, Millard.LoaBobbi Primary Care Brentlee Sciara: Antony Maduraoberts, [name] W Other Clinician: Referring Zannie Runkle: Treating Alyria Krack/Extender: Louie Casaobson, Michael Roberts, [name] W Weeks in Treatment: 1 Active Inactive Pressure Nursing Diagnoses: Knowledge deficit related to causes and risk factors for pressure ulcer development Knowledge deficit related to management of pressures ulcers Potential for impaired tissue integrity related to pressure, friction, moisture, and shear Goals: Patient will remain free from development of additional pressure ulcers Date Initiated: 04/06/2020 Target Resolution Date: 05/06/2020 Goal Status: Active Patient/caregiver will verbalize understanding of pressure ulcer management Date Initiated: 04/06/2020 Target Resolution Date: 05/06/2020 Goal Status: Active Interventions: Assess: immobility, friction, shearing, incontinence upon admission and as needed Assess offloading mechanisms upon admission and as needed Assess potential for pressure ulcer upon admission and as needed Notes: Wound/Skin Impairment Nursing Diagnoses: Impaired tissue integrity Knowledge deficit related to smoking impact on wound healing Knowledge deficit related to ulceration/compromised skin integrity Goals: Patient/caregiver will verbalize understanding of skin care regimen Date Initiated: 04/06/2020 Target Resolution Date: 05/06/2020 Goal Status: Active Ulcer/skin breakdown will have a volume reduction of 30% by week 4 Date Initiated: 04/06/2020 Target Resolution Date: 05/06/2020 Goal Status: Active Interventions: Assess patient/caregiver ability to obtain necessary supplies Assess patient/caregiver ability to perform ulcer/skin care regimen upon admission and as needed Assess ulceration(s) every visit Notes: Electronic Signature(s) Signed: 04/15/2020 6:22:31 PM By: Shawn Stalleaton, Bobbi Entered By: Shawn Stalleaton, Bobbi on 04/15/2020  09:43:05 -------------------------------------------------------------------------------- Pain Assessment Details Patient Name: Date of Service: Nadara MustardCRUTCHFIELD, Shaye W. 04/15/2020 9:15 A M Medical Record Number: [id number] Patient Account Number: [id number] Date of Birth/Sex: Treating RN: 1929-06-06 (86 y.o. Katherina RightM) Dwiggins, Shannon Primary Care Chevonne Bostrom: Antony Maduraoberts, [name] W Other Clinician: Referring Thurley Francesconi: Treating Annesha Delgreco/Extender: Louie Casaobson, Michael Roberts, [name] W Weeks in Treatment: 1 Active Problems Location of Pain Severity and Description of Pain Patient Has Paino No Site Locations Pain Management and Medication Current Pain Management: Electronic Signature(s) Signed: 04/15/2020 5:54:48 PM By: Cherylin Mylarwiggins, Shannon Entered By: Cherylin Mylarwiggins, Shannon on 04/15/2020 10:02:44 -------------------------------------------------------------------------------- Patient/Caregiver Education Details Patient Name: Date of Service: Nadara MustardRUTCHFIELD, Rachard W. 10/7/2021andnbsp9:15 A M Medical Record Number: [id number] Patient Account Number: [id number] Date of Birth/Gender: Treating RN: 1929-06-06 (86 y.o. Tammy SoursM) Deaton, Bobbi Primary Care Physician: Antony Maduraoberts, [name] W Other Clinician: Referring Physician: Treating Physician/Extender: Aileen Fassobson, Michael Roberts, [name] W Weeks in Treatment: 1 Education Assessment Education Provided To: Patient Education Topics Provided Wound/Skin Impairment: Handouts: Skin Care Do's and Dont's Methods: Explain/Verbal Responses: Reinforcements needed Electronic Signature(s) Signed: 04/15/2020 6:22:31 PM By: Shawn Stalleaton, Bobbi Entered By: Shawn Stalleaton, Bobbi on 04/15/2020 09:43:16 -------------------------------------------------------------------------------- Wound Assessment Details Patient Name: Date of Service: Nadara MustardCRUTCHFIELD, Reynolds W. 04/15/2020 9:15 A M Medical Record Number: [id number] Patient Account Number: [id number] Date of Birth/Sex: Treating RN: 1929-06-06 (86 y.o.  Katherina Right Primary Care Prairie Stenberg: Antony Madura Other Clinician: Referring Kinlee Garrison: Treating Dniya Neuhaus/Extender: Louie Casa Weeks in Treatment: 1 Wound Status Wound Number: 7 Primary Pressure Ulcer Etiology: Wound Location: Sacrum Wound Open Wounding Event: Pressure Injury Status: Date Acquired: 01/22/2020 Comorbid Lymphedema, Congestive Heart Failure, Deep Vein Thrombosis, Weeks Of Treatment: 1 History: Hypertension, Type II Diabetes, Osteoarthritis Clustered Wound: No Wound Measurements Length: (cm) 1 Width: (cm) 1 Depth: (cm) 1.2 Area: (cm) 0.785 Volume: (cm) 0.942 % Reduction in Area: 48.8% % Reduction in Volume: 23.1% Epithelialization: None Tunneling: No Undermining: No Wound Description Classification: Category/Stage III Wound Margin: Flat and Intact Exudate Amount: Small Exudate Type: Serosanguineous Exudate Color: red, brown Foul Odor After Cleansing: No Slough/Fibrino Yes Wound Bed Granulation Amount: Large (67-100%) Exposed Structure Granulation Quality: Red, Friable Fascia Exposed: No Necrotic Amount: Small (1-33%) Fat Layer (Subcutaneous Tissue) Exposed: Yes Necrotic Quality: Adherent Slough Tendon Exposed: No Muscle Exposed: No Joint Exposed: No Bone Exposed: No Treatment Notes Wound #7 (Sacrum) 1. Cleanse With Wound Cleanser 3. Primary Dressing Applied [employer] 4. Secondary Dressing Foam Border Dressing Electronic Signature(s) Signed: 04/15/2020 5:54:48 PM By: Cherylin Mylar Entered By: Cherylin Mylar on 04/15/2020 10:03:17 -------------------------------------------------------------------------------- Wound Assessment Details Patient Name: Date of Service: AHMANI, PREHN 04/15/2020 9:15 A M Medical Record Number: [id number] Patient Account Number: [id number] Date of Birth/Sex: Treating RN: 1929-06-23 (86 y.o. Katherina Right Primary Care Hadlei Stitt: Antony Madura Other  Clinician: Referring Shlomo Seres: Treating Quetzali Heinle/Extender: Louie Casa Weeks in Treatment: 1 Wound Status Wound Number: 8 Primary Pressure Ulcer Etiology: Wound Location: Left Gluteus Wound Open Wounding Event: Pressure Injury Status: Date Acquired: 01/22/2020 Comorbid Lymphedema, Congestive Heart Failure, Deep Vein Thrombosis, Weeks Of Treatment: 1 History: Hypertension, Type II Diabetes, Osteoarthritis Clustered Wound: No Wound Measurements Length: (cm) 0.3 Width: (cm) 0.2 Depth: (cm) 0.1 Area: (cm) 0.047 Volume: (cm) 0.005 % Reduction in Area: 0% % Reduction in Volume: 0% Epithelialization: Medium (34-66%) Tunneling: No Undermining: No Wound Description Classification: Category/Stage III Wound Margin: Flat and Intact Exudate Amount: None Present Foul Odor After Cleansing: No Slough/Fibrino No Wound Bed Granulation Amount: Large (67-100%) Exposed Structure Granulation Quality: Pink Fascia Exposed: No Necrotic Amount: None Present (0%) Fat Layer (Subcutaneous Tissue) Exposed: Yes Tendon Exposed: No Muscle Exposed: No Joint Exposed: No Bone Exposed: No Treatment Notes Wound #8 (Left Gluteus) 1. Cleanse With Wound Cleanser 3. Primary Dressing Applied [employer] 4. Secondary Dressing Foam Border Dressing Electronic Signature(s) Signed: 04/15/2020 5:54:48 PM By: Cherylin Mylar Entered By: Cherylin Mylar on 04/15/2020 10:04:35 -------------------------------------------------------------------------------- Vitals Details Patient Name: Date of Service: Nadara Mustard. 04/15/2020 9:15 A M Medical Record Number: [id number] Patient Account Number: [id number] Date of Birth/Sex: Treating RN: 06-27-1929 (86 y.o. Katherina Right Primary Care Annesha Delgreco: Antony Madura Other Clinician: Referring Kaylum Shrum: Treating Orel Cooler/Extender: Louie Casa Weeks in Treatment: 1 Vital Signs Time Taken:  10:00 Temperature (F): 98 Height (in): 68 Pulse (bpm): 75 Weight (lbs): 195 Respiratory Rate (breaths/min): 19 Body Mass Index (BMI): 29.6 Blood Pressure (mmHg): 136/74 Reference Range: 80 - 120 mg / dl Electronic Signature(s) Signed: 04/15/2020 5:54:48 PM By: Cherylin Mylar Entered By: Cherylin Mylar on 04/15/2020 10:02:38

## 2020-04-16 NOTE — Progress Notes (Signed)
Mark Bullock, Mark Bullock ([id number]) Visit Report for 04/15/2020 HPI Details Patient Name: Date of Service: Mark Bullock, Mark Bullock 04/15/2020 9:15 A M Medical Record Number: [id number] Patient Account Number: [id number] Date of Birth/Sex: Treating RN: 1928-08-29 (86 y.o. Mark Bullock Primary Care Provider: Antony Bullock Other Clinician: Referring Provider: Treating Provider/Extender: Mark Bullock Weeks in Treatment: 1 History of Present Illness Location: Patient presents with a wound to right lower leg and the left lower leg Quality: Patient reports experiencing a dull pain to affected area(s). Severity: Mildly severe wound with no evidence of infection Duration: Patient has had the wound for > 3 months prior to seeking treatment at the wound center Timing: Pain in wound is Intermittent (comes and goes Context: The wound would happen gradually Modifying Factors: Patient wound(s)/ulcer(s) are worsening due to :no compression or wrapping ssociated Signs and Symptoms: Patient reports having increase swelling. A HPI Description: 86 year old gentleman who was recently seen in the ER for swelling and inflammation of both lower extremities right was worse than the left. He was evaluated and found to have an ulcerated area in the right posterior mid calf region which is 2.5 cm in diameter. since there was a small amount of cellulitis he was put on doxycycline twice a day and was asked to see the wound center. past medical history significant for diabetes mellitus, hypertension and stroke and he has never been a smoker. Of note he was a patient here last year between September to October 2016 and at that stage he was seen by Dr. Hester Mates and treated conservatively. On looking up his electronic medical records I note that he had a venous duplex study done in October 2016 which showed a right lower extremity deep venous reflux with a normal right greater saphenous vein but  there was an incompetent right short saphenous vein. There was also a right incompetent perforator vein at the medial mid calf level in the region of the ulcer. The patient had arterial duplex studies done which showed a normal ABI of 1.1 on the right and 1.16 on the left and a total brachial index of 1.14 on the right and 0.96 on the left. Notes from his previous visit in 2016: The patient is an 86 yrs old bm here for evaluation of his right leg wound. He is overweight, has diabetes, history of a stroke, severe lymphedema of both legs and peripheral vascular disease. He now has an ulcer of the right leg medially at the lower third of the leg. It does not look infected. He was seen in the ED several weeks ago and referred to the clinic. He had collagen and an UNNA boot placed last week. He still has not received his juxtalites yet. 06/13/2016 -- he was only recently discharged on 05/30/2016 but as expected has not used his compression stockings and has various reasons to excuse himself from doing compression. I do understand that he lives alone and putting them on maybe difficult. However he will not be covered for juxta lites and he does not want to buy these. 06/27/2016 -- his right leg is completely healed but his left leg has opened up again and this is due to lack of wearing his compression stocking. 07/04/2016 -- we continue to have social issues with the application of his compression stockings and regularly changing them. He is helpless, senile and quite adamant about getting outside help. We will refer him to social services. 07/11/2016 -- the wounds have all healed and we  are trying our best to get social services or home health to get involved with his compression stockings but the patient is rather difficult and continues to make excuses. I believe if he does not get his compression stockings back on again he will have recurrent problems which are going to be  unavoidable READMISSION 04/06/2020 This is a now 86 year old man who lives at [facility] assisted living. He has been in the wound clinic previously in two thousand sixteen and then for a prolonged period from 2017-2018. He was discharged on 07/11/2016 this was predominantly for a right posterior calf wound in the setting of chronic venous insufficiency and secondary lymphedema he was discharged with compression stockings. He comes in today for review of pressure ulcers on his sacrum and left gluteal. I am not really sure of the duration here. They are using Silvadene cream and treatment. The patient is reasonably immobile as a walker and a wheelchair. He has a gel cushion on his wheelchair although I am not exactly sure what he has on his mattress if anything. He says he spends most of his time in the wheelchair or in bed. I am not sure about offloading. Past medical history includes a history of CVA, hypertension, type 2 diabetes, stage III chronic renal failure systolic congestive heart failure hyperlipidemia, DVT/PE on Eliquis. In the March 2021 he had cellulitis of the left foot. He has chronic venous insufficiency via reflux studies in two thousand sixteen 10/7; 1 week follow-up for this very frail man from [facility] assisted living. He has wounds on his lower sacrum/coccyx and left upper gluteal in close proximity. We have been using silver collagen and border foam. I am uncertain about the aggressiveness of keeping him off this area Electronic Signature(s) Signed: 04/16/2020 8:12:45 AM By: Baltazar Najjar MD Entered By: Baltazar Najjar on 04/15/2020 10:39:30 -------------------------------------------------------------------------------- Physical Exam Details Patient Name: Date of Service: Mark Bullock. 04/15/2020 9:15 A M Medical Record Number: [id number] Patient Account Number: [id number] Date of Birth/Sex: Treating RN: March 06, 1929 (86 y.o. Mark Bullock Primary Care  Provider: Antony Bullock Other Clinician: Referring Provider: Treating Provider/Extender: Mark Bullock Weeks in Treatment: 1 Constitutional Sitting or standing Blood Pressure is within target range for patient.. Pulse regular and within target range for patient.Marland Kitchen Respirations regular, non-labored and within target range.. Temperature is normal and within the target range for the patient.Marland Kitchen Appears in no distress. Notes Wound exam; this is in a difficult area for this man. If he sits in a wheelchair on this area of the time I do not know that we are going to have an easy time healing this. The wounds themselves do not look too bad. They have healthy granulation and are not overly deep. No surrounding infection. However the patient is relatively immobile. I do not know how he spends his days at the assisted living. Fortunately no infection Electronic Signature(s) Signed: 04/16/2020 8:12:45 AM By: Baltazar Najjar MD Entered By: Baltazar Najjar on 04/15/2020 10:40:51 -------------------------------------------------------------------------------- Physician Orders Details Patient Name: Date of Service: Mark Bullock. 04/15/2020 9:15 A M Medical Record Number: [id number] Patient Account Number: [id number] Date of Birth/Sex: Treating RN: 05/04/1929 (86 y.o. Mark Bullock Primary Care Provider: Antony Bullock Other Clinician: Referring Provider: Treating Provider/Extender: Mark Bullock Weeks in Treatment: 1 Verbal / Phone Orders: No Diagnosis Coding ICD-10 Coding Code Description L89.153 Pressure ulcer of sacral region, stage 3 L89.323 Pressure ulcer of left buttock, stage 3 Follow-up Appointments ppointment  in 2 weeks. - ****Michiel SitesHoyer need extra time.**** Return A Dressing Change Frequency Change dressing three times week. Wound Cleansing May shower with protection. Primary Wound Dressing Wound #7 Sacrum Silver Collagen - moisten  with hydrogel. Wound #8 Left Gluteus Silver Collagen - moisten with hydrogel. Secondary Dressing Wound #7 Sacrum Foam Border Wound #8 Left Gluteus Foam Border Off-Loading Turn and reposition every 2 hours Home Health dmit to [facility] for [facility] - skilled nursing for wound care, PT for repositioning, pressure relief and strengthening. A Electronic Signature(s) Signed: 04/15/2020 6:22:31 PM By: Shawn Stalleaton, Bobbi Signed: 04/16/2020 8:12:45 AM By: Baltazar Najjarobson, Jakiera Ehler MD Entered By: Shawn Stalleaton, Bobbi on 04/15/2020 10:42:52 -------------------------------------------------------------------------------- Problem List Details Patient Name: Date of Service: Mark MustardRUTCHFIELD, Mark W. 04/15/2020 9:15 A M Medical Record Number: [id number] Patient Account Number: [id number] Date of Birth/Sex: Treating RN: [birth year] (86 y.o. Mark SoursM) Deaton, Bobbi Primary Care Provider: Antony Maduraoberts, [name] W Other Clinician: Referring Provider: Treating Provider/Extender: Mark Casaobson, Anelia Carriveau Roberts, [name] W Weeks in Treatment: 1 Active Problems ICD-10 Encounter Code Description Active Date MDM Diagnosis L89.153 Pressure ulcer of sacral region, stage 3 04/06/2020 No Yes L89.323 Pressure ulcer of left buttock, stage 3 04/06/2020 No Yes Inactive Problems Resolved Problems Electronic Signature(s) Signed: 04/16/2020 8:12:45 AM By: Baltazar Najjarobson, Dayona Shaheen MD Entered By: Baltazar Najjarobson, Malessa Zartman on 04/15/2020 10:38:43 -------------------------------------------------------------------------------- Progress Note Details Patient Name: Date of Service: Mark MustardRUTCHFIELD, Mark W. 04/15/2020 9:15 A M Medical Record Number: [id number] Patient Account Number: [id number] Date of Birth/Sex: Treating RN: [birth year] (86 y.o. Mark SoursM) Deaton, Bobbi Primary Care Provider: Antony Maduraoberts, [name] W Other Clinician: Referring Provider: Treating Provider/Extender: Mark Casaobson, Zyrus Hetland Roberts, [name] W Weeks in Treatment: 1 Subjective History of Present Illness (HPI) The  following HPI elements were documented for the patient's wound: Location: Patient presents with a wound to right lower leg and the left lower leg Quality: Patient reports experiencing a dull pain to affected area(s). Severity: Mildly severe wound with no evidence of infection Duration: Patient has had the wound for > 3 months prior to seeking treatment at the wound center Timing: Pain in wound is Intermittent (comes and goes Context: The wound would happen gradually Modifying Factors: Patient wound(s)/ulcer(s) are worsening due to :no compression or wrapping Associated Signs and Symptoms: Patient reports having increase swelling. 86 year old gentleman who was recently seen in the ER for swelling and inflammation of both lower extremities right was worse than the left. He was evaluated and found to have an ulcerated area in the right posterior mid calf region which is 2.5 cm in diameter. since there was a small amount of cellulitis he was put on doxycycline twice a day and was asked to see the wound center. past medical history significant for diabetes mellitus, hypertension and stroke and he has never been a smoker. Of note he was a patient here last year between September to October 2016 and at that stage he was seen by Dr. Hester MatesBillingham and treated conservatively. On looking up his electronic medical records I note that he had a venous duplex study done in October 2016 which showed a right lower extremity deep venous reflux with a normal right greater saphenous vein but there was an incompetent right short saphenous vein. There was also a right incompetent perforator vein at the medial mid calf level in the region of the ulcer. The patient had arterial duplex studies done which showed a normal ABI of 1.1 on the right and 1.16 on the left and a total brachial index of 1.14 on the right and 0.96 on the left.  Notes from his previous visit in 2016: The patient is an 86 yrs old bm here for evaluation  of his right leg wound. He is overweight, has diabetes, history of a stroke, severe lymphedema of both legs and peripheral vascular disease. He now has an ulcer of the right leg medially at the lower third of the leg. It does not look infected. He was seen in the ED several weeks ago and referred to the clinic. He had collagen and an UNNA boot placed last week. He still has not received his juxtalites yet. 06/13/2016 -- he was only recently discharged on 05/30/2016 but as expected has not used his compression stockings and has various reasons to excuse himself from doing compression. I do understand that he lives alone and putting them on maybe difficult. However he will not be covered for juxta lites and he does not want to buy these. 06/27/2016 -- his right leg is completely healed but his left leg has opened up again and this is due to lack of wearing his compression stocking. 07/04/2016 -- we continue to have social issues with the application of his compression stockings and regularly changing them. He is helpless, senile and quite adamant about getting outside help. We will refer him to social services. 07/11/2016 -- the wounds have all healed and we are trying our best to get social services or home health to get involved with his compression stockings but the patient is rather difficult and continues to make excuses. I believe if he does not get his compression stockings back on again he will have recurrent problems which are going to be unavoidable READMISSION 04/06/2020 This is a now 86 year old man who lives at [facility] assisted living. He has been in the wound clinic previously in two thousand sixteen and then for a prolonged period from 2017-2018. He was discharged on 07/11/2016 this was predominantly for a right posterior calf wound in the setting of chronic venous insufficiency and secondary lymphedema he was discharged with compression stockings. He comes in today for review of  pressure ulcers on his sacrum and left gluteal. I am not really sure of the duration here. They are using Silvadene cream and treatment. The patient is reasonably immobile as a walker and a wheelchair. He has a gel cushion on his wheelchair although I am not exactly sure what he has on his mattress if anything. He says he spends most of his time in the wheelchair or in bed. I am not sure about offloading. Past medical history includes a history of CVA, hypertension, type 2 diabetes, stage III chronic renal failure systolic congestive heart failure hyperlipidemia, DVT/PE on Eliquis. In the March 2021 he had cellulitis of the left foot. He has chronic venous insufficiency via reflux studies in two thousand sixteen 10/7; 1 week follow-up for this very frail man from [facility] assisted living. He has wounds on his lower sacrum/coccyx and left upper gluteal in close proximity. We have been using silver collagen and border foam. I am uncertain about the aggressiveness of keeping him off this area Objective Constitutional Sitting or standing Blood Pressure is within target range for patient.. Pulse regular and within target range for patient.Marland Kitchen Respirations regular, non-labored and within target range.. Temperature is normal and within the target range for the patient.Marland Kitchen Appears in no distress. Vitals Time Taken: 10:00 AM, Height: 68 in, Weight: 195 lbs, BMI: 29.6, Temperature: 98 F, Pulse: 75 bpm, Respiratory Rate: 19 breaths/min, Blood Pressure: 136/74 mmHg. General Notes: Wound  exam; this is in a difficult area for this man. If he sits in a wheelchair on this area of the time I do not know that we are going to have an easy time healing this. The wounds themselves do not look too bad. They have healthy granulation and are not overly deep. No surrounding infection. However the patient is relatively immobile. I do not know how he spends his days at the assisted living. Fortunately no  infection Integumentary (Hair, Skin) Wound #7 status is Open. Original cause of wound was Pressure Injury. The wound is located on the Sacrum. The wound measures 1cm length x 1cm width x 1.2cm depth; 0.785cm^2 area and 0.942cm^3 volume. There is Fat Layer (Subcutaneous Tissue) exposed. There is no tunneling or undermining noted. There is a small amount of serosanguineous drainage noted. The wound margin is flat and intact. There is large (67-100%) red, friable granulation within the wound bed. There is a small (1-33%) amount of necrotic tissue within the wound bed including Adherent Slough. Wound #8 status is Open. Original cause of wound was Pressure Injury. The wound is located on the Left Gluteus. The wound measures 0.3cm length x 0.2cm width x 0.1cm depth; 0.047cm^2 area and 0.005cm^3 volume. There is Fat Layer (Subcutaneous Tissue) exposed. There is no tunneling or undermining noted. There is a none present amount of drainage noted. The wound margin is flat and intact. There is large (67-100%) pink granulation within the wound bed. There is no necrotic tissue within the wound bed. Assessment Active Problems ICD-10 Pressure ulcer of sacral region, stage 3 Pressure ulcer of left buttock, stage 3 Plan Follow-up Appointments: Return Appointment in 2 weeks. - ****Michiel Sites need extra time.**** Dressing Change Frequency: Change dressing three times week. Wound Cleansing: May shower with protection. Primary Wound Dressing: Wound #7 Sacrum: Calcium Alginate with Silver Wound #8 Left Gluteus: Calcium Alginate with Silver Secondary Dressing: Wound #7 Sacrum: Foam Border Wound #8 Left Gluteus: Foam Border Off-Loading: Turn and reposition every 2 hours Home Health: Continue Home Health skilled nursing for wound care. - [facility] for skilled nursing for wound care, PT for repositioning, pressure relief and strengthening. 1. Silver collagen with border foam. 2. The  aggressiveness of offloading will dictate whether we have any ability to heal this 3. Fortunately the wounds do not look ominous. Electronic Signature(s) Signed: 04/16/2020 8:12:45 AM By: Baltazar Najjar MD Entered By: Baltazar Najjar on 04/15/2020 10:42:12 -------------------------------------------------------------------------------- SuperBill Details Patient Name: Date of Service: Mark Bullock. 04/15/2020 Medical Record Number: [id number] Patient Account Number: [id number] Date of Birth/Sex: Treating RN: 01-Jul-1929 (86 y.o. Mark Bullock Primary Care Provider: Antony Bullock Other Clinician: Referring Provider: Treating Provider/Extender: Mark Bullock Weeks in Treatment: 1 Diagnosis Coding ICD-10 Codes Code Description [id number] Pressure ulcer of sacral region, stage 3 L89.323 Pressure ulcer of left buttock, stage 3 Facility Procedures CPT4 Code: [id number] Description: 99214 - WOUND CARE VISIT-LEV 4 EST PT Modifier: Quantity: 1 Physician Procedures : CPT4 Code Description Modifier [id number] - WC PHYS LEVEL 3 - EST PT 1 ICD-10 Diagnosis Description L89.153 Pressure ulcer of sacral region, stage 3 L89.323 Pressure ulcer of left buttock, stage 3 Quantity: Electronic Signature(s) Signed: 04/16/2020 8:12:45 AM By: Baltazar Najjar MD Entered By: Baltazar Najjar on 04/15/2020 10:42:46

## 2020-04-29 ENCOUNTER — Encounter (HOSPITAL_BASED_OUTPATIENT_CLINIC_OR_DEPARTMENT_OTHER): Payer: Medicare Other | Admitting: Internal Medicine

## 2020-05-25 ENCOUNTER — Other Ambulatory Visit: Payer: Self-pay

## 2020-05-25 ENCOUNTER — Encounter (HOSPITAL_BASED_OUTPATIENT_CLINIC_OR_DEPARTMENT_OTHER): Payer: Medicare Other | Attending: Internal Medicine | Admitting: Internal Medicine

## 2020-05-25 DIAGNOSIS — N183 Chronic kidney disease, stage 3 unspecified: Secondary | ICD-10-CM | POA: Insufficient documentation

## 2020-05-25 DIAGNOSIS — I5022 Chronic systolic (congestive) heart failure: Secondary | ICD-10-CM | POA: Insufficient documentation

## 2020-05-25 DIAGNOSIS — Z86711 Personal history of pulmonary embolism: Secondary | ICD-10-CM | POA: Diagnosis not present

## 2020-05-25 DIAGNOSIS — I890 Lymphedema, not elsewhere classified: Secondary | ICD-10-CM | POA: Insufficient documentation

## 2020-05-25 DIAGNOSIS — E1122 Type 2 diabetes mellitus with diabetic chronic kidney disease: Secondary | ICD-10-CM | POA: Diagnosis not present

## 2020-05-25 DIAGNOSIS — I1310 Hypertensive heart and chronic kidney disease without heart failure, with stage 1 through stage 4 chronic kidney disease, or unspecified chronic kidney disease: Secondary | ICD-10-CM | POA: Insufficient documentation

## 2020-05-25 DIAGNOSIS — E1151 Type 2 diabetes mellitus with diabetic peripheral angiopathy without gangrene: Secondary | ICD-10-CM | POA: Diagnosis not present

## 2020-05-25 DIAGNOSIS — Z86718 Personal history of other venous thrombosis and embolism: Secondary | ICD-10-CM | POA: Diagnosis not present

## 2020-05-25 DIAGNOSIS — L89153 Pressure ulcer of sacral region, stage 3: Secondary | ICD-10-CM | POA: Diagnosis present

## 2020-05-25 DIAGNOSIS — Z8673 Personal history of transient ischemic attack (TIA), and cerebral infarction without residual deficits: Secondary | ICD-10-CM | POA: Insufficient documentation

## 2020-05-25 NOTE — Progress Notes (Signed)
Mark, Bullock ([id number]) Visit Report for 05/25/2020 HPI Details Patient Name: Date of Service: Mark Bullock, Mark Bullock 05/25/2020 3:00 PM Medical Record Number: [id number] Patient Account Number: [id number] Date of Birth/Sex: Treating RN: Mark 17, [name] Mark Bullock Primary Care Provider: Burton Bullock Other Clinician: Referring Provider: Treating Provider/Extender: Mark Bullock in Treatment: 7 History of Present Illness Location: Patient presents with a wound to right lower leg and the left lower leg Quality: Patient reports experiencing a dull pain to affected area(s). Severity: Mildly severe wound with no evidence of infection Duration: Patient has had the wound for > 3 months prior to seeking treatment at the wound center Timing: Pain in wound is Intermittent (comes and goes Context: The wound would happen gradually Modifying Factors: Patient wound(s)/ulcer(s) are worsening due to :no compression or wrapping ssociated Signs and Symptoms: Patient reports having increase swelling. A HPI Description: 86 year old gentleman who was recently seen in the ER for swelling and inflammation of both lower extremities right was worse than the left. He was evaluated and found to have an ulcerated area in the right posterior mid calf region which is 2.5 cm in diameter. since there was a small amount of cellulitis he was put on doxycycline twice a day and was asked to see the wound center. past medical history significant for diabetes mellitus, hypertension and stroke and he has never been a smoker. Of note he was a patient here last year between September to October 2016 and at that stage he was seen by Dr. Hester Bullock and treated conservatively. On looking up his electronic medical records I note that he had a venous duplex study done in October 2016 which showed a right lower extremity deep venous reflux with a normal right greater saphenous vein but there  was an incompetent right short saphenous vein. There was also a right incompetent perforator vein at the medial mid calf level in the region of the ulcer. The patient had arterial duplex studies done which showed a normal ABI of 1.1 on the right and 1.16 on the left and a total brachial index of 1.14 on the right and 0.96 on the left. Notes from his previous visit in 2016: The patient is an 86 yrs old bm here for evaluation of his right leg wound. He is overweight, has diabetes, history of a stroke, severe lymphedema of both legs and peripheral vascular disease. He now has an ulcer of the right leg medially at the lower third of the leg. It does not look infected. He was seen in the ED several weeks ago and referred to the clinic. He had collagen and an UNNA boot placed last week. He still has not received his juxtalites yet. 06/13/2016 -- he was only recently discharged on 05/30/2016 but as expected has not used his compression stockings and has various reasons to excuse himself from doing compression. I do understand that he lives alone and putting them on maybe difficult. However he will not be covered for juxta lites and he does not want to buy these. 06/27/2016 -- his right leg is completely healed but his left leg has opened up again and this is due to lack of wearing his compression stocking. 07/04/2016 -- we continue to have social issues with the application of his compression stockings and regularly changing them. He is helpless, senile and quite adamant about getting outside help. We will refer him to social services. 07/11/2016 -- the wounds have all healed and we are trying our  best to get social services or home health to get involved with his compression stockings but the patient is rather difficult and continues to make excuses. I believe if he does not get his compression stockings back on again he will have recurrent problems which are going to be  unavoidable READMISSION 04/06/2020 This is a now 64 year old man who lives at [facility] assisted living. He has been in the wound clinic previously in two thousand sixteen and then for a prolonged period from 2017-2018. He was discharged on 07/11/2016 this was predominantly for a right posterior calf wound in the setting of chronic venous insufficiency and secondary lymphedema he was discharged with compression stockings. He comes in today for review of pressure ulcers on his sacrum and left gluteal. I am not really sure of the duration here. They are using Silvadene cream and treatment. The patient is reasonably immobile as a walker and a wheelchair. He has a gel cushion on his wheelchair although I am not exactly sure what he has on his mattress if anything. He says he spends most of his time in the wheelchair or in bed. I am not sure about offloading. Past medical history includes a history of CVA, hypertension, type 2 diabetes, stage III chronic renal failure systolic congestive heart failure hyperlipidemia, DVT/PE on Eliquis. In the March 2021 he had cellulitis of the left foot. He has chronic venous insufficiency via reflux studies in two thousand sixteen 10/7; 1 week follow-up for this very frail man from [facility] assisted living. He has wounds on his lower sacrum/coccyx and left upper gluteal in close proximity. We have been using silver collagen and border foam. I am uncertain about the aggressiveness of keeping him off this area 11/16; 5-week follow-up. I am not exactly sure why in any case he is still at [facility] he still has [facility] home [facility]. He had wounds on the lower sacrum/coccyx and the left upper gluteal. The left upper gluteal is closed. As far as we know there is still using silver collagen and foam over the coccyx. This has a maximum depth of 0.7 cm but it is improved from last time both in terms of depth and surface area . I went over pressure relief with  him I am not sure how compliant he is. We apparently did receive phone calls from home health about the condition of his legs although there is no open wound. He has severe bilateral lymphedema but our intake nurse could not determine that there were any wounds. He is clearly not wearing stockings Electronic Signature(s) Signed: 05/25/2020 4:54:15 PM By: Baltazar Najjar MD Entered By: Baltazar Najjar on 05/25/2020 16:36:46 -------------------------------------------------------------------------------- Physical Exam Details Patient Name: Date of Service: Mark Bullock. 05/25/2020 3:00 PM Medical Record Number: [id number] Patient Account Number: [id number] Date of Birth/Sex: Treating RN: Nov 19, 1928 (86 y.o. [name] Primary Care Provider: Burton Bullock Other Clinician: Referring Provider: Treating Provider/Extender: Mark Bullock in Treatment: 7 Constitutional Sitting or standing Blood Pressure is within target range for patient.. Pulse regular and within target range for patient.Marland Kitchen Respirations regular, non-labored and within target range.. Temperature is normal and within the target range for the patient.Marland Kitchen Appears in no distress. Cardiovascular Edema present in both extremities. This is severe nonpitting but no obvious open wound.. Notes Wound exam; patient has 1 remaining wound in the lower sacrum 0.7 cm of depth but this is come in quite a bit small wound does not probe to bone no evidence of  surrounding infection. A lot of the surface of this wound is actually less than 0.7 cm however there is a small opening in the middle that goes deeper. No drainage no cultures were felt to be Electronic Signature(s) Signed: 05/25/2020 4:54:15 PM By: Baltazar Najjar MD Entered By: Baltazar Najjar on 05/25/2020 16:38:23 -------------------------------------------------------------------------------- Physician Orders Details Patient Name: Date of  Service: Mark Bullock. 05/25/2020 3:00 PM Medical Record Number: [id number] Patient Account Number: [id number] Date of Birth/Sex: Treating RN: 12-14-1928 (86 y.o. [name] Primary Care Provider: Burton Bullock Other Clinician: Referring Provider: Treating Provider/Extender: Mark Bullock in Treatment: 7 Verbal / Phone Orders: No Diagnosis Coding ICD-10 Coding Code Description L89.153 Pressure ulcer of sacral region, stage 3 L89.323 Pressure ulcer of left buttock, stage 3 Follow-up Appointments Return appointment in 1 month. - ****Michiel Sites need extra time.**** Dressing Change Frequency Change dressing three times week. Wound Cleansing May shower with protection. Primary Wound Dressing Wound #7 Sacrum Silver Collagen - moisten with hydrogel. Secondary Dressing Wound #7 Sacrum Foam Border Off-Loading Turn and reposition every 2 hours Home Health Continue Home Health skilled nursing for wound care[facility] - [facility]) Signed: 05/25/2020 4:54:15 PM By: Baltazar Najjar MD Signed: 05/25/2020 5:00:30 PM By: Mark Pax RN Entered By: Mark Bullock on 05/25/2020 16:01:56 -------------------------------------------------------------------------------- Problem List Details Patient Name: Date of Service: Mark Bullock. 05/25/2020 3:00 PM Medical Record Number: [id number] Patient Account Number: [id number] Date of Birth/Sex: Treating RN: Mark 15, 1930 (86 y.o. [name] Primary Care Provider: Burton Bullock Other Clinician: Referring Provider: Treating Provider/Extender: Mark Bullock in Treatment: 7 Active Problems ICD-10 Encounter Code Description Active Date MDM Diagnosis L89.153 Pressure ulcer of sacral region, stage 3 04/06/2020 No Yes L89.323 Pressure ulcer of left buttock, stage 3 04/06/2020 No Yes Inactive Problems Resolved Problems Electronic Signature(s) Signed: 05/25/2020 4:54:15  PM By: Baltazar Najjar MD Entered By: Baltazar Najjar on 05/25/2020 16:35:04 -------------------------------------------------------------------------------- Progress Note Details Patient Name: Date of Service: Mark Bullock. 05/25/2020 3:00 PM Medical Record Number: [id number] Patient Account Number: [id number] Date of Birth/Sex: Treating RN: 09-14-1928 (86 y.o. [name] Primary Care Provider: Burton Bullock Other Clinician: Referring Provider: Treating Provider/Extender: Mark Bullock in Treatment: 7 Subjective History of Present Illness (HPI) The following HPI elements were documented for the patient's wound: Location: Patient presents with a wound to right lower leg and the left lower leg Quality: Patient reports experiencing a dull pain to affected area(s). Severity: Mildly severe wound with no evidence of infection Duration: Patient has had the wound for > 3 months prior to seeking treatment at the wound center Timing: Pain in wound is Intermittent (comes and goes Context: The wound would happen gradually Modifying Factors: Patient wound(s)/ulcer(s) are worsening due to :no compression or wrapping Associated Signs and Symptoms: Patient reports having increase swelling. 85 year old gentleman who was recently seen in the ER for swelling and inflammation of both lower extremities right was worse than the left. He was evaluated and found to have an ulcerated area in the right posterior mid calf region which is 2.5 cm in diameter. since there was a small amount of cellulitis he was put on doxycycline twice a day and was asked to see the wound center. past medical history significant for diabetes mellitus, hypertension and stroke and he has never been a smoker. Of note he was a patient here last year between September to October 2016 and at that stage he was seen by Dr. Hester Bullock and treated  conservatively. On looking up his electronic medical  records I note that he had a venous duplex study done in October 2016 which showed a right lower extremity deep venous reflux with a normal right greater saphenous vein but there was an incompetent right short saphenous vein. There was also a right incompetent perforator vein at the medial mid calf level in the region of the ulcer. The patient had arterial duplex studies done which showed a normal ABI of 1.1 on the right and 1.16 on the left and a total brachial index of 1.14 on the right and 0.96 on the left. Notes from his previous visit in 2016: The patient is an 86 yrs old bm here for evaluation of his right leg wound. He is overweight, has diabetes, history of a stroke, severe lymphedema of both legs and peripheral vascular disease. He now has an ulcer of the right leg medially at the lower third of the leg. It does not look infected. He was seen in the ED several weeks ago and referred to the clinic. He had collagen and an UNNA boot placed last week. He still has not received his juxtalites yet. 06/13/2016 -- he was only recently discharged on 05/30/2016 but as expected has not used his compression stockings and has various reasons to excuse himself from doing compression. I do understand that he lives alone and putting them on maybe difficult. However he will not be covered for juxta lites and he does not want to buy these. 06/27/2016 -- his right leg is completely healed but his left leg has opened up again and this is due to lack of wearing his compression stocking. 07/04/2016 -- we continue to have social issues with the application of his compression stockings and regularly changing them. He is helpless, senile and quite adamant about getting outside help. We will refer him to social services. 07/11/2016 -- the wounds have all healed and we are trying our best to get social services or home health to get involved with his compression stockings but the patient is rather difficult and  continues to make excuses. I believe if he does not get his compression stockings back on again he will have recurrent problems which are going to be unavoidable READMISSION 04/06/2020 This is a now 86 year old man who lives at [facility] assisted living. He has been in the wound clinic previously in two thousand sixteen and then for a prolonged period from 2017-2018. He was discharged on 07/11/2016 this was predominantly for a right posterior calf wound in the setting of chronic venous insufficiency and secondary lymphedema he was discharged with compression stockings. He comes in today for review of pressure ulcers on his sacrum and left gluteal. I am not really sure of the duration here. They are using Silvadene cream and treatment. The patient is reasonably immobile as a walker and a wheelchair. He has a gel cushion on his wheelchair although I am not exactly sure what he has on his mattress if anything. He says he spends most of his time in the wheelchair or in bed. I am not sure about offloading. Past medical history includes a history of CVA, hypertension, type 2 diabetes, stage III chronic renal failure systolic congestive heart failure hyperlipidemia, DVT/PE on Eliquis. In the March 2021 he had cellulitis of the left foot. He has chronic venous insufficiency via reflux studies in two thousand sixteen 10/7; 1 week follow-up for this very frail man from [facility] assisted living. He has wounds on his  lower sacrum/coccyx and left upper gluteal in close proximity. We have been using silver collagen and border foam. I am uncertain about the aggressiveness of keeping him off this area 11/16; 5-week follow-up. I am not exactly sure why in any case he is still at [facility] he still has [facility] home [facility]. He had wounds on the lower sacrum/coccyx and the left upper gluteal. The left upper gluteal is closed. As far as we know there is still using silver collagen and foam over the  coccyx. This has a maximum depth of 0.7 cm but it is improved from last time both in terms of depth and surface area . I went over pressure relief with him I am not sure how compliant he is. We apparently did receive phone calls from home health about the condition of his legs although there is no open wound. He has severe bilateral lymphedema but our intake nurse could not determine that there were any wounds. He is clearly not wearing stockings Objective Constitutional Sitting or standing Blood Pressure is within target range for patient.. Pulse regular and within target range for patient.Marland Kitchen Respirations regular, non-labored and within target range.. Temperature is normal and within the target range for the patient.Marland Kitchen Appears in no distress. Vitals Time Taken: 2:59 PM, Height: 68 in, Source: Stated, Weight: 195 lbs, Source: Stated, BMI: 29.6, Temperature: 99.2 F, Pulse: 79 bpm, Respiratory Rate: 18 breaths/min, Blood Pressure: 129/78 mmHg. Cardiovascular Edema present in both extremities. This is severe nonpitting but no obvious open wound.. General Notes: Wound exam; patient has 1 remaining wound in the lower sacrum 0.7 cm of depth but this is come in quite a bit small wound does not probe to bone no evidence of surrounding infection. A lot of the surface of this wound is actually less than 0.7 cm however there is a small opening in the middle that goes deeper. No drainage no cultures were felt to be Integumentary (Hair, Skin) Wound #7 status is Open. Original cause of wound was Pressure Injury. The wound is located on the Sacrum. The wound measures 0.8cm length x 0.4cm width x 0.7cm depth; 0.251cm^2 area and 0.176cm^3 volume. There is Fat Layer (Subcutaneous Tissue) exposed. There is no tunneling or undermining noted. There is a small amount of serosanguineous drainage noted. The wound margin is flat and intact. There is large (67-100%) red granulation within the wound bed. There is no  necrotic tissue within the wound bed. Wound #8 status is Healed - Epithelialized. Original cause of wound was Pressure Injury. The wound is located on the Left Gluteus. The wound measures 0cm length x 0cm width x 0cm depth; 0cm^2 area and 0cm^3 volume. There is Fat Layer (Subcutaneous Tissue) exposed. There is no tunneling or undermining noted. There is a none present amount of drainage noted. The wound margin is flat and intact. There is no granulation within the wound bed. There is no necrotic tissue within the wound bed. Assessment Active Problems ICD-10 Pressure ulcer of sacral region, stage 3 Pressure ulcer of left buttock, stage 3 Plan Follow-up Appointments: Return appointment in 1 month. - ****Hoyer need extra time.**** Dressing Change Frequency: Change dressing three times week. Wound Cleansing: May shower with protection. Primary Wound Dressing: Wound #7 Sacrum: Silver Collagen - moisten with hydrogel. Secondary Dressing: Wound #7 Sacrum: Foam Border Off-Loading: Turn and reposition every 2 hours Home Health: Continue Home Health skilled nursing for wound care. - [facility] 1. Continue with silver collagen moistened with hydrogel under border foam 2.  Home health is changing the dressing. 3. I talked to the patient about offloading this he expressed understanding states he is doing the best he can. I am not really sure how good that is done I do not have any independent information however his wounds are better and one of them is healed on the left gluteal 4. We will see him back in a month Electronic Signature(s) Signed: 05/25/2020 4:54:15 PM By: Baltazar Najjarobson, Vance Belcourt MD Entered By: Baltazar Najjarobson, Gerrit Rafalski on 05/25/2020 16:39:20 -------------------------------------------------------------------------------- SuperBill Details Patient Name: Date of Service: Mark MustardRUTCHFIELD, Torris W. 05/25/2020 Medical Record Number: [id number] Patient Account Number: [id number] Date of Birth/Sex: Treating  RN: 08-05-1928 (86 y.o. [name]) Epps, Carrie Primary Care Provider: [name], [name] Other Clinician: Referring Provider: Treating Provider/Extender: Mark Sizerobson, Veora Fonte Roberts, [name] Weeks in Treatment: 7 Diagnosis Coding ICD-10 Codes Code Description [phone] Pressure ulcer of sacral region, stage 3 L89.323 Pressure ulcer of left buttock, stage 3 Facility Procedures CPT4 Code: [id number] Description: 99213 - WOUND CARE VISIT-LEV 3 EST PT Modifier: Quantity: 1 Physician Procedures : CPT4 Code Description Modifier [id number] 99213 - WC PHYS LEVEL 3 - EST PT ICD-10 Diagnosis Description L89.153 Pressure ulcer of sacral region, stage 3 L89.323 Pressure ulcer of left buttock, stage 3 Quantity: 1 Electronic Signature(s) Signed: 05/25/2020 4:54:15 PM By: Baltazar Najjarobson, Jadelyn Elks MD Entered By: Baltazar Najjarobson, Wrigley Plasencia on 05/25/2020 16:39:35

## 2020-05-25 NOTE — Progress Notes (Signed)
Mark, Bullock ([id number]) Visit Report for 05/25/2020 Arrival Information Details Patient Name: Date of Service: Mark Bullock, Mark Bullock 05/25/2020 3:00 PM Medical Record Number: [id number] Patient Account Number: [id number] Date of Birth/Sex: Treating RN: 06-12-1929 (86 y.o. Mark Bullock Primary Care Jadie Comas: Burton Apley Other Clinician: Referring Ionna Avis: Treating Merissa Renwick/Extender: Nichola Sizer in Treatment: 7 Visit Information History Since Last Visit Added or deleted any medications: No Patient Arrived: Wheel Chair Any new allergies or adverse reactions: No Arrival Time: 14:55 Had a fall or experienced change in No Accompanied By: self activities of daily living that may affect Transfer Assistance: EasyPivot Patient Lift risk of falls: Patient Identification Verified: Yes Signs or symptoms of abuse/neglect since last visito No Secondary Verification Process Completed: Yes Hospitalized since last visit: No Patient Requires Transmission-Based Precautions: No Implantable device outside of the clinic excluding No Patient Has Alerts: No cellular tissue based products placed in the center since last visit: Has Dressing in Place as Prescribed: Yes Pain Present Now: Yes Electronic Signature(s) Signed: 05/25/2020 5:21:16 PM By: Zenaida Deed RN, BSN Entered By: Zenaida Deed on 05/25/2020 14:59:46 -------------------------------------------------------------------------------- Clinic Level of Care Assessment Details Patient Name: Date of Service: Mark Bullock, Mark Bullock. 05/25/2020 3:00 PM Medical Record Number: [id number] Patient Account Number: [id number] Date of Birth/Sex: Treating RN: 05-15-1929 (86 y.o. Mark Bullock) [name] Primary Care Haevyn Ury: Burton Apley Other Clinician: Referring Silvio Sausedo: Treating Adalaide Jaskolski/Extender: Nichola Sizer in Treatment: 7 Clinic Level of Care Assessment Items TOOL 4 Quantity  Score X- 1 0 Use when only an EandM is performed on FOLLOW-UP visit ASSESSMENTS - Nursing Assessment / Reassessment X- 1 10 Reassessment of Co-morbidities (includes updates in patient status) X- 1 5 Reassessment of Adherence to Treatment Plan ASSESSMENTS - Wound and Skin A ssessment / Reassessment []  - 0 Simple Wound Assessment / Reassessment - one wound X- 2 5 Complex Wound Assessment / Reassessment - multiple wounds []  - 0 Dermatologic / Skin Assessment (not related to wound area) ASSESSMENTS - Focused Assessment []  - 0 Circumferential Edema Measurements - multi extremities []  - 0 Nutritional Assessment / Counseling / Intervention []  - 0 Lower Extremity Assessment (monofilament, tuning fork, pulses) []  - 0 Peripheral Arterial Disease Assessment (using hand held doppler) ASSESSMENTS - Ostomy and/or Continence Assessment and Care []  - 0 Incontinence Assessment and Management []  - 0 Ostomy Care Assessment and Management (repouching, etc.) PROCESS - Coordination of Care X - Simple Patient / Family Education for ongoing care 1 15 []  - 0 Complex (extensive) Patient / Family Education for ongoing care X- 1 10 Staff obtains , Records, T Results / Process Orders est []  - 0 Staff telephones HHA, Nursing Homes / Clarify orders / etc []  - 0 Routine Transfer to another Facility (non-emergent condition) []  - 0 Routine Hospital Admission (non-emergent condition) []  - 0 New Admissions / / Ordering NPWT Apligraf, etc. , []  - 0 Emergency Hospital Admission (emergent condition) X- 1 10 Simple Discharge Coordination []  - 0 Complex (extensive) Discharge Coordination PROCESS - Special Needs []  - 0 Pediatric / Minor Patient Management []  - 0 Isolation Patient Management []  - 0 Hearing / Language / Visual special needs []  - 0 Assessment of Community assistance (transportation, D/C planning, etc.) []  - 0 Additional assistance / Altered  mentation []  - 0 Support Surface(s) Assessment (bed, cushion, seat, etc.) INTERVENTIONS - Wound Cleansing / Measurement []  - 0 Simple Wound Cleansing - one wound X- 2 5 Complex Wound Cleansing - multiple wounds X-  1 5 Wound Imaging (photographs - any number of wounds) []  - 0 Wound Tracing (instead of photographs) []  - 0 Simple Wound Measurement - one wound X- 2 5 Complex Wound Measurement - multiple wounds INTERVENTIONS - Wound Dressings []  - 0 Small Wound Dressing one or multiple wounds X- 1 15 Medium Wound Dressing one or multiple wounds []  - 0 Large Wound Dressing one or multiple wounds X- 1 5 Application of Medications - topical []  - 0 Application of Medications - injection INTERVENTIONS - Miscellaneous []  - 0 External ear exam []  - 0 Specimen Collection (cultures, biopsies, blood, body fluids, etc.) []  - 0 Specimen(s) / Culture(s) sent or taken to Lab for analysis []  - 0 Patient Transfer (multiple staff / Nurse, adultHoyer Lift / Similar devices) []  - 0 Simple Staple / Suture removal (25 or less) []  - 0 Complex Staple / Suture removal (26 or more) []  - 0 Hypo / Hyperglycemic Management (close monitor of Blood Glucose) []  - 0 Ankle / Brachial Index (ABI) - do not check if billed separately X- 1 5 Vital Signs Has the patient been seen at the hospital within the last three years: Yes Total Score: 110 Level Of Care: New/Established - Level 3 Electronic Signature(s) Signed: 05/25/2020 5:00:30 PM By: Yevonne PaxEpps, Carrie RN Entered By: Yevonne PaxEpps, Carrie on 05/25/2020 16:05:59 -------------------------------------------------------------------------------- Encounter Discharge Information Details Patient Name: Date of Service: Mark Bullock, Mark W. 05/25/2020 3:00 PM Medical Record Number: [id number] Patient Account Number: [id number] Date of Birth/Sex: Treating RN: May 05, 1929 (86 y.o. Mark Bullock) Epps, Carrie Primary Care Alia Parsley: [name], [name] Other Clinician: Referring Vernella Niznik: Treating  Taysen Bushart/Extender: Nichola Sizerobson, Michael Roberts, [name] Weeks in Treatment: 7 Encounter Discharge Information Items Discharge Condition: Stable Ambulatory Status: Wheelchair Discharge Destination: Home Transportation: Private Auto Accompanied By: self Schedule Follow-up Appointment: Yes Clinical Summary of Care: Patient Declined Electronic Signature(s) Signed: 05/25/2020 4:23:26 PM By: Benjaman KindlerJones, Dedrick EMT/HBOT/SD Entered By: Benjaman KindlerJones, Dedrick on 05/25/2020 16:23:12 -------------------------------------------------------------------------------- Lower Extremity Assessment Details Patient Name: Date of Service: Mark Bullock, Mark W. 05/25/2020 3:00 PM Medical Record Number: [id number] Patient Account Number: [id number] Date of Birth/Sex: Treating RN: May 05, 1929 (86 y.o. Mark SchoonerM) Boehlein, Linda Primary Care Lowen Barringer: [name], [name] Other Clinician: Referring Violette Morneault: Treating Quanasia Defino/Extender: Nichola Sizerobson, Michael Roberts, [name] Weeks in Treatment: 7 Electronic Signature(s) Signed: 05/25/2020 5:21:16 PM By: Zenaida DeedBoehlein, Linda RN, BSN Entered By: Zenaida DeedBoehlein, Linda on 05/25/2020 15:04:36 -------------------------------------------------------------------------------- Multi Wound Chart Details Patient Name: Date of Service: Mark Bullock, Mark W. 05/25/2020 3:00 PM Medical Record Number: [id number] Patient Account Number: [id number] Date of Birth/Sex: Treating RN: May 05, 1929 (86 y.o. Mark PetitM) Yevonne PaxEpps, Carrie Primary Care Vladimir Lenhoff: [name], [name] Other Clinician: Referring Sheletha Bow: Treating Shakeria Robinette/Extender: Nichola Sizerobson, Michael Roberts, [name] Weeks in Treatment: 7 Vital Signs Height(in): 68 Pulse(bpm): 79 Weight(lbs): 195 Blood Pressure(mmHg): 129/78 Body Mass Index(BMI): 30 Temperature(F): 99.2 Respiratory Rate(breaths/min): 18 Photos: [7:No Photos Sacrum] [8:No Photos Left Gluteus] [N/A:N/A N/A] Wound Location: [7:Pressure Injury] [8:Pressure Injury] [N/A:N/A] Wounding Event: [7:Pressure Ulcer]  [8:Pressure Ulcer] [N/A:N/A] Primary Etiology: [7:Lymphedema, Congestive Heart] [8:Lymphedema, Congestive Heart] [N/A:N/A] Comorbid History: [7:Failure, Deep Vein Thrombosis, Hypertension, Type II Diabetes, Osteoarthritis 01/22/2020] [8:Failure, Deep Vein Thrombosis, Hypertension, Type II Diabetes, Osteoarthritis 01/22/2020] [N/A:N/A] Date Acquired: [7:7] [8:7] [N/A:N/A] Weeks of Treatment: [7:Open] [8:Healed - Epithelialized] [N/A:N/A] Wound Status: [7:0.8x0.4x0.7] [8:0x0x0] [N/A:N/A] Measurements L x W x D (cm) [7:0.251] [8:0] [N/A:N/A] A (cm) : rea [7:0.176] [8:0] [N/A:N/A] Volume (cm) : [7:83.60%] [8:100.00%] [N/A:N/A] % Reduction in A rea: [7:85.60%] [8:100.00%] [N/A:N/A] % Reduction in Volume: [7:Category/Stage III] [8:Category/Stage III] [N/A:N/A] Classification: [7:Small] [8:None Present] [N/A:N/A] Exudate A mount: [7:Serosanguineous] [8:N/A] [N/A:N/A]  Exudate Type: [7:red, brown] [8:N/A] [N/A:N/A] Exudate Color: [7:Flat and Intact] [8:Flat and Intact] [N/A:N/A] Wound Margin: [7:Large (67-100%)] [8:None Present (0%)] [N/A:N/A] Granulation A mount: [7:Red] [8:N/A] [N/A:N/A] Granulation Quality: [7:None Present (0%)] [8:None Present (0%)] [N/A:N/A] Necrotic A mount: [7:Fat Layer (Subcutaneous Tissue): Yes Fat Layer (Subcutaneous Tissue): Yes N/A] Exposed Structures: [7:Fascia: No Tendon: No Muscle: No Joint: No Bone: No Small (1-33%)] [8:Fascia: No Tendon: No Muscle: No Joint: No Bone: No Large (67-100%)] [N/A:N/A] Treatment Notes Wound #7 (Sacrum) 1. Cleanse With Wound Cleanser 3. Primary Dressing Applied Collegen AG 4. Secondary Dressing Foam Border Dressing Electronic Signature(s) Signed: 05/25/2020 4:54:15 PM By: Baltazar Najjar MD Signed: 05/25/2020 5:00:30 PM By: [name] RN Entered By: Baltazar Najjar on 05/25/2020 16:35:12 -------------------------------------------------------------------------------- Multi-Disciplinary Care Plan Details Patient Name: Date of  Service: Mark Bullock. 05/25/2020 3:00 PM Medical Record Number: [id number] Patient Account Number: [id number] Date of Birth/Sex: Treating RN: [birth year] (86 y.o. Mark [place] Primary Care Jamiah Homeyer: Burton Apley Other Clinician: Referring Eyonna Sandstrom: Treating Donalyn Schneeberger/Extender: Nichola Sizer in Treatment: 7 Active Inactive Wound/Skin Impairment Nursing Diagnoses: Impaired tissue integrity Knowledge deficit related to smoking impact on wound healing Knowledge deficit related to ulceration/compromised skin integrity Goals: Patient/caregiver will verbalize understanding of skin care regimen Date Initiated: 04/06/2020 Target Resolution Date: 06/07/2020 Goal Status: Active Ulcer/skin breakdown will have a volume reduction of 30% by week 4 Date Initiated: 04/06/2020 Date Inactivated: 05/25/2020 Target Resolution Date: 05/06/2020 Goal Status: Unmet Unmet Reason: comorbities Ulcer/skin breakdown will have a volume reduction of 50% by week 8 Date Initiated: 05/25/2020 Target Resolution Date: 06/07/2020 Goal Status: Active Interventions: Assess patient/caregiver ability to obtain necessary supplies Assess patient/caregiver ability to perform ulcer/skin care regimen upon admission and as needed Assess ulceration(s) every visit Notes: Electronic Signature(s) Signed: 05/25/2020 5:00:30 PM By: [name] RN Entered By: [name] on 05/25/2020 14:53:45 -------------------------------------------------------------------------------- Pain Assessment Details Patient Name: Date of Service: Mark Bullock, Mark Bullock 05/25/2020 3:00 PM Medical Record Number: [id number] Patient Account Number: [id number] Date of Birth/Sex: Treating RN: 1929/04/26 (86 y.o. Mark Bullock Primary Care Mariadel Mruk: Burton Apley Other Clinician: Referring Sylvia Helms: Treating Naquita Nappier/Extender: Nichola Sizer in Treatment: 7 Active  Problems Location of Pain Severity and Description of Pain Patient Has Paino Yes Site Locations Pain Location: Pain Location: Generalized Pain Duration of the Pain. Constant / Intermittento Constant Rate the pain. Current Pain Level: 6 Character of Pain Describe the Pain: Other: tingling Pain Management and Medication Current Pain Management: Is the Current Pain Management Adequate: Adequate How does your wound impact your activities of daily livingo Sleep: No Bathing: No Appetite: No Relationship With Others: No Bladder Continence: No Emotions: No Bowel Continence: No Work: No Toileting: No Drive: No Dressing: No Hobbies: No Notes reports tingling in his hands and fingers Electronic Signature(s) Signed: 05/25/2020 5:21:16 PM By: Zenaida Deed RN, BSN Entered By: Zenaida Deed on 05/25/2020 15:03:48 -------------------------------------------------------------------------------- Patient/Caregiver Education Details Patient Name: Date of Service: Mark Bullock 11/16/2021andnbsp3:00 PM Medical Record Number: [id number] Patient Account Number: [id number] Date of Birth/Gender: Treating RN: 01-08-1929 (86 y.o. Mark [place] Primary Care Physician: Burton Apley Other Clinician: Referring Physician: Treating Physician/Extender: Nichola Sizer in Treatment: 7 Education Assessment Education Provided To: Patient Education Topics Provided Wound/Skin Impairment: Methods: Explain/Verbal Responses: State content correctly Electronic Signature(s) Signed: 05/25/2020 5:00:30 PM By: [name] RN Entered By: [name] on 05/25/2020 14:54:03 -------------------------------------------------------------------------------- Wound Assessment Details Patient Name: Date of Service: Mark Bullock, Mark Bullock 05/25/2020 3:00 PM Medical Record Number: [id number]  Patient Account Number: [id number] Date of Birth/Sex: Treating RN: 17-Dec-1928 (86  y.o. Mark Bullock) [name] Primary Care Linard Daft: Burton Apley Other Clinician: Referring Karan Ramnauth: Treating Duy Lemming/Extender: Nichola Sizer in Treatment: 7 Wound Status Wound Number: 7 Primary Pressure Ulcer Etiology: Wound Location: Sacrum Wound Open Wounding Event: Pressure Injury Status: Date Acquired: 01/22/2020 Comorbid Lymphedema, Congestive Heart Failure, Deep Vein Thrombosis, Weeks Of Treatment: 7 History: Hypertension, Type II Diabetes, Osteoarthritis Clustered Wound: No Wound Measurements Length: (cm) 0.8 Width: (cm) 0.4 Depth: (cm) 0.7 Area: (cm) 0.251 Volume: (cm) 0.176 % Reduction in Area: 83.6% % Reduction in Volume: 85.6% Epithelialization: Small (1-33%) Tunneling: No Undermining: No Wound Description Classification: Category/Stage III Wound Margin: Flat and Intact Exudate Amount: Small Exudate Type: Serosanguineous Exudate Color: red, brown Foul Odor After Cleansing: No Slough/Fibrino Yes Wound Bed Granulation Amount: Large (67-100%) Exposed Structure Granulation Quality: Red Fascia Exposed: No Necrotic Amount: None Present (0%) Fat Layer (Subcutaneous Tissue) Exposed: Yes Tendon Exposed: No Muscle Exposed: No Joint Exposed: No Bone Exposed: No Treatment Notes Wound #7 (Sacrum) 1. Cleanse With Wound Cleanser 3. Primary Dressing Applied Collegen AG 4. Secondary Dressing Foam Border Dressing Electronic Signature(s) Signed: 05/25/2020 5:00:30 PM By: [name] RN Entered By: [name] on 05/25/2020 16:00:27 -------------------------------------------------------------------------------- Wound Assessment Details Patient Name: Date of Service: Mark Bullock, Mark Bullock 05/25/2020 3:00 PM Medical Record Number: [id number] Patient Account Number: [id number] Date of Birth/Sex: Treating RN: 1929/01/16 (86 y.o. Mark Bullock Primary Care Meegan Shanafelt: Burton Apley Other Clinician: Referring Kord Monette: Treating  Jeorgia Helming/Extender: Nichola Sizer in Treatment: 7 Wound Status Wound Number: 8 Primary Pressure Ulcer Etiology: Wound Location: Left Gluteus Wound Healed - Epithelialized Wounding Event: Pressure Injury Status: Date Acquired: 01/22/2020 Comorbid Lymphedema, Congestive Heart Failure, Deep Vein Thrombosis, Weeks Of Treatment: 7 History: Hypertension, Type II Diabetes, Osteoarthritis Clustered Wound: No Wound Measurements Length: (cm) Width: (cm) Depth: (cm) Area: (cm) Volume: (cm) 0 % Reduction in Area: 100% 0 % Reduction in Volume: 100% 0 Epithelialization: Large (67-100%) 0 Tunneling: No 0 Undermining: No Wound Description Classification: Category/Stage III Wound Margin: Flat and Intact Exudate Amount: None Present Foul Odor After Cleansing: No Slough/Fibrino No Wound Bed Granulation Amount: None Present (0%) Exposed Structure Necrotic Amount: None Present (0%) Fascia Exposed: No Fat Layer (Subcutaneous Tissue) Exposed: Yes Tendon Exposed: No Muscle Exposed: No Joint Exposed: No Bone Exposed: No Electronic Signature(s) Signed: 05/25/2020 5:21:16 PM By: Zenaida Deed RN, BSN Entered By: Zenaida Deed on 05/25/2020 15:08:12 -------------------------------------------------------------------------------- Vitals Details Patient Name: Date of Service: Mark Bullock. 05/25/2020 3:00 PM Medical Record Number: [id number] Patient Account Number: [id number] Date of Birth/Sex: Treating RN: [birth year] (86 y.o. Mark Bullock Primary Care Josephene Marrone: Burton Apley Other Clinician: Referring Tianne Plott: Treating Haily Caley/Extender: Nichola Sizer in Treatment: 7 Vital Signs Time Taken: 14:59 Temperature (F): 99.2 Height (in): 68 Pulse (bpm): 79 Source: Stated Respiratory Rate (breaths/min): 18 Weight (lbs): 195 Blood Pressure (mmHg): 129/78 Source: Stated Reference Range: 80 - 120 mg / dl Body Mass  Index (BMI): 29.6 Electronic Signature(s) Signed: 05/25/2020 5:21:16 PM By: Zenaida Deed RN, BSN Entered By: Zenaida Deed on 05/25/2020 15:00:25

## 2020-06-22 ENCOUNTER — Encounter (HOSPITAL_BASED_OUTPATIENT_CLINIC_OR_DEPARTMENT_OTHER): Payer: Medicare Other | Attending: Internal Medicine | Admitting: Internal Medicine

## 2020-10-02 ENCOUNTER — Emergency Department (HOSPITAL_COMMUNITY): Payer: Medicare (Managed Care)

## 2020-10-02 ENCOUNTER — Inpatient Hospital Stay (HOSPITAL_COMMUNITY)
Admission: EM | Admit: 2020-10-02 | Discharge: 2020-10-08 | DRG: 388 | Disposition: E | Payer: Medicare (Managed Care) | Attending: Pulmonary Disease | Admitting: Pulmonary Disease

## 2020-10-02 ENCOUNTER — Encounter (HOSPITAL_COMMUNITY): Payer: Self-pay | Admitting: Emergency Medicine

## 2020-10-02 DIAGNOSIS — I69354 Hemiplegia and hemiparesis following cerebral infarction affecting left non-dominant side: Secondary | ICD-10-CM

## 2020-10-02 DIAGNOSIS — N179 Acute kidney failure, unspecified: Secondary | ICD-10-CM

## 2020-10-02 DIAGNOSIS — I5022 Chronic systolic (congestive) heart failure: Secondary | ICD-10-CM | POA: Diagnosis not present

## 2020-10-02 DIAGNOSIS — E46 Unspecified protein-calorie malnutrition: Secondary | ICD-10-CM | POA: Insufficient documentation

## 2020-10-02 DIAGNOSIS — I9589 Other hypotension: Secondary | ICD-10-CM | POA: Diagnosis not present

## 2020-10-02 DIAGNOSIS — Z86711 Personal history of pulmonary embolism: Secondary | ICD-10-CM

## 2020-10-02 DIAGNOSIS — I4901 Ventricular fibrillation: Secondary | ICD-10-CM | POA: Diagnosis not present

## 2020-10-02 DIAGNOSIS — Z86718 Personal history of other venous thrombosis and embolism: Secondary | ICD-10-CM

## 2020-10-02 DIAGNOSIS — F0150 Vascular dementia without behavioral disturbance: Secondary | ICD-10-CM | POA: Diagnosis present

## 2020-10-02 DIAGNOSIS — Z9049 Acquired absence of other specified parts of digestive tract: Secondary | ICD-10-CM

## 2020-10-02 DIAGNOSIS — E872 Acidosis, unspecified: Secondary | ICD-10-CM

## 2020-10-02 DIAGNOSIS — N1831 Chronic kidney disease, stage 3a: Secondary | ICD-10-CM | POA: Diagnosis present

## 2020-10-02 DIAGNOSIS — E785 Hyperlipidemia, unspecified: Secondary | ICD-10-CM | POA: Diagnosis present

## 2020-10-02 DIAGNOSIS — E860 Dehydration: Secondary | ICD-10-CM | POA: Diagnosis present

## 2020-10-02 DIAGNOSIS — K56600 Partial intestinal obstruction, unspecified as to cause: Secondary | ICD-10-CM | POA: Diagnosis not present

## 2020-10-02 DIAGNOSIS — E1169 Type 2 diabetes mellitus with other specified complication: Secondary | ICD-10-CM | POA: Diagnosis present

## 2020-10-02 DIAGNOSIS — K5651 Intestinal adhesions [bands], with partial obstruction: Principal | ICD-10-CM | POA: Diagnosis present

## 2020-10-02 DIAGNOSIS — Z7901 Long term (current) use of anticoagulants: Secondary | ICD-10-CM

## 2020-10-02 DIAGNOSIS — Z515 Encounter for palliative care: Secondary | ICD-10-CM

## 2020-10-02 DIAGNOSIS — R64 Cachexia: Secondary | ICD-10-CM | POA: Diagnosis present

## 2020-10-02 DIAGNOSIS — K56609 Unspecified intestinal obstruction, unspecified as to partial versus complete obstruction: Secondary | ICD-10-CM

## 2020-10-02 DIAGNOSIS — E876 Hypokalemia: Secondary | ICD-10-CM | POA: Diagnosis present

## 2020-10-02 DIAGNOSIS — J9690 Respiratory failure, unspecified, unspecified whether with hypoxia or hypercapnia: Secondary | ICD-10-CM

## 2020-10-02 DIAGNOSIS — I468 Cardiac arrest due to other underlying condition: Secondary | ICD-10-CM | POA: Diagnosis not present

## 2020-10-02 DIAGNOSIS — E1122 Type 2 diabetes mellitus with diabetic chronic kidney disease: Secondary | ICD-10-CM | POA: Diagnosis present

## 2020-10-02 DIAGNOSIS — N390 Urinary tract infection, site not specified: Secondary | ICD-10-CM | POA: Diagnosis present

## 2020-10-02 DIAGNOSIS — K838 Other specified diseases of biliary tract: Secondary | ICD-10-CM | POA: Diagnosis present

## 2020-10-02 DIAGNOSIS — N400 Enlarged prostate without lower urinary tract symptoms: Secondary | ICD-10-CM | POA: Diagnosis not present

## 2020-10-02 DIAGNOSIS — R627 Adult failure to thrive: Secondary | ICD-10-CM | POA: Diagnosis present

## 2020-10-02 DIAGNOSIS — R578 Other shock: Secondary | ICD-10-CM | POA: Diagnosis not present

## 2020-10-02 DIAGNOSIS — I472 Ventricular tachycardia: Secondary | ICD-10-CM | POA: Diagnosis not present

## 2020-10-02 DIAGNOSIS — D638 Anemia in other chronic diseases classified elsewhere: Secondary | ICD-10-CM | POA: Insufficient documentation

## 2020-10-02 DIAGNOSIS — Z66 Do not resuscitate: Secondary | ICD-10-CM | POA: Diagnosis not present

## 2020-10-02 DIAGNOSIS — R778 Other specified abnormalities of plasma proteins: Secondary | ICD-10-CM | POA: Diagnosis present

## 2020-10-02 DIAGNOSIS — E861 Hypovolemia: Secondary | ICD-10-CM | POA: Diagnosis not present

## 2020-10-02 DIAGNOSIS — Z09 Encounter for follow-up examination after completed treatment for conditions other than malignant neoplasm: Secondary | ICD-10-CM

## 2020-10-02 DIAGNOSIS — Z20822 Contact with and (suspected) exposure to covid-19: Secondary | ICD-10-CM | POA: Diagnosis present

## 2020-10-02 DIAGNOSIS — I4891 Unspecified atrial fibrillation: Secondary | ICD-10-CM | POA: Diagnosis present

## 2020-10-02 DIAGNOSIS — Z6820 Body mass index (BMI) 20.0-20.9, adult: Secondary | ICD-10-CM

## 2020-10-02 DIAGNOSIS — Z79899 Other long term (current) drug therapy: Secondary | ICD-10-CM

## 2020-10-02 DIAGNOSIS — L89152 Pressure ulcer of sacral region, stage 2: Secondary | ICD-10-CM | POA: Diagnosis present

## 2020-10-02 DIAGNOSIS — R54 Age-related physical debility: Secondary | ICD-10-CM | POA: Diagnosis present

## 2020-10-02 DIAGNOSIS — R748 Abnormal levels of other serum enzymes: Secondary | ICD-10-CM | POA: Diagnosis present

## 2020-10-02 DIAGNOSIS — I130 Hypertensive heart and chronic kidney disease with heart failure and stage 1 through stage 4 chronic kidney disease, or unspecified chronic kidney disease: Secondary | ICD-10-CM | POA: Diagnosis present

## 2020-10-02 DIAGNOSIS — J9601 Acute respiratory failure with hypoxia: Secondary | ICD-10-CM | POA: Diagnosis not present

## 2020-10-02 LAB — TROPONIN I (HIGH SENSITIVITY)
Troponin I (High Sensitivity): 23 ng/L — ABNORMAL HIGH (ref <18)
Troponin I (High Sensitivity): 25 ng/L — ABNORMAL HIGH (ref <18)

## 2020-10-02 LAB — COMPREHENSIVE METABOLIC PANEL
ALT: 21 U/L (ref 0–44)
AST: 19 U/L (ref 15–41)
Albumin: 2.7 g/dL — ABNORMAL LOW (ref 3.5–5.0)
Alkaline Phosphatase: 397 U/L — ABNORMAL HIGH (ref 38–126)
Anion gap: 13 (ref 5–15)
BUN: 25 mg/dL — ABNORMAL HIGH (ref 8–23)
CO2: 28 mmol/L (ref 22–32)
Calcium: 9.6 mg/dL (ref 8.9–10.3)
Chloride: 95 mmol/L — ABNORMAL LOW (ref 98–111)
Creatinine, Ser: 1.8 mg/dL — ABNORMAL HIGH (ref 0.61–1.24)
GFR, Estimated: 35 mL/min — ABNORMAL LOW (ref >60)
Glucose, Bld: 180 mg/dL — ABNORMAL HIGH (ref 70–99)
Potassium: 4.2 mmol/L (ref 3.5–5.1)
Sodium: 136 mmol/L (ref 135–145)
Total Bilirubin: 2.4 mg/dL — ABNORMAL HIGH (ref 0.3–1.2)
Total Protein: 7.5 g/dL (ref 6.5–8.1)

## 2020-10-02 LAB — CBC
HCT: 41.8 % (ref 39.0–52.0)
Hemoglobin: 14.1 g/dL (ref 13.0–17.0)
MCH: 28.5 pg (ref 26.0–34.0)
MCHC: 33.7 g/dL (ref 30.0–36.0)
MCV: 84.6 fL (ref 80.0–100.0)
Platelets: 346 10*3/uL (ref 150–400)
RBC: 4.94 MIL/uL (ref 4.22–5.81)
RDW: 15.5 % (ref 11.5–15.5)
WBC: 17 10*3/uL — ABNORMAL HIGH (ref 4.0–10.5)
nRBC: 0 % (ref 0.0–0.2)

## 2020-10-02 LAB — RESP PANEL BY RT-PCR (FLU A&B, COVID) ARPGX2
Influenza A by PCR: NEGATIVE
Influenza B by PCR: NEGATIVE
SARS Coronavirus 2 by RT PCR: NEGATIVE

## 2020-10-02 LAB — LACTIC ACID, PLASMA
Lactic Acid, Venous: 2.9 mmol/L (ref 0.5–1.9)
Lactic Acid, Venous: 1.6 mmol/L (ref 0.5–1.9)

## 2020-10-02 LAB — LIPASE, BLOOD: Lipase: 42 U/L (ref 11–51)

## 2020-10-02 MED ORDER — FENTANYL CITRATE (PF) 100 MCG/2ML IJ SOLN
50.0000 ug | Freq: Once | INTRAMUSCULAR | Status: AC
  Administered 2020-10-02: 50 ug via INTRAVENOUS
  Filled 2020-10-02: qty 2

## 2020-10-02 MED ORDER — IOHEXOL 300 MG/ML  SOLN
75.0000 mL | Freq: Once | INTRAMUSCULAR | Status: AC | PRN
  Administered 2020-10-02: 75 mL via INTRAVENOUS

## 2020-10-02 MED ORDER — SODIUM CHLORIDE 0.9 % IV BOLUS
1000.0000 mL | Freq: Once | INTRAVENOUS | Status: AC
  Administered 2020-10-02: 1000 mL via INTRAVENOUS

## 2020-10-02 MED ORDER — PANTOPRAZOLE SODIUM 40 MG IV SOLR
40.0000 mg | Freq: Two times a day (BID) | INTRAVENOUS | Status: DC
  Administered 2020-10-02 – 2020-10-05 (×6): 40 mg via INTRAVENOUS
  Filled 2020-10-02 (×7): qty 40

## 2020-10-02 MED ORDER — LACTATED RINGERS IV SOLN: INTRAVENOUS | Status: AC

## 2020-10-02 MED ORDER — ONDANSETRON HCL 4 MG/2ML IJ SOLN
4.0000 mg | Freq: Once | INTRAMUSCULAR | Status: AC
  Administered 2020-10-02: 4 mg via INTRAVENOUS
  Filled 2020-10-02: qty 2

## 2020-10-02 MED ORDER — SODIUM CHLORIDE 0.9 % IV BOLUS
500.0000 mL | Freq: Once | INTRAVENOUS | Status: AC
  Administered 2020-10-02: 500 mL via INTRAVENOUS

## 2020-10-02 MED ORDER — DIATRIZOATE MEGLUMINE & SODIUM 66-10 % PO SOLN
90.0000 mL | Freq: Once | ORAL | Status: AC
  Administered 2020-10-02: 90 mL via NASOGASTRIC
  Filled 2020-10-02: qty 90

## 2020-10-02 NOTE — ED Notes (Signed)
Report given to RN receiving pt, questions answered.

## 2020-10-02 NOTE — ED Notes (Signed)
Patient transported to X-ray 

## 2020-10-02 NOTE — ED Notes (Signed)
Attempted to give report. Staff on floor stated their paging system isn't working so that wanted to look the patient up and asked if I could call back in 10 minutes.

## 2020-10-02 NOTE — ED Triage Notes (Signed)
Reports generalized abd pain, vomiting, and constipation x 1-2 months.
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
## 2020-10-03 ENCOUNTER — Observation Stay (HOSPITAL_COMMUNITY): Payer: Medicare (Managed Care)

## 2020-10-03 DIAGNOSIS — N179 Acute kidney failure, unspecified: Secondary | ICD-10-CM | POA: Diagnosis present

## 2020-10-03 DIAGNOSIS — I130 Hypertensive heart and chronic kidney disease with heart failure and stage 1 through stage 4 chronic kidney disease, or unspecified chronic kidney disease: Secondary | ICD-10-CM | POA: Diagnosis present

## 2020-10-03 DIAGNOSIS — R578 Other shock: Secondary | ICD-10-CM | POA: Diagnosis not present

## 2020-10-03 DIAGNOSIS — I4901 Ventricular fibrillation: Secondary | ICD-10-CM | POA: Diagnosis not present

## 2020-10-03 DIAGNOSIS — F0150 Vascular dementia without behavioral disturbance: Secondary | ICD-10-CM | POA: Diagnosis present

## 2020-10-03 DIAGNOSIS — I472 Ventricular tachycardia: Secondary | ICD-10-CM | POA: Diagnosis not present

## 2020-10-03 DIAGNOSIS — I5022 Chronic systolic (congestive) heart failure: Secondary | ICD-10-CM | POA: Diagnosis present

## 2020-10-03 DIAGNOSIS — E1169 Type 2 diabetes mellitus with other specified complication: Secondary | ICD-10-CM | POA: Diagnosis present

## 2020-10-03 DIAGNOSIS — E872 Acidosis: Secondary | ICD-10-CM | POA: Diagnosis present

## 2020-10-03 DIAGNOSIS — K56600 Partial intestinal obstruction, unspecified as to cause: Secondary | ICD-10-CM | POA: Diagnosis not present

## 2020-10-03 DIAGNOSIS — K56609 Unspecified intestinal obstruction, unspecified as to partial versus complete obstruction: Secondary | ICD-10-CM | POA: Diagnosis present

## 2020-10-03 DIAGNOSIS — Z515 Encounter for palliative care: Secondary | ICD-10-CM | POA: Diagnosis not present

## 2020-10-03 DIAGNOSIS — R627 Adult failure to thrive: Secondary | ICD-10-CM | POA: Diagnosis present

## 2020-10-03 DIAGNOSIS — L89152 Pressure ulcer of sacral region, stage 2: Secondary | ICD-10-CM | POA: Diagnosis present

## 2020-10-03 DIAGNOSIS — Z66 Do not resuscitate: Secondary | ICD-10-CM | POA: Diagnosis not present

## 2020-10-03 DIAGNOSIS — J9601 Acute respiratory failure with hypoxia: Secondary | ICD-10-CM | POA: Diagnosis not present

## 2020-10-03 DIAGNOSIS — I468 Cardiac arrest due to other underlying condition: Secondary | ICD-10-CM | POA: Diagnosis not present

## 2020-10-03 DIAGNOSIS — K838 Other specified diseases of biliary tract: Secondary | ICD-10-CM | POA: Diagnosis present

## 2020-10-03 DIAGNOSIS — R778 Other specified abnormalities of plasma proteins: Secondary | ICD-10-CM | POA: Diagnosis not present

## 2020-10-03 DIAGNOSIS — E1122 Type 2 diabetes mellitus with diabetic chronic kidney disease: Secondary | ICD-10-CM | POA: Diagnosis present

## 2020-10-03 DIAGNOSIS — Z20822 Contact with and (suspected) exposure to covid-19: Secondary | ICD-10-CM | POA: Diagnosis present

## 2020-10-03 DIAGNOSIS — K5651 Intestinal adhesions [bands], with partial obstruction: Secondary | ICD-10-CM | POA: Diagnosis present

## 2020-10-03 DIAGNOSIS — N390 Urinary tract infection, site not specified: Secondary | ICD-10-CM | POA: Diagnosis present

## 2020-10-03 DIAGNOSIS — R64 Cachexia: Secondary | ICD-10-CM | POA: Diagnosis present

## 2020-10-03 DIAGNOSIS — I69354 Hemiplegia and hemiparesis following cerebral infarction affecting left non-dominant side: Secondary | ICD-10-CM | POA: Diagnosis not present

## 2020-10-03 DIAGNOSIS — I9589 Other hypotension: Secondary | ICD-10-CM | POA: Diagnosis not present

## 2020-10-03 DIAGNOSIS — R54 Age-related physical debility: Secondary | ICD-10-CM | POA: Diagnosis present

## 2020-10-03 LAB — COMPREHENSIVE METABOLIC PANEL
ALT: 18 U/L (ref 0–44)
AST: 14 U/L — ABNORMAL LOW (ref 15–41)
Albumin: 2.4 g/dL — ABNORMAL LOW (ref 3.5–5.0)
Alkaline Phosphatase: 347 U/L — ABNORMAL HIGH (ref 38–126)
Anion gap: 9 (ref 5–15)
BUN: 22 mg/dL (ref 8–23)
CO2: 30 mmol/L (ref 22–32)
Calcium: 9.2 mg/dL (ref 8.9–10.3)
Chloride: 101 mmol/L (ref 98–111)
Creatinine, Ser: 1.37 mg/dL — ABNORMAL HIGH (ref 0.61–1.24)
GFR, Estimated: 49 mL/min — ABNORMAL LOW (ref >60)
Glucose, Bld: 138 mg/dL — ABNORMAL HIGH (ref 70–99)
Potassium: 3.4 mmol/L — ABNORMAL LOW (ref 3.5–5.1)
Sodium: 140 mmol/L (ref 135–145)
Total Bilirubin: 1.7 mg/dL — ABNORMAL HIGH (ref 0.3–1.2)
Total Protein: 6.6 g/dL (ref 6.5–8.1)

## 2020-10-03 LAB — CBC WITH DIFFERENTIAL/PLATELET
Abs Immature Granulocytes: 0.05 10*3/uL (ref 0.00–0.07)
Basophils Absolute: 0 10*3/uL (ref 0.0–0.1)
Basophils Relative: 0 %
Eosinophils Absolute: 0 10*3/uL (ref 0.0–0.5)
Eosinophils Relative: 0 %
HCT: 38.3 % — ABNORMAL LOW (ref 39.0–52.0)
Hemoglobin: 12.8 g/dL — ABNORMAL LOW (ref 13.0–17.0)
Immature Granulocytes: 0 %
Lymphocytes Relative: 11 %
Lymphs Abs: 1.3 10*3/uL (ref 0.7–4.0)
MCH: 28.1 pg (ref 26.0–34.0)
MCHC: 33.4 g/dL (ref 30.0–36.0)
MCV: 84.2 fL (ref 80.0–100.0)
Monocytes Absolute: 0.9 10*3/uL (ref 0.1–1.0)
Monocytes Relative: 8 %
Neutro Abs: 9.3 10*3/uL — ABNORMAL HIGH (ref 1.7–7.7)
Neutrophils Relative %: 81 %
Platelets: 253 10*3/uL (ref 150–400)
RBC: 4.55 MIL/uL (ref 4.22–5.81)
RDW: 15.2 % (ref 11.5–15.5)
WBC: 11.5 10*3/uL — ABNORMAL HIGH (ref 4.0–10.5)
nRBC: 0 % (ref 0.0–0.2)

## 2020-10-03 LAB — GLUCOSE, CAPILLARY
Glucose-Capillary: 101 mg/dL — ABNORMAL HIGH (ref 70–99)
Glucose-Capillary: 110 mg/dL — ABNORMAL HIGH (ref 70–99)
Glucose-Capillary: 118 mg/dL — ABNORMAL HIGH (ref 70–99)
Glucose-Capillary: 66 mg/dL — ABNORMAL LOW (ref 70–99)
Glucose-Capillary: 98 mg/dL (ref 70–99)

## 2020-10-03 LAB — APTT
aPTT: 33 seconds (ref 24–36)
aPTT: 63 seconds — ABNORMAL HIGH (ref 24–36)

## 2020-10-03 LAB — HEMOGLOBIN A1C
Hgb A1c MFr Bld: 5.2 % (ref 4.8–5.6)
Mean Plasma Glucose: 102.54 mg/dL

## 2020-10-03 LAB — URINALYSIS, ROUTINE W REFLEX MICROSCOPIC
Bilirubin Urine: NEGATIVE
Glucose, UA: NEGATIVE mg/dL
Ketones, ur: 5 mg/dL — AB
Leukocytes,Ua: NEGATIVE
Nitrite: NEGATIVE
Protein, ur: 30 mg/dL — AB
RBC / HPF: >50 RBC/hpf — ABNORMAL HIGH (ref 0–5)
Specific Gravity, Urine: >1.046 — ABNORMAL HIGH (ref 1.005–1.030)
pH: 5 (ref 5.0–8.0)

## 2020-10-03 LAB — PROTIME-INR
INR: 1.6 — ABNORMAL HIGH (ref 0.8–1.2)
Prothrombin Time: 18.4 seconds — ABNORMAL HIGH (ref 11.4–15.2)

## 2020-10-03 LAB — BRAIN NATRIURETIC PEPTIDE: B Natriuretic Peptide: 209 pg/mL — ABNORMAL HIGH (ref 0.0–100.0)

## 2020-10-03 LAB — MAGNESIUM: Magnesium: 2.1 mg/dL (ref 1.7–2.4)

## 2020-10-03 LAB — HEPARIN LEVEL (UNFRACTIONATED): Heparin Unfractionated: >2.2 IU/mL — ABNORMAL HIGH (ref 0.30–0.70)

## 2020-10-03 MED ORDER — POTASSIUM CHLORIDE 10 MEQ/100ML IV SOLN
10.0000 meq | INTRAVENOUS | Status: AC
  Administered 2020-10-03 (×4): 10 meq via INTRAVENOUS
  Filled 2020-10-03 (×2): qty 100

## 2020-10-03 MED ORDER — ONDANSETRON HCL 4 MG PO TABS: 4.0000 mg | ORAL_TABLET | Freq: Four times a day (QID) | ORAL | Status: DC | PRN

## 2020-10-03 MED ORDER — INSULIN ASPART 100 UNIT/ML ~~LOC~~ SOLN
0.0000 [IU] | Freq: Four times a day (QID) | SUBCUTANEOUS | Status: DC
  Administered 2020-10-05: 1 [IU] via SUBCUTANEOUS

## 2020-10-03 MED ORDER — ONDANSETRON HCL 4 MG/2ML IJ SOLN: 4.0000 mg | Freq: Four times a day (QID) | INTRAMUSCULAR | Status: DC | PRN

## 2020-10-03 MED ORDER — HEPARIN (PORCINE) 25000 UT/250ML-% IV SOLN
1350.0000 [IU]/h | INTRAVENOUS | Status: DC
  Administered 2020-10-03: 1300 [IU]/h via INTRAVENOUS
  Administered 2020-10-03 – 2020-10-04 (×2): 1350 [IU]/h via INTRAVENOUS
  Filled 2020-10-03 (×3): qty 250

## 2020-10-03 MED ORDER — MORPHINE SULFATE (PF) 2 MG/ML IV SOLN
2.0000 mg | INTRAVENOUS | Status: DC | PRN
  Administered 2020-10-05: 2 mg via INTRAVENOUS
  Filled 2020-10-03: qty 1

## 2020-10-03 MED ORDER — ACETAMINOPHEN 325 MG PO TABS: 650.0000 mg | ORAL_TABLET | Freq: Four times a day (QID) | ORAL | Status: DC | PRN

## 2020-10-03 MED ORDER — DEXTROSE 50 % IV SOLN
INTRAVENOUS | Status: AC
  Administered 2020-10-04: 50 mL
  Filled 2020-10-03: qty 50

## 2020-10-03 MED ORDER — PHENOL 1.4 % MT LIQD: 1.0000 | OROMUCOSAL | Status: DC | PRN

## 2020-10-03 MED ORDER — ACETAMINOPHEN 650 MG RE SUPP: 650.0000 mg | Freq: Four times a day (QID) | RECTAL | Status: DC | PRN

## 2020-10-03 MED ORDER — MORPHINE SULFATE (PF) 2 MG/ML IV SOLN: 1.0000 mg | INTRAVENOUS | Status: DC | PRN

## 2020-10-03 NOTE — Progress Notes (Signed)
Informed Dr. Renford Dills of pressure injury stage 2 on sacral area and treatment.

## 2020-10-03 NOTE — Progress Notes (Signed)
Assessment of stage 2 pressure injury to the sacral area revealed 3 areas which were present on admission:   Mid coccyx 1.2 x 0.5 x 0.3 cm. Right side above coccyx area:  1.0 x 0.3 x 0.2 cm. Left side above coccyx:  1.5 x 0.4 x 0.2 cm. Placed xeroform gauze over each area and covered with sacral foam to be changed daily and PRN soiling.  Low air loss mattress ordered for pt and Bilateral prevalon boots for heels which are intact but dry and red.  SCDs placed on pt bilaterally.

## 2020-10-03 NOTE — Progress Notes (Signed)
Blair Hailey is first point of contact but is a Psychologist, occupational and so son may also be contacted.
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
## 2020-10-03 NOTE — Progress Notes (Signed)
ANTICOAGULATION CONSULT NOTE - Initial Consult  Pharmacy Consult for Heparin Indication: H/O VTE  No Known Allergies  Patient Measurements: Height: 5\' 7"  (170.2 cm) Weight: 132.5 kg (292 lb 1.8 oz) IBW/kg (Calculated) : 66.1 Heparin Dosing Weight: 95 kg  Vital Signs: Temp: 97.7 F (36.5 C) (03/27 0216) Temp Source: Oral (03/27 0216) BP: 109/78 (03/27 0216) Pulse Rate: 89 (03/27 0216)  Labs: Recent Labs    09/25/2020 1718 09/09/2020 2121 10/03/20 0106  HGB 14.1  --  12.8*  HCT 41.8  --  38.3*  PLT 346  --  253  APTT  --   --  33  LABPROT  --   --  18.4*  INR  --   --  1.6*  HEPARINUNFRC  --   --  >2.20*  CREATININE 1.80*  --  1.37*  TROPONINIHS 23* 25*  --     Estimated Creatinine Clearance: 47 mL/min (A) (by C-G formula based on SCr of 1.37 mg/dL (H)).   Medical History: Past Medical History:  Diagnosis Date  . Acute pulmonary embolism without acute cor pulmonale (HCC) 03/30/2019  . AKI (acute kidney injury) (HCC) 05/18/2017  . Diabetes mellitus without complication (HCC)   . DVT (deep venous thrombosis) (HCC) 01/02/2020  . Hyperlipidemia   . Hypertension   . Left-sided weakness   . Stroke [facility])     Medications:  Medications Prior to Admission  Medication Sig Dispense Refill Last Dose  . apixaban (ELIQUIS) 5 MG TABS tablet Take 5 mg by mouth 2 (two) times daily.   09/14/2020 at 0800  . ferrous sulfate 325 (65 FE) MG tablet Take 325 mg by mouth daily.   Past Week at Unknown time  . midodrine (PROAMATINE) 10 MG tablet Take 10 mg by mouth 3 (three) times daily.   09/15/2020 at Unknown time  . tamsulosin (FLOMAX) 0.4 MG CAPS capsule Take 1 capsule (0.4 mg total) by mouth at bedtime. 30 capsule  10/01/2020 at Unknown time  . cetirizine (ZYRTEC) 10 MG tablet Take 10 mg by mouth daily as needed for allergies.   unknown  . feeding supplement, ENSURE ENLIVE, (ENSURE ENLIVE) LIQD Take 237 mLs by mouth 2 (two) times daily between meals. (Patient not taking: No sig reported)  237 mL 12 Completed Course at Unknown time  . hydrocerin (EUCERIN) CREA Apply 1 application topically 2 (two) times daily. Bilateral lower legs (Patient not taking: No sig reported) 113 g 0 Completed Course at Unknown time    Assessment: 87 y.o. male admitted with SBO, h/o DVT/PE and Eliquis on hold, for heparin  Goal of Therapy:  APTT 66-102 sec Heparin level 0.3-0.7 units/ml Monitor platelets by anticoagulation protocol: Yes   Plan:  Start heparin 1300 units/hr APTT in 8 hours  95 10/03/2020,3:05 AM

## 2020-10-03 NOTE — Progress Notes (Signed)
Gastrografin given via NG tube.  NG tube clamped for one hour.  Will reconnect to low intermittent wall suction in one hour.
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
## 2020-10-03 NOTE — Progress Notes (Signed)
Changed pt to [facility] bed. Prevalon boots placed on bilaterally.

## 2020-10-03 NOTE — Progress Notes (Signed)
Pt instructed on IS (got up to 1000) and he is coughing up large amounts of thick yellow phlegm.  NGT to low intermittent suction at 80. 900 cc dark green drainage overnight and 300 in the cannister at present.  Dr. Daphine Deutscher in and OKd some ice water or small amounts of icee.

## 2020-10-03 NOTE — Progress Notes (Signed)
Family states that pt has "[employer] insurance for Rx and they think is supplemental insurance also.  Terrell Ostrand is son and pt was living with him PTA.  His # is [phone] cell.  Long discussion with family about DC planning.  I explained the care plan for now and treatments pt is having at present.

## 2020-10-03 NOTE — Progress Notes (Signed)
Pt resting comfortably in sizewise bed.  Pressure Redistribution pad ordered and in room chair for when pt gets up OOB.  Fall Risk bag in room and yellow arm band on pt.  Son states that pt did get up some at home OOB to the bathroom but this had become infrequent.  Family desires pt to have rehab if possible after DC, pt has been to [facility], [facility] in the past and they are OK with these facilities.

## 2020-10-03 NOTE — Progress Notes (Signed)
ANTICOAGULATION CONSULT NOTE - Initial Consult  Pharmacy Consult for Heparin Indication: H/O VTE  No Known Allergies  Patient Measurements: Height: 5\' 7"  (170.2 cm) Weight: 59.5 kg (131 lb 2.8 oz) IBW/kg (Calculated) : 66.1 Heparin Dosing Weight: 95 kg  Vital Signs: Temp: 97.6 F (36.4 C) (03/27 0506) Temp Source: Oral (03/27 0506) BP: 110/80 (03/27 0506) Pulse Rate: 86 (03/27 0506)  Labs: Recent Labs    09/25/2020 1718 10/06/2020 2121 10/03/20 0106 10/03/20 1152  HGB 14.1  --  12.8*  --   HCT 41.8  --  38.3*  --   PLT 346  --  253  --   APTT  --   --  33 63*  LABPROT  --   --  18.4*  --   INR  --   --  1.6*  --   HEPARINUNFRC  --   --  >2.20*  --   CREATININE 1.80*  --  1.37*  --   TROPONINIHS 23* 25*  --   --     Estimated Creatinine Clearance: 30.2 mL/min (A) (by C-G formula based on SCr of 1.37 mg/dL (H)).     Assessment: 87 y.o. male admitted with SBO, h/o DVT/PE and Eliquis on hold, for heparin PTT this PM 63 seconds  Goal of Therapy:  APTT 66-102 sec Heparin level 0.3-0.7 units/ml Monitor platelets by anticoagulation protocol: Yes   Plan:  Increase heparin to 1350 units / hr Next PTT in AM  Thank you 95, PharmD  10/03/2020,1:16 PM

## 2020-10-03 NOTE — Plan of Care (Signed)
  Problem: Education: Goal: Knowledge of General Education information will improve Description Including pain rating scale, medication(s)/side effects and non-pharmacologic comfort measures Outcome: Progressing   

## 2020-10-04 ENCOUNTER — Inpatient Hospital Stay (HOSPITAL_COMMUNITY): Payer: Medicare (Managed Care)

## 2020-10-04 DIAGNOSIS — K56600 Partial intestinal obstruction, unspecified as to cause: Secondary | ICD-10-CM | POA: Diagnosis not present

## 2020-10-04 LAB — CBC
HCT: 40.5 % (ref 39.0–52.0)
Hemoglobin: 13.1 g/dL (ref 13.0–17.0)
MCH: 28.1 pg (ref 26.0–34.0)
MCHC: 32.3 g/dL (ref 30.0–36.0)
MCV: 86.9 fL (ref 80.0–100.0)
Platelets: 180 10*3/uL (ref 150–400)
RBC: 4.66 MIL/uL (ref 4.22–5.81)
RDW: 15.4 % (ref 11.5–15.5)
WBC: 10.3 10*3/uL (ref 4.0–10.5)
nRBC: 0 % (ref 0.0–0.2)

## 2020-10-04 LAB — BASIC METABOLIC PANEL
Anion gap: 10 (ref 5–15)
BUN: 18 mg/dL (ref 8–23)
CO2: 26 mmol/L (ref 22–32)
Calcium: 9.1 mg/dL (ref 8.9–10.3)
Chloride: 104 mmol/L (ref 98–111)
Creatinine, Ser: 1.13 mg/dL (ref 0.61–1.24)
GFR, Estimated: >60 mL/min (ref >60)
Glucose, Bld: 112 mg/dL — ABNORMAL HIGH (ref 70–99)
Potassium: 4.1 mmol/L (ref 3.5–5.1)
Sodium: 140 mmol/L (ref 135–145)

## 2020-10-04 LAB — GLUCOSE, CAPILLARY
Glucose-Capillary: 85 mg/dL (ref 70–99)
Glucose-Capillary: 75 mg/dL (ref 70–99)
Glucose-Capillary: 91 mg/dL (ref 70–99)

## 2020-10-04 LAB — URINE CULTURE: Culture: <10000 — AB

## 2020-10-04 LAB — APTT: aPTT: 69 seconds — ABNORMAL HIGH (ref 24–36)

## 2020-10-04 LAB — HEPARIN LEVEL (UNFRACTIONATED): Heparin Unfractionated: 1.24 IU/mL — ABNORMAL HIGH (ref 0.30–0.70)

## 2020-10-04 MED ORDER — DEXTROSE-NACL 5-0.9 % IV SOLN: INTRAVENOUS | Status: DC

## 2020-10-04 NOTE — Evaluation (Signed)
Occupational Therapy Evaluation Patient Details Name: Mark Bullock MRN: [id number] DOB: June 25, 1929 Today's Date: 10/04/2020    History of Present Illness Pt is a 87 y/o male admitted secondary to increased abdominal pain and vomiting. Found to have partial SBO and had NG tube placed. PMH includes dementia, CHF, DVT, CVA, PE, DM, and pressure ulcer on sacrum.   Clinical Impression   This 87 yo male admitted with above presents to acute OT with true PLOF unknown (pt with dementia and no family present). He currently is mod A -max A +2 for basic ADLs and mobility. He will continue to benefit from acute OT with follow up at SNF.    Follow Up Recommendations  SNF;Supervision/Assistance - 24 hour    Equipment Recommendations  Other (comment) (TBD next venue)       Precautions / Restrictions Precautions Precautions: Fall Precaution Comments: NG tube Restrictions Weight Bearing Restrictions: No      Mobility Bed Mobility Overal bed mobility: Needs Assistance Bed Mobility: Supine to Sit     Supine to sit: Mod assist   General bed mobility comments: Mod A for trunk and LE assist.    Transfers Overall transfer level: Needs assistance Equipment used: Rolling walker (2 wheeled) Transfers: Sit to/from [employer] Sit to Stand: Max assist;+2 physical assistance   Squat pivot transfers: Max assist;+2 physical assistance     General transfer comment: Tried sit<>stand, stand pivot with RW but pt could not move his feet once in standing    Balance Overall balance assessment: Needs assistance Sitting-balance support: No upper extremity supported;Feet supported Sitting balance-Leahy Scale: Fair Sitting balance - Comments: Reliant on min to mod A for sitting balance.   Standing balance support: Bilateral upper extremity supported Standing balance-Leahy Scale: Zero Standing balance comment: posterior lean                           ADL either  performed or assessed with clinical judgement   ADL Overall ADL's : Needs assistance/impaired Eating/Feeding: NPO   Grooming: Maximal assistance Grooming Details (indicate cue type and reason): seated in recliner Upper Body Bathing: Maximal assistance Upper Body Bathing Details (indicate cue type and reason): seated in recliner Lower Body Bathing: +2 for physical assistance;Maximal assistance Lower Body Bathing Details (indicate cue type and reason): +2 sit<>stand, +1 max A to maintain standing with +2 for washing Upper Body Dressing : Maximal assistance;Sitting Upper Body Dressing Details (indicate cue type and reason): seated in recliner Lower Body Dressing: +2 for physical assistance;Maximal assistance Lower Body Dressing Details (indicate cue type and reason): +2 sit<>stand, +1 max A to maintain standing with +2 for pulling up/down clothes Toilet Transfer: +2 for physical assistance;Squat-pivot;Maximal assistance Toilet Transfer Details (indicate cue type and reason): Bed to recliner on left Toileting- Clothing Manipulation and Hygiene: +2 for physical assistance;Maximal assistance Toileting - Clothing Manipulation Details (indicate cue type and reason): +2 sit<>stand, +1 max A to maintain standing with +2 for cleaning and clothing management             Vision Patient Visual Report: No change from baseline              Pertinent Vitals/Pain Pain Assessment: Faces Faces Pain Scale: No hurt Pain Location: generalized Pain Descriptors / Indicators: Grimacing;Guarding Pain Intervention(s): Limited activity within patient's tolerance;Monitored during session;Repositioned     Hand Dominance Left   Extremity/Trunk Assessment Upper Extremity Assessment Upper Extremity Assessment: Generalized weakness  Communication Communication Communication:  (talks in a low voice at times)   Cognition Arousal/Alertness: Awake/alert Behavior During Therapy: Flat affect Overall  Cognitive Status: No family/caregiver present to determine baseline cognitive functioning; pt with history of dementia.                                 General Comments: Followed 75% of one step commands              Home Living Family/patient expects to be discharged to:: Skilled nursing facility      Prior Functioning/Environment Level of Independence: Independent with assistive device(s)        Comments: Pt reports using a RW, but unsure of accuracy.        OT Problem List: Impaired balance (sitting and/or standing);Decreased cognition;Decreased safety awareness      OT Treatment/Interventions: Self-care/ADL training;DME and/or AE instruction;Patient/family education;Balance training    OT Goals(Current goals can be found in the care plan section) Acute Rehab OT Goals Patient Stated Goal: water OT Goal Formulation: Patient unable to participate in goal setting Time For Goal Achievement: 10/18/20 Potential to Achieve Goals: Fair  OT Frequency: Min 2X/week              AM-PAC OT "6 Clicks" Daily Activity     Outcome Measure Help from another person eating meals?: A Lot Help from another person taking care of personal grooming?: A Lot Help from another person toileting, which includes using toliet, bedpan, or urinal?: A Lot Help from another person bathing (including washing, rinsing, drying)?: A Lot Help from another person to put on and taking off regular upper body clothing?: A Lot Help from another person to put on and taking off regular lower body clothing?: A Lot 6 Click Score: 12   End of Session Equipment Utilized During Treatment: Gait belt;Rolling walker Nurse Communication: Mobility status;Need for lift equipment (+2 , sara stedy (RN and CNA()  Activity Tolerance: Patient tolerated treatment well Patient left: in chair;with call bell/phone within reach;with chair alarm set  OT Visit Diagnosis: Unsteadiness on feet (R26.81);Other  abnormalities of gait and mobility (R26.89);Muscle weakness (generalized) (M62.81);Other symptoms and signs involving cognitive function                Time: 0932-6712 OT Time Calculation (min): 45 min Charges:  OT General Charges $OT Visit: 1 Visit OT Evaluation $OT Eval Moderate Complexity: 1 Mod OT Treatments $Self Care/Home Management : 23-37 mins  Ignacia Palma, OTR/L Acute [employer] Pager [phone] Office [phone]     Evette Georges 10/04/2020, 6:06 PM
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
## 2020-10-04 NOTE — Progress Notes (Signed)
ANTICOAGULATION CONSULT NOTE - Follow Up Consult  Pharmacy Consult for Heparin Indication: h/o VTE  No Known Allergies  Patient Measurements: Height: 5\' 7"  (170.2 cm) Weight: 59.5 kg (131 lb 2.8 oz) IBW/kg (Calculated) : 66.1 Heparin Dosing Weight:  59.5 kg  Vital Signs: Temp: 97.9 F (36.6 C) (03/28 0527) Temp Source: Oral (03/28 0527) BP: 108/70 (03/28 0527) Pulse Rate: 91 (03/28 0527)  Labs: Recent Labs    10-25-20 1718 Oct 25, 2020 2121 10/03/20 0106 10/03/20 1152 10/04/20 0049 10/04/20 0818  HGB 14.1  --  12.8*  --  13.1  --   HCT 41.8  --  38.3*  --  40.5  --   PLT 346  --  253  --  180  --   APTT  --   --  33 63*  --  69*  LABPROT  --   --  18.4*  --   --   --   INR  --   --  1.6*  --   --   --   HEPARINUNFRC  --   --  >2.20*  --   --  1.24*  CREATININE 1.80*  --  1.37*  --  1.13  --   TROPONINIHS 23* 25*  --   --   --   --     Estimated Creatinine Clearance: 36.6 mL/min (by C-G formula based on SCr of 1.13 mg/dL).  Assessment: Anticoag: Eliquis PTA, Heparin for VTE history Hgb 13.1. Plsts 253>180. PTT 69 seconds in goal. Hep level 1.24 remains elevated from Eliquis interaction.  Goal of Therapy:  aPTT 66-102 seconds Monitor platelets by anticoagulation protocol: Yes   Plan:  Heparin at 1350 units / hr Con't daily HL, aPTT, and CBC   Nizar Cutler S. 10/06/20, PharmD, BCPS Clinical Staff Pharmacist [email or web address] Merilynn Finland, Aiyla Baucom Stillinger 10/04/2020,11:02 AM

## 2020-10-04 NOTE — Evaluation (Signed)
Physical Therapy Evaluation Patient Details Name: Mark Bullock MRN: [id number] DOB: 12/11/1928 Today's Date: 10/04/2020   History of Present Illness  Pt is a 87 y/o male admitted secondary to increased abdominal pain and vomiting. Found to have partial SBO and had NG tube placed. PMH includes dementia, CHF, DVT, CVA, PE, DM, and pressure ulcer on sacrum.  Clinical Impression  Pt admitted secondary to problem above with deficits below. Pt with increased agitation and requiring max encouragement for participation. Pt requiring mod A throughout bed mobility tasks. Pt refusing further mobility. Pt reports he lives alone and is at increased risk for falls. Recommend SNF level therapies at d/c to increase independence and safety with mobility. Will continue to follow acutely.     Follow Up Recommendations SNF;Supervision/Assistance - 24 hour    Equipment Recommendations  Other (comment) (TBD)    Recommendations for Other Services       Precautions / Restrictions Precautions Precautions: Fall Precaution Comments: NG tube Restrictions Weight Bearing Restrictions: No      Mobility  Bed Mobility Overal bed mobility: Needs Assistance Bed Mobility: Supine to Sit;Sit to Supine     Supine to sit: Mod assist Sit to supine: Mod assist   General bed mobility comments: Mod A for trunk and LE assist. Max encouragement to participate. Once sitting EOB, pt refusing further mobility and laying back down. Required assist for BLE to return to supine.    Transfers                    Ambulation/Gait                Stairs            Wheelchair Mobility    Modified Rankin (Stroke Patients Only)       Balance Overall balance assessment: Needs assistance Sitting-balance support: No upper extremity supported;Feet supported Sitting balance-Leahy Scale: Poor Sitting balance - Comments: Reliant on min to mod A for sitting balance.                                      Pertinent Vitals/Pain Pain Assessment: Faces Faces Pain Scale: Hurts little more Pain Location: generalized Pain Descriptors / Indicators: Grimacing;Guarding Pain Intervention(s): Limited activity within patient's tolerance;Monitored during session;Repositioned    Home Living Family/patient expects to be discharged to:: Skilled nursing facility                 Additional Comments: Per pt he lives with "the Good Lord and myself". Would not answer questions about home environment.    Prior Function Level of Independence: Independent with assistive device(s)         Comments: Pt reports using a RW, but unsure of accuracy.     Hand Dominance        Extremity/Trunk Assessment   Upper Extremity Assessment Upper Extremity Assessment: Generalized weakness    Lower Extremity Assessment Lower Extremity Assessment: Generalized weakness    Cervical / Trunk Assessment Cervical / Trunk Assessment: Kyphotic  Communication   Communication: No difficulties  Cognition Arousal/Alertness: Awake/alert Behavior During Therapy: Agitated Overall Cognitive Status: No family/caregiver present to determine baseline cognitive functioning                                 General Comments: Pt agitated that PT would not give  him water. Educated about NPO status and bowel issues, but pt getting agitated with PT. Not answering questions that PT asked.      General Comments      Exercises     Assessment/Plan    PT Assessment Patient needs continued PT services  PT Problem List Decreased strength;Decreased activity tolerance;Decreased mobility;Decreased balance;Decreased knowledge of use of DME;Decreased cognition;Decreased safety awareness;Decreased knowledge of precautions       PT Treatment Interventions DME instruction;Gait training;Functional mobility training;Therapeutic activities;Therapeutic exercise;Balance training;Patient/family education     PT Goals (Current goals can be found in the Care Plan section)  Acute Rehab PT Goals Patient Stated Goal: none stated PT Goal Formulation: With patient Time For Goal Achievement: 10/18/20 Potential to Achieve Goals: Fair    Frequency Min 2X/week   Barriers to discharge Decreased caregiver support      Co-evaluation               AM-PAC PT "6 Clicks" Mobility  Outcome Measure Help needed turning from your back to your side while in a flat bed without using bedrails?: A Lot Help needed moving from lying on your back to sitting on the side of a flat bed without using bedrails?: A Lot Help needed moving to and from a bed to a chair (including a wheelchair)?: A Lot Help needed standing up from a chair using your arms (e.g., wheelchair or bedside chair)?: A Lot Help needed to walk in hospital room?: A Lot Help needed climbing 3-5 steps with a railing? : Total 6 Click Score: 11    End of Session   Activity Tolerance: Treatment limited secondary to agitation Patient left: in bed;with call bell/phone within reach Nurse Communication: Mobility status PT Visit Diagnosis: Unsteadiness on feet (R26.81);Muscle weakness (generalized) (M62.81);Difficulty in walking, not elsewhere classified (R26.2)    Time: 5809-9833 PT Time Calculation (min) (ACUTE ONLY): 16 min   Charges:   PT Evaluation $PT Eval Moderate Complexity: 1 Mod          Farley Ly, PT, DPT  Acute Rehabilitation Services  Pager: [phone] Office: [phone]   Lehman Prom 10/04/2020, 4:59 PM
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
## 2020-10-05 ENCOUNTER — Inpatient Hospital Stay (HOSPITAL_COMMUNITY): Payer: Medicare (Managed Care)

## 2020-10-05 DIAGNOSIS — E872 Acidosis: Secondary | ICD-10-CM

## 2020-10-05 DIAGNOSIS — N1831 Chronic kidney disease, stage 3a: Secondary | ICD-10-CM

## 2020-10-05 DIAGNOSIS — E1122 Type 2 diabetes mellitus with diabetic chronic kidney disease: Secondary | ICD-10-CM

## 2020-10-05 DIAGNOSIS — N179 Acute kidney failure, unspecified: Secondary | ICD-10-CM | POA: Diagnosis not present

## 2020-10-05 DIAGNOSIS — R778 Other specified abnormalities of plasma proteins: Secondary | ICD-10-CM | POA: Diagnosis not present

## 2020-10-05 DIAGNOSIS — F0150 Vascular dementia without behavioral disturbance: Secondary | ICD-10-CM

## 2020-10-05 DIAGNOSIS — K56600 Partial intestinal obstruction, unspecified as to cause: Secondary | ICD-10-CM | POA: Diagnosis not present

## 2020-10-05 DIAGNOSIS — I5022 Chronic systolic (congestive) heart failure: Secondary | ICD-10-CM

## 2020-10-05 DIAGNOSIS — L89152 Pressure ulcer of sacral region, stage 2: Secondary | ICD-10-CM

## 2020-10-05 DIAGNOSIS — K56609 Unspecified intestinal obstruction, unspecified as to partial versus complete obstruction: Secondary | ICD-10-CM

## 2020-10-05 LAB — CBC
HCT: 31.4 % — ABNORMAL LOW (ref 39.0–52.0)
Hemoglobin: 9.5 g/dL — ABNORMAL LOW (ref 13.0–17.0)
MCH: 28.6 pg (ref 26.0–34.0)
MCHC: 30.3 g/dL (ref 30.0–36.0)
MCV: 94.6 fL (ref 80.0–100.0)
Platelets: 193 10*3/uL (ref 150–400)
RBC: 3.32 MIL/uL — ABNORMAL LOW (ref 4.22–5.81)
RDW: 15.7 % — ABNORMAL HIGH (ref 11.5–15.5)
WBC: 6.9 10*3/uL (ref 4.0–10.5)
nRBC: 0.9 % — ABNORMAL HIGH (ref 0.0–0.2)

## 2020-10-05 LAB — RENAL FUNCTION PANEL
Albumin: 1.6 g/dL — ABNORMAL LOW (ref 3.5–5.0)
Anion gap: 17 — ABNORMAL HIGH (ref 5–15)
BUN: 19 mg/dL (ref 8–23)
CO2: 21 mmol/L — ABNORMAL LOW (ref 22–32)
Calcium: 10.3 mg/dL (ref 8.9–10.3)
Chloride: 110 mmol/L (ref 98–111)
Creatinine, Ser: 1.74 mg/dL — ABNORMAL HIGH (ref 0.61–1.24)
GFR, Estimated: 37 mL/min — ABNORMAL LOW (ref >60)
Glucose, Bld: 101 mg/dL — ABNORMAL HIGH (ref 70–99)
Phosphorus: 5.9 mg/dL — ABNORMAL HIGH (ref 2.5–4.6)
Potassium: 3 mmol/L — ABNORMAL LOW (ref 3.5–5.1)
Sodium: 148 mmol/L — ABNORMAL HIGH (ref 135–145)

## 2020-10-05 LAB — GLUCOSE, CAPILLARY
Glucose-Capillary: 118 mg/dL — ABNORMAL HIGH (ref 70–99)
Glucose-Capillary: 137 mg/dL — ABNORMAL HIGH (ref 70–99)
Glucose-Capillary: 83 mg/dL (ref 70–99)

## 2020-10-05 LAB — MAGNESIUM: Magnesium: 2.2 mg/dL (ref 1.7–2.4)

## 2020-10-05 LAB — HEPATIC FUNCTION PANEL
ALT: 86 U/L — ABNORMAL HIGH (ref 0–44)
AST: 173 U/L — ABNORMAL HIGH (ref 15–41)
Albumin: 1.6 g/dL — ABNORMAL LOW (ref 3.5–5.0)
Alkaline Phosphatase: 202 U/L — ABNORMAL HIGH (ref 38–126)
Bilirubin, Direct: 0.8 mg/dL — ABNORMAL HIGH (ref 0.0–0.2)
Indirect Bilirubin: 1.1 mg/dL — ABNORMAL HIGH (ref 0.3–0.9)
Total Bilirubin: 1.9 mg/dL — ABNORMAL HIGH (ref 0.3–1.2)
Total Protein: 4.4 g/dL — ABNORMAL LOW (ref 6.5–8.1)

## 2020-10-05 LAB — MRSA PCR SCREENING: MRSA by PCR: NEGATIVE

## 2020-10-05 LAB — LACTIC ACID, PLASMA: Lactic Acid, Venous: >11 mmol/L (ref 0.5–1.9)

## 2020-10-05 LAB — TROPONIN I (HIGH SENSITIVITY): Troponin I (High Sensitivity): 34 ng/L — ABNORMAL HIGH (ref <18)

## 2020-10-05 MED ORDER — NALOXONE HCL 0.4 MG/ML IJ SOLN
INTRAMUSCULAR | Status: AC
  Filled 2020-10-05: qty 1

## 2020-10-05 MED ORDER — CHLORHEXIDINE GLUCONATE CLOTH 2 % EX PADS
6.0000 | MEDICATED_PAD | Freq: Every day | CUTANEOUS | Status: DC
  Administered 2020-10-05: 6 via TOPICAL

## 2020-10-05 MED ORDER — FENTANYL CITRATE (PF) 100 MCG/2ML IJ SOLN: 25.0000 ug | INTRAMUSCULAR | Status: DC | PRN

## 2020-10-05 MED ORDER — POTASSIUM CHLORIDE 10 MEQ/100ML IV SOLN
10.0000 meq | INTRAVENOUS | Status: AC
  Administered 2020-10-05 (×6): 10 meq via INTRAVENOUS
  Filled 2020-10-05 (×6): qty 100

## 2020-10-05 MED ORDER — NOREPINEPHRINE 4 MG/250ML-% IV SOLN
0.0000 ug/min | INTRAVENOUS | Status: DC
  Administered 2020-10-05: 30 ug/min via INTRAVENOUS
  Administered 2020-10-05 (×3): 40 ug/min via INTRAVENOUS
  Filled 2020-10-05 (×5): qty 250

## 2020-10-05 MED ORDER — DOCUSATE SODIUM 50 MG/5ML PO LIQD: 100.0000 mg | Freq: Two times a day (BID) | ORAL | Status: DC

## 2020-10-05 MED ORDER — CHLORHEXIDINE GLUCONATE 0.12% ORAL RINSE (MEDLINE KIT)
15.0000 mL | Freq: Two times a day (BID) | OROMUCOSAL | Status: DC
  Administered 2020-10-05: 15 mL via OROMUCOSAL

## 2020-10-05 MED ORDER — ORAL CARE MOUTH RINSE
15.0000 mL | OROMUCOSAL | Status: DC
  Administered 2020-10-05 (×2): 15 mL via OROMUCOSAL

## 2020-10-05 MED ORDER — SODIUM CHLORIDE 0.9 % IV SOLN
INTRAVENOUS | Status: DC | PRN
  Administered 2020-10-05: 1000 mL via INTRAVENOUS

## 2020-10-05 MED ORDER — POLYETHYLENE GLYCOL 3350 17 G PO PACK: 17.0000 g | PACK | Freq: Every day | ORAL | Status: DC

## 2020-10-08 NOTE — Progress Notes (Addendum)
Received call from  Central Monitor, patient's HR 40. Entered room, called patient's name, opened his eyes with shallow breathing then became unresponsive and pulseless.Code blue activated.

## 2020-10-08 NOTE — Progress Notes (Signed)
PCCM INTERVAL PROGRESS NOTE  I have had a discussion with the patient's daughter Para March and her husband regarding the events of tonight. They are aware he has suffered a cardiac arrest and the severity of this event on top of his chronic medical conditions including dementia, HFrEF 25%, and failure to thrive. They endorse DNR code status and no escalation of care at this point. We will continue to observe him on norepinephrine and the ventilator. We will continue to work up his arrest with labs and correct what we can.   Joneen Roach, AGACNP-BC [facility]  See Amion for personal pager PCCM on call pager [phone] until 7pm. Please call Elink 7p-7a. [phone]  09/27/2020 5:06 AM
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
## 2020-10-08 NOTE — Progress Notes (Addendum)
Blood pressure undetectable with auto BP cuff.  Informed Brooke, NP.  Unable to obtain Manual BP, Doppler BP is 50s.  Paitent remains on Levo 40.  Brooke, NP speaking with family.

## 2020-10-08 NOTE — Code Documentation (Addendum)
  Patient Name: Mark Bullock   MRN: [id number]   Date of Birth/ Sex: 06-19-1929 , male      Admission Date: 09/14/2020  Attending Provider: Burnadette Pop, MD  Primary Diagnosis: Partial small bowel obstruction (HCC)   Indication: Pt was in his usual state of health until this AM, when he was noted to be bradycardic in the 40 subsequently lost pulse and became unresponsive. Code blue was subsequently called. At the time of arrival on scene, ACLS protocol was underway.   Technical Description:  - CPR performance duration:  29 minutes  - Was defibrillation or cardioversion used? Yes   - Was external pacer placed? No  - Was patient intubated pre/post CPR? No   Medications Administered: Y = Yes; Blank = No Amiodarone  y  Atropine    Calcium  y  Epinephrine  y  Lidocaine    Magnesium  y  Norepinephrine    Phenylephrine    Sodium bicarbonate  y  Vasopressin     Post CPR evaluation:  - Final Status - Was patient successfully resuscitated ? Yes - What is current rhythm? Sinus  - What is current hemodynamic status? improved  Miscellaneous Information:  - Labs sent, including: CBC, RFP, lactic acid, ABG, troponin, mg, phos, abg  - Primary team notified?  Yes  - Family Notified? Yes, Daughter Para March on way to hospital  - Additional notes/ transfer status: Patient found brady several hours after morphine administration. Subsequently became unresponsive and acls started. Received 2 shocks for vfib and vtach, narcan administered. Obtained ROSC. Started on levo for pressure support.  Transported to 2H and care assumed by PCCM.       Quincy Simmonds, MD  10-21-20, 4:27 AM

## 2020-10-08 NOTE — Progress Notes (Signed)
Patient's Systolic is 58 by doppler. HR 76 RR 24. Family at bedside.

## 2020-10-08 NOTE — Plan of Care (Signed)
  Problem: Education: Goal: Knowledge of General Education information will improve Description Including pain rating scale, medication(s)/side effects and non-pharmacologic comfort measures Outcome: Progressing   

## 2020-10-08 NOTE — Progress Notes (Signed)
Dopplered Systolic pulse is 56.  Levo @ 40mg  IV.

## 2020-10-08 NOTE — Progress Notes (Signed)
  PCCM Interval Note  Remains on peripheral NE at 40 mcg/min with waxing and waning SBP from 60-90's Has not required any sedation  General:  Chronically ill, cachetic elderly male, now critically ill male in NAD intubated on MV HEENT: MM pink/moist, ETT/ OGT- small dark bilious output, pupils pinpoint Neuro: unresponsive to noxious stimuli, no gag, cough, or corneal reflex CV: rr PULM:  Non labored, mildly agonal breathing over set rate, coarse throughout GI: flat, no BS appreciated, condom cath- scant dark amber output Extremities: warm/dry (with bair hugger in place), no edema, very poor perfusion, unable to obtain pulse ox reading on multiple places Skin: no rashes or lesions  P:  Repeat lactate, troponin, and Hgb, add on LFTs ABG now Continue to closely monitor No escalation of care- including central access    Spoke with daughter, Para March and husband at bedside.  There are 6 other siblings coming from [place] and [place] arriving this afternoon.  Plan to continue current care and with additional labs and reassessment for plan when family arrives ( allow more time or transition to comfort care )   Addendum: 3154 notified by RN that cuff pressure does not read, barely dopplerable pulse.  Manual BP 40-50 by doppler.  Daughter and husband remains at bedside.  Advised family that patient is actively dying and will likely pass prior to all of family being able to arrive despite ongoing aggressive measures.  I have discussed with Dr. Denese Killings and both of [place] agree that adding any additional pressors would be futile at this point.  Ongoing emotional support for family.   CCT 45 mins  Posey Boyer, ACNP [facility] 10/06/2020, 9:23 AM

## 2020-10-08 NOTE — Progress Notes (Signed)
Per RN's note, pt was made DNR per famiy. Pt had deceased while on ventilator. Pt was terminally extubated by RN.

## 2020-10-08 NOTE — Clinical Note (Incomplete)
Patient having irregular heart rhythms.

## 2020-10-08 NOTE — Progress Notes (Signed)
Patient Made DNR prior by family. Family at bedside.  Patient went asystole.  Verified by 2 RN with absences of heart beat and vital signs.Dr. Denese Killings and Posey Boyer, PA informed of death.

## 2020-10-08 NOTE — Consult Note (Signed)
NAME:  Mark Bullock, MRN:  [id number], DOB:  22-Feb-1929, LOS: 2 ADMISSION DATE:  09/18/2020, CONSULTATION DATE:  10-24-20 REFERRING MD:  Burnadette Pop  CHIEF COMPLAINT:  Cardiac arrest   History of Present Illness:  87 year old male with PMHx significant for vascular dementia, T2DM, systolic CHF (LVEF 25% 12/2019), stroke/DVT/PE (on Eliquis), CKD stage IIIa, BPH who presented to [facility] ED 3/26 for abdominal pain, nausea and vomiting. Labs on admission notable for leukocytosis, AKI and lactic acidosis. CT A/P demonstrated partial SBO with transition point in the R midabdomen; surgery was consulted with plan for conservative management with NPO status/NGT.  Patient was clinically stable until early AM 3/29 when he became unresponsive; found to be bradycardic to 30s vs. PEA and CPR/ACLS was initiated. ROSC was achieved after 29 minutes (patient received 3 doses of Narcan, 4 doses of Epi, 3 doses of bicarb, 1 dose of amiodarone 300mg  and was shocked x 2 for VF then VT). Patient was intubated during this time and pressors were initiated. PCCM was consulted post-code for ICU admission.  Pertinent  Medical History   Past Medical History:  Diagnosis Date  . Acute pulmonary embolism without acute cor pulmonale (HCC) 03/30/2019  . AKI (acute kidney injury) (HCC) 05/18/2017  . Diabetes mellitus without complication (HCC)   . DVT (deep venous thrombosis) (HCC) 01/02/2020  . Hyperlipidemia   . Hypertension   . Left-sided weakness   . Stroke [facility])    Significant Hospital Events: Including procedures, antibiotic start and stop dates in addition to other pertinent events   . 3/26 - Presented to [facility] ED for abdominal pain, n/v - CT A/P demonstrated partial SBO with transition point, Surgery consult, opted for conservative management with NPO/NGT . 3/29 - Unresponsive, code called - brady/PEA, CPR with Epi x 4, bicarb x 3, narcan x 3, shocked x 2 for VF/VT; ROSC after 4/29, intubated and Levo started, PCCM  consult, transferred to ICU  Interim History / Subjective:   Objective   Blood pressure (!) 126/99, pulse (!) 48, temperature 97.8 F (36.6 C), resp. rate (!) 21, height 5\' 7"  (1.702 m), weight 59.5 kg, SpO2 (!) 89 %.        Intake/Output Summary (Last 24 hours) at Oct 24, 2020 0435 Last data filed at 10/24/2020 0430 Gross per 24 hour  Intake 1324.25 ml  Output 1765 ml  Net -440.75 ml   Filed Weights   10/03/20 0100 10/03/20 0800  Weight: 132.5 kg 59.5 kg   Physical Examination: General: Chronically ill-appearing elderly male in NAD. Cachectic. HEENT: [place]/AT, anicteric sclera, pupils pinpoint, dry mucous membranes; significant temporal/facial muscle wasting noted. Neuro: Comatose. Does not respond to verbal, tactile or noxious stimuli. Not following commands. CV: RRR, no m/g/r. PULM: Breathing even and mildly labored on vent (PEEP 5, FiO2 100%). Lung sounds c/w mechanical ventilation. GI: Soft, nontender, nondistended. Normoactive bowel sounds. Extremities: Gross muscle wasting noted. No LE edema. Skin changes c/w venous stasis. Skin: Warm/dry, no rashes noted. Stage III sacral wound described by RN.  Labs/imaging that I have personally reviewed  (right click and "Reselect all SmartList Selections" daily)  3/28AM labs: CBC unremarkable, BMP with Cr 1.1 (1.3), electrolytes WNL Repeat labs pending post-cardiac arrest  Resolved Hospital Problem list     Assessment & Plan:  Acute hypoxemic respiratory failure s/p cardiac arrest - Continue full vent support (6-8cc/kg IBW) - Wean FiO2 for O2 sat > 90% - Daily WUA/SBT - VAP bundle - Pulmonary hygiene - PAD protocol for sedation: Fentanyl  PRN, RASS goal 0 to -1  Hypotension s/p cardiac arrest - Continue Levophed - Goal MAP > 65 - No further escalation of pressors per family  Altered mental status s/p cardiac arrest Unresponsive post-cardiac arrest with rhythmic R arm motion c/f seizure activity vs. Posturing. - Obtain spot  EEG - RASS goal as above  History of DVT/PE History of LLE DVT 03/2019, on Eliquis prior to admission. - Continue heparin gtt for now -  Monitor aPTT  Partial small bowel obstruction Presented to [facility] ED 3/26 with abdominal pain, n/v. CT A/P demonstrated partial SBO with transition point in the R midabdomen; General Surgery consulted. Plan for conservative management. - NPO status - Continue NGT/OGT  Failure to thrive Physical deconditioning Prolonged poor PO intake as described in previous admission notes with subsequent FTT. - NPO currently for bowel rest in the setting of SBO - PT/OT consulted, following  Continue current plan of care without further escalation. Regarding details of GOC discussion, please see note by Joneen Roach, PCCM NP dated 3/29 0500.  Best practice (right click and "Reselect all SmartList Selections" daily)  Diet:  NPO Pain/Anxiety/Delirium protocol (if indicated): Yes (RASS goal -1) VAP protocol (if indicated): Yes DVT prophylaxis: Systemic AC GI prophylaxis: PPI Glucose control:  SSI Yes Central venous access:  N/A Arterial line:  N/A Foley:  Yes, and it is still needed Mobility:  bed rest  PT consulted: Yes Last date of multidisciplinary goals of care discussion [3/29] Code Status:  DNR Disposition: ICU  Labs   CBC: Recent Labs  Lab 10/07/2020 1718 10/03/20 0106 10/04/20 0049  WBC 17.0* 11.5* 10.3  NEUTROABS  --  9.3*  --   HGB 14.1 12.8* 13.1  HCT 41.8 38.3* 40.5  MCV 84.6 84.2 86.9  PLT 346 253 180    Basic Metabolic Panel: Recent Labs  Lab 09/28/2020 1718 10/03/20 0106 10/04/20 0049  NA 136 140 140  K 4.2 3.4* 4.1  CL 95* 101 104  CO2 28 30 26   GLUCOSE 180* 138* 112*  BUN 25* 22 18  CREATININE 1.80* 1.37* 1.13  CALCIUM 9.6 9.2 9.1  MG  --  2.1  --    GFR: Estimated Creatinine Clearance: 36.6 mL/min (by C-G formula based on SCr of 1.13 mg/dL). Recent Labs  Lab 09/28/2020 1718 09/19/2020 1808 10/06/2020 2121 10/03/20 0106  10/04/20 0049  WBC 17.0*  --   --  11.5* 10.3  LATICACIDVEN  --  2.9* 1.6  --   --     Liver Function Tests: Recent Labs  Lab 09/21/2020 1718 10/03/20 0106  AST 19 14*  ALT 21 18  ALKPHOS 397* 347*  BILITOT 2.4* 1.7*  PROT 7.5 6.6  ALBUMIN 2.7* 2.4*   Recent Labs  Lab 09/30/2020 1718  LIPASE 42   No results for input(s): AMMONIA in the last 168 hours.  ABG    Component Value Date/Time   HCO3 20.4 01/02/2020 0951   TCO2 21 (L) 01/02/2020 0951   ACIDBASEDEF 4.0 (H) 01/02/2020 0951   O2SAT 69.0 01/02/2020 0951     Coagulation Profile: Recent Labs  Lab 10/03/20 0106  INR 1.6*    Cardiac Enzymes: No results for input(s): CKTOTAL, CKMB, CKMBINDEX, TROPONINI in the last 168 hours.  HbA1C: Hgb A1c MFr Bld  Date/Time Value Ref Range Status  10/03/2020 01:06 AM 5.2 4.8 - 5.6 % Final    Comment:    (NOTE) Pre diabetes:          5.7%-6.4%  Diabetes:              >6.4%  Glycemic control for   <7.0% adults with diabetes   09/21/2019 05:57 AM 5.3 4.8 - 5.6 % Final    Comment:    (NOTE) Pre diabetes:          5.7%-6.4% Diabetes:              >6.4% Glycemic control for   <7.0% adults with diabetes     CBG: Recent Labs  Lab 10/04/20 0524 10/04/20 1126 10/04/20 1741 10-09-20 0006 10-09-20 0336  GLUCAP 85 75 91 137* 118*    Review of Systems:   Patient is encephalopathic and/or intubated. Therefore history has been obtained from chart review.   Past Medical History:  He,  has a past medical history of Acute pulmonary embolism without acute cor pulmonale (HCC) (03/30/2019), AKI (acute kidney injury) (HCC) (05/18/2017), Diabetes mellitus without complication (HCC), DVT (deep venous thrombosis) (HCC) (01/02/2020), Hyperlipidemia, Hypertension, Left-sided weakness, and Stroke (HCC).   Surgical History:   Past Surgical History:  Procedure Laterality Date  . APPENDECTOMY    . KNEE SURGERY       Social History:   reports that he has never smoked. He has  never used smokeless tobacco. He reports that he does not drink alcohol and does not use drugs.   Family History:  His Family history is unknown by patient.   Allergies No Known Allergies   Home Medications  Prior to Admission medications   Medication Sig Start Date End Date Taking? Authorizing Provider  apixaban (ELIQUIS) 5 MG TABS tablet Take 5 mg by mouth 2 (two) times daily.   Yes [provider]  ferrous sulfate 325 (65 FE) MG tablet Take 325 mg by mouth daily. 08/02/20  Yes [provider]  midodrine (PROAMATINE) 10 MG tablet Take 10 mg by mouth 3 (three) times daily.   Yes [provider]  tamsulosin (FLOMAX) 0.4 MG CAPS capsule Take 1 capsule (0.4 mg total) by mouth at bedtime. 09/25/19  Yes Briant Cedar, MD  cetirizine (ZYRTEC) 10 MG tablet Take 10 mg by mouth daily as needed for allergies.    [provider]  feeding supplement, ENSURE ENLIVE, (ENSURE ENLIVE) LIQD Take 237 mLs by mouth 2 (two) times daily between meals. Patient not taking: No sig reported 04/01/19   Dhungel, Nishant, MD  hydrocerin (EUCERIN) CREA Apply 1 application topically 2 (two) times daily. Bilateral lower legs Patient not taking: No sig reported 04/01/19   Eddie North, MD   Critical care time: 50 minutes   Tim Lair, [place] [facility] 2020/10/09 4:36 AM  Please see [email or web address] for pager details.

## 2020-10-08 NOTE — Progress Notes (Signed)
Patient remains unresponsive.  Systolic blood pressure 50 with Levo @ 40mg .  HR now 67.  RR 22.  Doppler femoral, radial and pedal pulses.

## 2020-10-08 NOTE — Progress Notes (Signed)
Code Blue-Patient transferred to 2 H 18 Family was called and may be coming to hospital. Phebe Colla, Chaplain   09/22/2020 0400  Clinical Encounter Type  Visited With Patient not available  Referral From Care management  Consult/Referral To Chaplain

## 2020-10-08 DEATH — deceased

## 2020-10-10 MED FILL — Medication: Qty: 1 | Status: AC
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
